# Patient Record
Sex: Female | Born: 1940 | Race: Black or African American | Hispanic: No | Marital: Single | Smoking: Never smoker
Health system: Southern US, Community
[De-identification: ages and names within clinical notes are randomized; demographics above are authoritative.]

## PROBLEM LIST (undated history)

## (undated) DIAGNOSIS — K449 Diaphragmatic hernia without obstruction or gangrene: Secondary | ICD-10-CM

## (undated) DIAGNOSIS — K219 Gastro-esophageal reflux disease without esophagitis: Secondary | ICD-10-CM

## (undated) DIAGNOSIS — R29898 Other symptoms and signs involving the musculoskeletal system: Secondary | ICD-10-CM

## (undated) DIAGNOSIS — I10 Essential (primary) hypertension: Secondary | ICD-10-CM

## (undated) DIAGNOSIS — C8330 Diffuse large B-cell lymphoma, unspecified site: Secondary | ICD-10-CM

## (undated) HISTORY — DX: Diffuse large B-cell lymphoma, unspecified site: C83.30

## (undated) HISTORY — PX: ABDOMINAL HYSTERECTOMY: SHX81

## (undated) HISTORY — DX: Other symptoms and signs involving the musculoskeletal system: R29.898

## (undated) HISTORY — PX: CYST EXCISION: SHX5701

## (undated) HISTORY — PX: CATARACT EXTRACTION: SUR2

---

## 2000-06-10 ENCOUNTER — Encounter (HOSPITAL_BASED_OUTPATIENT_CLINIC_OR_DEPARTMENT_OTHER): Payer: Self-pay | Admitting: General Surgery

## 2000-06-14 ENCOUNTER — Ambulatory Visit (HOSPITAL_COMMUNITY): Admission: RE | Admit: 2000-06-14 | Discharge: 2000-06-14 | Payer: Self-pay | Admitting: General Surgery

## 2000-06-14 ENCOUNTER — Encounter (INDEPENDENT_AMBULATORY_CARE_PROVIDER_SITE_OTHER): Payer: Self-pay | Admitting: Specialist

## 2001-12-11 ENCOUNTER — Encounter: Payer: Self-pay | Admitting: Family Medicine

## 2001-12-11 ENCOUNTER — Encounter: Admission: RE | Admit: 2001-12-11 | Discharge: 2001-12-11 | Payer: Self-pay | Admitting: Family Medicine

## 2002-10-02 ENCOUNTER — Encounter: Payer: Self-pay | Admitting: Family Medicine

## 2002-10-02 ENCOUNTER — Encounter: Admission: RE | Admit: 2002-10-02 | Discharge: 2002-10-02 | Payer: Self-pay | Admitting: Family Medicine

## 2003-04-18 ENCOUNTER — Ambulatory Visit (HOSPITAL_COMMUNITY): Admission: RE | Admit: 2003-04-18 | Discharge: 2003-04-19 | Payer: Self-pay | Admitting: Ophthalmology

## 2008-08-19 ENCOUNTER — Encounter: Admission: RE | Admit: 2008-08-19 | Discharge: 2008-08-19 | Payer: Self-pay | Admitting: Family Medicine

## 2010-05-29 NOTE — H&P (Signed)
NAME:  Ashley West, Ashley West                         ACCOUNT NO.:  [id number]   MEDICAL RECORD NO.:  [id number]                   PATIENT TYPE:  OIB   LOCATION:  5729                                 FACILITY:  [facility]   PHYSICIAN:  Guadelupe Sabin, M.D.             DATE OF BIRTH:  1940/12/02   DATE OF ADMISSION:  04/18/2003  DATE OF DISCHARGE:  04/19/2003                                HISTORY & PHYSICAL   This was an urgent outpatient admission of this 70 year old black female  admitted for retinal detachment surgery, left eye.   HISTORY OF PRESENT ILLNESS:  This patient had uneventful previous cataract  implant surgery performed over 10 years ago by Dr. Caleen Jobs in both  eyes.  The patient had no intraocular lens implant placed at that time.  The  patient did well with aphakic glasses.  The cataract surgery actually was  1972.  Recently over the week prior to admission, the patient noted the loss  of vision in the left eye, and a superior visual field defect.  The patient  was seen by Dr. Loraine Leriche T. Nile Riggs and Dr. Earlie Server and found to have a  retinal detachment of her aphakic left eye.  The patient was referred to my  office where examination confirmed this diagnosis.  Arrangements were made  for her outpatient admission.  The patient was given oral discussion and  printed information concerning the procedure and its possible complications.  She signed an informed consent.   PAST MEDICAL HISTORY:  The patient is under the care of Dr. Elsworth Soho  with:  1. Controlled hypertension.  2. Hiatal hernia.  3. Sinus trouble.  4. Chronic arthritis.  5. Allergies.   CURRENT MEDICATIONS:  1. Toprol.  2. Multivitamins.  3. Vitamin E and B complex.  4. She takes no aspirin regularly.   REVIEW OF SYSTEMS:  No cardiorespiratory complaints.   PHYSICAL EXAMINATION:  VITAL SIGNS:  As recorded on admission.  Blood  pressure 150/82.  Temperature 96.7.  Heart rate 71.  Respirations  18.  GENERAL APPEARANCE:  The patient is a pleasant, well-nourished, well-  developed, 70 year old black female in acute ocular distress.  HEENT:  Eyes:  Visual acuity recorded at 20/30, right eye.  Less than  20/400, left eye.  Slit lamp exam: The eyes are white and clear with a clear  cornea, deep and clear anterior chamber.  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  The left  eye reveals a thickened hyaloid with attachment to the pupillary border.  Two peripheral iridectomies are present in each eye at the 10 o'clock and 2  o'clock positions.  Applanation tonometry 15 mm, right eye, 17, left eye.  Detailed fundus examination, right eye, the vitreous is clear.  The retina  is attached with no retinal tear or retinal detachment surgery seen.  The  pupils of both eyes dilate poorly.  There is slight vitreous asteroid  hyalitis of the left eye.  There is a temporal and inferior retinal  detachment.  Due to the small pupil, it is difficult to see any definite  retinal tears.  The macular area is detached.  CHEST:  Lungs clear to percussion/auscultation.  HEART:  Normal sinus rhythm.  No cardiomegaly.  No murmurs.  ABDOMEN:  Negative.  EXTREMITIES:  Negative.   ADMISSION DIAGNOSES:  1. Rhegmatogenous retinal detachment, left eye.  2. Aphakia, left eye.   SURGICAL PLAN:  Sclerae buckling with possible vitrectomy.   The patient has been given oral discussion and printed information  concerning the procedure and its possible complications.  She signed an  informed consent, and arrangements were made for her outpatient admission at  this time.                                               Guadelupe Sabin, M.D.    HNJ/MEDQ  D:  04/19/2003  T:  04/20/2003  Job:  [id number]   cc:   Loraine Leriche T. Nile Riggs, M.D.  Fax: [phone]   L. Lupe Carney, M.D.  [address] [place]  [place] [address]  Fax: [phone]

## 2010-05-29 NOTE — Op Note (Signed)
[facility] [address]  Patient:    Ashley West, Ashley West                      MRN: [id number] Proc. Date: 06/14/00 Adm. 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  PROCEDURE:  Excision of lipomata:  (1) right elbow; (2) left axilla.  SURGEON:  Mardene Celeste. Lurene Shadow, M.D.  ASSISTANT:  Nurse.  ANESTHESIA:  MAC, 1% Xylocaine with epinephrine.  NOTE:  The patient is a 70 year old woman with enlarging lipomas located at the lateral aspect of the right elbow and at the posterior axillary line in the left axilla.  She is brought to the operating room for excision of these masses.  DESCRIPTION OF PROCEDURE:  Following the induction of satisfactory sedation, the patient is positioned supinely and the right arm and left axilla are prepped and draped to be included in the sterile operative field.  I infiltrated the region of the right elbow with 1% Xylocaine with epinephrine and made a transverse incision across the elbow, deepening this through the skin and subcutaneous tissue, down to the capsule of the lipoma, and the lipoma was dissected free from the surrounding soft tissue and carried all the way down to the fascia, where it was dissected free, removed in its entirety, and forwarded for pathologic evaluation.  Sponge and instrument counts were verified and the subcutaneous tissues closed with interrupted 3-0 Vicryl sutures.  The skin was closed with a running 5-0 Monocryl and reinforced with Steri-Strips.  Attention then turned to the left axilla, where again the region was infiltrated with 1% Xylocaine with epinephrine.  I made a transverse incision over the mass in the axilla, deepened this through the subcutaneous tissue down to the capsule of the mass.  The mass was dissected  free on all sides and removed in its entirety and forwarded for pathologic evaluation.  Hemostasis was assured with electrocautery.  Sponge, instrument, and sharp counts verified and the wound closed in layers with 3-0 Vicryl sutures in the subcutaneous layers and 5-0 Monocryl in the skin.  Both wounds were reinforced with Steri-Strips and sterile dressings applied.  Anesthetic reversed, the patient removed from the operating room to the recovery room in stable condition, having tolerated the procedure well. DD:  06/14/00 TD:  06/14/00 Job: [id number]

## 2010-05-29 NOTE — Discharge Summary (Signed)
NAME:  Ashley West, Ashley West                         ACCOUNT NO.:  [id number]   MEDICAL RECORD NO.:  [id number]                   PATIENT TYPE:  OIB   LOCATION:  5729                                 FACILITY:  [facility]   PHYSICIAN:  Guadelupe Sabin, M.D.             DATE OF BIRTH:  09/14/1940   DATE OF ADMISSION:  04/18/2003  DATE OF DISCHARGE:  04/19/2003                                 DISCHARGE SUMMARY   This was a planned urgent outpatient admission of this 70 year old black  female admitted with a rhegmatogenous retinal detachment of her left eye.  (See detailed admission history and physical.)   HOSPITAL COURSE:  The patient was evaluated preoperatively and felt to be in  satisfactory condition for the proposed surgery.  She, therefore, was taken  into the operating room where a sclerae buckling procedure was performed on  the left eye using solid silicone implants, #277 and #240, with drainage of  subretinal fluid and paracentesis.  The patient was given Diamox 500 mg  intravenously to further lower the intraocular pressure toward the end of  the procedure.  The patient tolerated the procedure well and was taken to  the recovery room and subsequently to the 23-hour Observation Unit.  The  patient was seen on the evening of surgery and felt to be doing well with  stable vital signs.  Slit lamp examination revealed a clear cornea,  moderately deep anterior chamber with the two peripheral iridectomies which  had been previously noted.  Applanation tonometry was elevated at 29 mm.  The patient was placed on Diamox every 4-6 hours and Betoptic ophthalmic  solution every 12 hours.  The patient was again seen on the following  morning, 04/19/2003, at which time slit lamp examination revealed shallowing  of the anterior chamber, iris of bombe', and elevated pressure at 35 mm.  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  It was felt that the patient  needed YAG laser opening of the thickened hyaloid to allow better  circulation of the intraocular fluids and opening of the angle.  The patient  was discharged to return to the office following her discharge from the  hospital for YAG laser surgery.   DISCHARGE DIAGNOSES:  1. Rhegmatogenous retinal detachment, left eye.  2. Pseudophakia, both eyes.  3. Postoperative acute pupillary block glaucoma.                                                Guadelupe Sabin, M.D.    HNJ/MEDQ  D:  04/19/2003  T:  04/20/2003  Job:  [id number]

## 2010-05-29 NOTE — Op Note (Signed)
NAME:  Ashley, West                         ACCOUNT NO.:  [id number]   MEDICAL RECORD NO.:  [id number]                   PATIENT TYPE:  OIB   LOCATION:  5729                                 FACILITY:  [facility]   PHYSICIAN:  Guadelupe Sabin, M.D.             DATE OF BIRTH:  November 22, 1940   DATE OF PROCEDURE:  04/18/2003  DATE OF DISCHARGE:  04/19/2003                                 OPERATIVE REPORT   PREOPERATIVE DIAGNOSES:  1. Rhegmatogenous retinal detachment, left eye.  2. Aphakia, left eye.   POSTOPERATIVE DIAGNOSES:  1. Rhegmatogenous retinal detachment, left eye.  2. Aphakia, left eye.   NAME OF OPERATION:  Scleral buckling procedure, left eye, using solid  silicone implants, #277 and #240, with diathermy application, external  drainage of subretinal fluid, and paracentesis.   SURGEON:  Guadelupe Sabin, M.D.   ASSISTANT:  Nurse.   ANESTHESIA:  General.   OPTHALMOSCOPY:  As previous described.  Ophthalmoscopy was difficult in this  patient due to the thickened hyaloid to the iris border which prevented  pupillary dilation.   OPERATIVE PROCEDURE:  The patient was given Diamox 500 mg intravenously at  the beginning of the operative procedure.  A peritomy was performed, 360  degrees adjacent to the limbus.  The subconjunctival tissue was cleaned.  The rectus muscles were looped with 4-0 silk traction sutures.  Using  indirect ophthalmoscopy and scleral depression, the retinal detached area  was noted from the 1:30 o'clock to 6 o'clock position.  It was elected,  however, to performed Lamellar's scleral dissection from the 12 o'clock to  7:30 o'clock position. The bed measured 9 mm in width.  Light diathermy  applications were applied to the inner sclerae lamella.  A #277 solid  silicone implant was trimmed and placed in the dissected bed from the 12  o'clock to 7:30 o'clock position.  A #240 solid silicone encircling band was  placed about the globe, tied with a suture  at the 10 o'clock position and an  anchoring suture at the 10:30 position.  After repeat indirect  ophthalmoscopy, it was noted that there was relatively little subretinal  fluid.  It was elected, however, to drain fluid at the 4 o'clock position.  The  corroid was exposed through a small slit incision in the inner sclerae  lamella.  It was treated with light diathermy and then perforated with the  pin electrode.  There was a small amount of subretinal fluid drainage.  The  sutures #5, 4-0 green Mersilene sutures, were pulled up covering the sclerae  implant.  The tension of the encircling band was adjusted.  As the sclerae  flaps were pulled up, the interocular pressure elevated, and paracentesis  was performed on two occasions to lower the interocular pressure.  The optic  nerve was intermittently examined to assure perfusion of the retina and  retinal blood vessels.  There did not appear to  be any retinal artery or  vein closure.  After waiting a few minutes for the pressure to be  stabilized, it was elected to close.  The sclerae flaps were secured  permanently.  The fundus was reinspected revealing good retinal artery  perfusion and flattening of the retina with a good buckling indentation on  the temporal side.  Tenon's capsule was pulled forward in the four quadrants  and tied as a separate layer.  The conjunctivae was then pulled forward and  closed with a running 6-0 chromic catgut suture.  Neosporin ophthalmic  solution had been irrigated over the implant and lamellar scleral dissected  area.  Depo-Garamycin and Depo-dexamethasone were injected in the subTenon's  space inferiorly.  Maxitrol and atropine ointment were instilled in the  conjunctival cul-de-sac.  A light patch and protective shield were applied.   DURATION OF PROCEDURE:  1-1/2 to 2 hours.   The patient tolerated the procedure well in general, left the operating room  for the recovery room in good condition.                                                Guadelupe Sabin, M.D.    HNJ/MEDQ  D:  04/19/2003  T:  04/20/2003  Job:  [id number]

## 2011-03-18 ENCOUNTER — Other Ambulatory Visit: Payer: Self-pay | Admitting: Family Medicine

## 2011-03-18 DIAGNOSIS — M546 Pain in thoracic spine: Secondary | ICD-10-CM

## 2011-03-18 DIAGNOSIS — R1012 Left upper quadrant pain: Secondary | ICD-10-CM

## 2011-03-19 ENCOUNTER — Ambulatory Visit
Admission: RE | Admit: 2011-03-19 | Discharge: 2011-03-19 | Disposition: A | Discharge status: Home / Self Care | Payer: PRIVATE HEALTH INSURANCE | Source: Ambulatory Visit | Attending: Family Medicine | Admitting: Family Medicine

## 2011-03-19 DIAGNOSIS — M546 Pain in thoracic spine: Secondary | ICD-10-CM

## 2011-03-19 DIAGNOSIS — R1012 Left upper quadrant pain: Secondary | ICD-10-CM

## 2011-03-19 MED ORDER — IOHEXOL 300 MG/ML  SOLN: 75.0000 mL | Freq: Once | INTRAMUSCULAR | Status: DC | PRN

## 2011-03-19 MED ORDER — IOHEXOL 300 MG/ML  SOLN
100.0000 mL | Freq: Once | INTRAMUSCULAR | Status: AC | PRN
  Administered 2011-03-19: 100 mL via INTRAVENOUS

## 2012-04-19 ENCOUNTER — Inpatient Hospital Stay (HOSPITAL_COMMUNITY)
Admission: EM | Admit: 2012-04-19 | Discharge: 2012-04-23 | DRG: 378 | Disposition: A | Discharge status: Home / Self Care | Payer: PRIVATE HEALTH INSURANCE | Attending: Internal Medicine | Admitting: Internal Medicine

## 2012-04-19 ENCOUNTER — Encounter (HOSPITAL_COMMUNITY): Payer: Self-pay | Admitting: Family Medicine

## 2012-04-19 DIAGNOSIS — K222 Esophageal obstruction: Secondary | ICD-10-CM | POA: Diagnosis present

## 2012-04-19 DIAGNOSIS — F1010 Alcohol abuse, uncomplicated: Secondary | ICD-10-CM | POA: Diagnosis present

## 2012-04-19 DIAGNOSIS — K449 Diaphragmatic hernia without obstruction or gangrene: Secondary | ICD-10-CM | POA: Diagnosis present

## 2012-04-19 DIAGNOSIS — K922 Gastrointestinal hemorrhage, unspecified: Secondary | ICD-10-CM | POA: Diagnosis present

## 2012-04-19 DIAGNOSIS — Z7982 Long term (current) use of aspirin: Secondary | ICD-10-CM

## 2012-04-19 DIAGNOSIS — K5731 Diverticulosis of large intestine without perforation or abscess with bleeding: Principal | ICD-10-CM | POA: Diagnosis present

## 2012-04-19 DIAGNOSIS — D62 Acute posthemorrhagic anemia: Secondary | ICD-10-CM | POA: Diagnosis present

## 2012-04-19 DIAGNOSIS — D649 Anemia, unspecified: Secondary | ICD-10-CM

## 2012-04-19 DIAGNOSIS — I10 Essential (primary) hypertension: Secondary | ICD-10-CM | POA: Diagnosis present

## 2012-04-19 DIAGNOSIS — Z79899 Other long term (current) drug therapy: Secondary | ICD-10-CM

## 2012-04-19 DIAGNOSIS — K219 Gastro-esophageal reflux disease without esophagitis: Secondary | ICD-10-CM | POA: Diagnosis present

## 2012-04-19 HISTORY — DX: Essential (primary) hypertension: I10

## 2012-04-19 LAB — COMPREHENSIVE METABOLIC PANEL
Alkaline Phosphatase: 63 U/L (ref 39–117)
BUN: 14 mg/dL (ref 6–23)
Calcium: 10.3 mg/dL (ref 8.4–10.5)
Creatinine, Ser: 0.81 mg/dL (ref 0.50–1.10)
GFR calc Af Amer: 83 mL/min — ABNORMAL LOW (ref >90)
Glucose, Bld: 139 mg/dL — ABNORMAL HIGH (ref 70–99)
Total Protein: 6.4 g/dL (ref 6.0–8.3)

## 2012-04-19 LAB — CBC
HCT: 30.5 % — ABNORMAL LOW (ref 36.0–46.0)
Hemoglobin: 10.6 g/dL — ABNORMAL LOW (ref 12.0–15.0)
MCH: 29 pg (ref 26.0–34.0)
MCHC: 34.8 g/dL (ref 30.0–36.0)
MCV: 83.3 fL (ref 78.0–100.0)
RDW: 14.7 % (ref 11.5–15.5)

## 2012-04-19 LAB — OCCULT BLOOD, POC DEVICE: Fecal Occult Bld: POSITIVE — AB

## 2012-04-19 MED ORDER — SODIUM CHLORIDE 0.9 % IV SOLN
INTRAVENOUS | Status: DC
  Administered 2012-04-19: 20:00:00 via INTRAVENOUS
  Administered 2012-04-20: 1000 mL via INTRAVENOUS
  Administered 2012-04-21: 01:00:00 via INTRAVENOUS

## 2012-04-19 MED ORDER — ACETAMINOPHEN 325 MG PO TABS: 650.0000 mg | ORAL_TABLET | Freq: Four times a day (QID) | ORAL | Status: DC | PRN

## 2012-04-19 MED ORDER — SODIUM CHLORIDE 0.9 % IV SOLN
INTRAVENOUS | Status: AC
  Administered 2012-04-19: 17:00:00 via INTRAVENOUS

## 2012-04-19 MED ORDER — LORAZEPAM 1 MG PO TABS: 1.0000 mg | ORAL_TABLET | Freq: Four times a day (QID) | ORAL | Status: AC | PRN

## 2012-04-19 MED ORDER — LORAZEPAM 2 MG/ML IJ SOLN: 1.0000 mg | Freq: Four times a day (QID) | INTRAMUSCULAR | Status: AC | PRN

## 2012-04-19 MED ORDER — ACETAMINOPHEN 650 MG RE SUPP: 650.0000 mg | Freq: Four times a day (QID) | RECTAL | Status: DC | PRN

## 2012-04-19 MED ORDER — PANTOPRAZOLE SODIUM 40 MG IV SOLR
40.0000 mg | Freq: Two times a day (BID) | INTRAVENOUS | Status: DC
  Administered 2012-04-19: 40 mg via INTRAVENOUS
  Filled 2012-04-19 (×3): qty 40

## 2012-04-19 MED ORDER — HYDROMORPHONE HCL PF 1 MG/ML IJ SOLN: 1.0000 mg | INTRAMUSCULAR | Status: DC | PRN

## 2012-04-19 MED ORDER — THIAMINE HCL 100 MG/ML IJ SOLN
100.0000 mg | Freq: Every day | INTRAMUSCULAR | Status: DC
  Filled 2012-04-19 (×5): qty 1

## 2012-04-19 MED ORDER — ADULT MULTIVITAMIN W/MINERALS CH
1.0000 | ORAL_TABLET | Freq: Every day | ORAL | Status: DC
  Administered 2012-04-19 – 2012-04-23 (×4): 1 via ORAL
  Filled 2012-04-19 (×5): qty 1

## 2012-04-19 MED ORDER — ONDANSETRON HCL 4 MG PO TABS: 4.0000 mg | ORAL_TABLET | Freq: Four times a day (QID) | ORAL | Status: DC | PRN

## 2012-04-19 MED ORDER — ONDANSETRON HCL 4 MG/2ML IJ SOLN: 4.0000 mg | Freq: Four times a day (QID) | INTRAMUSCULAR | Status: DC | PRN

## 2012-04-19 MED ORDER — ALUM & MAG HYDROXIDE-SIMETH 200-200-20 MG/5ML PO SUSP: 30.0000 mL | Freq: Four times a day (QID) | ORAL | Status: DC | PRN

## 2012-04-19 MED ORDER — VITAMIN B-1 100 MG PO TABS
100.0000 mg | ORAL_TABLET | Freq: Every day | ORAL | Status: DC
  Administered 2012-04-19 – 2012-04-23 (×4): 100 mg via ORAL
  Filled 2012-04-19 (×5): qty 1

## 2012-04-19 MED ORDER — LABETALOL HCL 5 MG/ML IV SOLN
10.0000 mg | Freq: Four times a day (QID) | INTRAVENOUS | Status: DC | PRN
  Filled 2012-04-19: qty 4

## 2012-04-19 MED ORDER — FOLIC ACID 1 MG PO TABS
1.0000 mg | ORAL_TABLET | Freq: Every day | ORAL | Status: DC
  Administered 2012-04-19 – 2012-04-23 (×4): 1 mg via ORAL
  Filled 2012-04-19 (×5): qty 1

## 2012-04-19 MED ORDER — SODIUM CHLORIDE 0.9 % IJ SOLN
3.0000 mL | Freq: Two times a day (BID) | INTRAMUSCULAR | Status: DC
  Administered 2012-04-21 – 2012-04-23 (×3): 3 mL via INTRAVENOUS

## 2012-04-19 MED ORDER — ONDANSETRON HCL 4 MG/2ML IJ SOLN: 4.0000 mg | Freq: Three times a day (TID) | INTRAMUSCULAR | Status: DC | PRN

## 2012-04-19 NOTE — ED Notes (Signed)
IV team unable to gain access. MD paged.

## 2012-04-19 NOTE — ED Provider Notes (Signed)
  I performed a history and physical examination of Ashley West and discussed her management with [name], PA_C.  I agree with the history, physical, assessment, and plan of care, with the following exceptions: None  I was present for the following procedures: None Time Spent in Critical Care of the patient: None Time spent in discussions with the patient and family: [age] y.o female with multiple episodes of rectal bleeding today.  Seen at clinic and noted to have decreased vital signs and told to come to ed.  Rectal positive for gross blood.  Patient hemodynamically stable and will be admitted to medicine with [facility] to consult.  [name], MD 04/19/12 [phone]

## 2012-04-19 NOTE — ED Notes (Signed)
Pt c/o bleeding when she has BM, started around 5am today, reports dark thick red bld. Pt think she has had hemorrhoids in the past, but sts she went to PCP and said he didn't see any external hemorrhoids. Pt denies n/v/d/pain. Pt in nad, ambulated to room with no issues, skin warm and dry, resp e/u.

## 2012-04-19 NOTE — Consult Note (Signed)
Referring Provider: Dr. Rod Can Primary Care Physician:  Benita Stabile, MD Primary Gastroenterologist:  Dr. Evette Cristal  Reason for Consultation:  GI bleed  HPI: Ashley West is a 72 y.o. female admitted through the emergency room this afternoon because of 5 episodes of painless hematochezia today, without significant hemodynamic instability. The stools were apparently somewhat maroon in color, not melenic.  There is moderate posthemorrhagic anemia with an initial hemoglobin of 10.6.   Previous CT scanning a year ago showed extensive diverticulosis.   She has never had a colonoscopy. (She was seen in the past by Dr. Evette Cristal and a colonoscopy was arranged, but she canceled it.)  The patient is on a daily 81 mg aspirin but does not have any chronic dyspeptic symptomatology. She has never had an ulcer.   Past Medical History  Diagnosis Date  . Hypertension     History reviewed. No pertinent past surgical history.  Prior to Admission medications   Medication Sig Start Date End Date Taking? Authorizing Provider  aspirin 81 MG chewable tablet Chew 81 mg by mouth daily.   Yes Historical Provider, MD  b complex vitamins tablet Take 1 tablet by mouth daily.   Yes Historical Provider, MD  losartan-hydrochlorothiazide (HYZAAR) 50-12.5 MG per tablet Take 1 tablet by mouth daily.   Yes Historical Provider, MD  Multiple Vitamin (MULTIVITAMIN WITH MINERALS) TABS Take 1 tablet by mouth daily.   Yes Historical Provider, MD  potassium gluconate 595 MG TABS Take 1,190 mg by mouth. Takes 2 tablets @ 99mg  equivalent each.   Yes Historical Provider, MD  vitamin C (ASCORBIC ACID) 250 MG tablet Take 250 mg by mouth daily.   Yes Historical Provider, MD  vitamin E (VITAMIN E) 400 UNIT capsule Take 400 Units by mouth daily.   Yes Historical Provider, MD    Current Facility-Administered Medications  Medication Dose Route Frequency Provider Last Rate Last Dose  . 0.9 %  sodium chloride infusion   Intravenous  STAT Hannah Muthersbaugh, PA-C 20 mL/hr at 04/19/12 1716    . 0.9 %  sodium chloride infusion   Intravenous Continuous Ripudeep Jenna Luo, MD 75 mL/hr at 04/19/12 1937    . acetaminophen (TYLENOL) tablet 650 mg  650 mg Oral Q6H PRN Ripudeep Jenna Luo, MD       Or  . acetaminophen (TYLENOL) suppository 650 mg  650 mg Rectal Q6H PRN Ripudeep Jenna Luo, MD      . alum & mag hydroxide-simeth (MAALOX/MYLANTA) 200-200-20 MG/5ML suspension 30 mL  30 mL Oral Q6H PRN Ripudeep K Rai, MD      . folic acid (FOLVITE) tablet 1 mg  1 mg Oral Daily Ripudeep K Rai, MD      . HYDROmorphone (DILAUDID) injection 1 mg  1 mg Intravenous Q4H PRN Ripudeep K Rai, MD      . labetalol (NORMODYNE,TRANDATE) injection 10 mg  10 mg Intravenous Q6H PRN Ripudeep K Rai, MD      . LORazepam (ATIVAN) tablet 1 mg  1 mg Oral Q6H PRN Ripudeep Jenna Luo, MD       Or  . LORazepam (ATIVAN) injection 1 mg  1 mg Intravenous Q6H PRN Ripudeep K Rai, MD      . multivitamin with minerals tablet 1 tablet  1 tablet Oral Daily Ripudeep K Rai, MD      . ondansetron (ZOFRAN) tablet 4 mg  4 mg Oral Q6H PRN Ripudeep Jenna Luo, MD       Or  . ondansetron (  ZOFRAN) injection 4 mg  4 mg Intravenous Q6H PRN Ripudeep K Rai, MD      . pantoprazole (PROTONIX) injection 40 mg  40 mg Intravenous Q12H Ripudeep K Rai, MD      . sodium chloride 0.9 % injection 3 mL  3 mL Intravenous Q12H Ripudeep K Rai, MD      . thiamine (VITAMIN B-1) tablet 100 mg  100 mg Oral Daily Ripudeep K Rai, MD       Or  . thiamine (B-1) injection 100 mg  100 mg Intravenous Daily Ripudeep Jenna Luo, MD        Allergies as of 04/19/2012  . (No Known Allergies)    History reviewed. No pertinent family history.  History   Social History  . Marital Status: Single    Spouse Name: N/A    Number of Children: N/A  . Years of Education: N/A   Occupational History  . Not on file.   Social History Main Topics  . Smoking status: Never Smoker   . Smokeless tobacco: Not on file  . Alcohol Use: Yes      Comment: everyday  . Drug Use: Not on file  . Sexually Active: Not on file   Other Topics Concern  . Not on file   Social History Narrative  . No narrative on file    Review of Systems: See history of present illness. Negative for anorexia or weight loss, in fact, has gained weight over the past year. She runs chronically constipated, with infrequent bowel movements. She does resort to occasional laxatives.   Physical Exam: Vital signs in last 24 hours: Temp:  [98 F (36.7 C)-98.3 F (36.8 C)] 98.3 F (36.8 C) (04/09 1957) Pulse Rate:  [68-101] 74 (04/09 1957) Resp:  [14-23] 20 (04/09 1957) BP: (128-182)/(52-74) 150/57 mmHg (04/09 1957) SpO2:  [98 %-100 %] 100 % (04/09 1957) Weight:  [61.2 kg (134 lb 14.7 oz)] 61.2 kg (134 lb 14.7 oz) (04/09 1848)   General:   Alert,  Well-developed, well-nourished, pleasant and cooperative in NAD Head:  Normocephalic and atraumatic. Eyes:  Sclera clear, no icterus.   Conjunctiva pale. Mouth:   No ulcerations or lesions.  Oropharynx pink & moist. Neck:   No masses or thyromegaly. Lungs:  Clear throughout to auscultation.   No wheezes, crackles, or rhonchi. No evident respiratory distress. Heart:   Regular rate and rhythm; no murmurs, clicks, rubs,  or gallops. Abdomen:  Soft, nontender, nontympanitic, and nondistended. No masses, hepatosplenomegaly or ventral hernias noted. Normal bowel sounds, without bruits, guarding, or rebound.   Msk:   Symmetrical without gross deformities. Extremities:   Without clubbing, cyanosis, or edema. Neurologic:  Alert and coherent;  grossly normal neurologically. Skin:  Intact without significant lesions or rashes. Cervical Nodes:  No significant cervical adenopathy. Psych:   Alert and cooperative. Normal mood and affect.  Intake/Output from previous day:   Intake/Output this shift: Total I/O In: 47 [I.V.:47] Out: -   Lab Results:  Recent Labs  04/19/12 1345  WBC 7.2  HGB 10.6*  HCT 30.5*  PLT  283   BMET  Recent Labs  04/19/12 1345  NA 132*  K 5.0  CL 99  CO2 28  GLUCOSE 139*  BUN 14  CREATININE 0.81  CALCIUM 10.3   LFT  Recent Labs  04/19/12 1345  PROT 6.4  ALBUMIN 3.5  AST 18  ALT 14  ALKPHOS 63  BILITOT 0.4   PT/INR No results found for this  basename: LABPROT, INR,  in the last 72 hours Studies/Results: No results found.  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  Plan: 1. Supportive care  2. I would favor endoscopic evaluation to confirm the absence of an upper tract source of bleeding, since she is on aspirin. The nature, purpose, and risks of the procedure were reviewed with the patient and her husband. However, at the moment, the patient is not sure if she wants the examination, so we will keep her n.p.o. after midnight and review that decision in the morning. 3. Fairly lengthy discussion with patient and her husband regarding the prognosis, causation, and natural history of diverticular bleeding, which they understand is our working diagnosis but not confirmed. 4. At some point, the patient should have colonoscopy. If she stops bleeding in the next couple of days, it would be prudent to defer that to an outpatient status, although based on her past track record, it is unclear whether she would comply with followup. If the bleeding persists, in-house colonoscopy would be worth considering, looking for a correctable cause of bleeding such as colonic ulcerations from her aspirin therapy, or vascular ectasia.   LOS: 0 days   Kanchan Gal V  04/19/2012, 8:03 PM

## 2012-04-19 NOTE — ED Notes (Signed)
Per Dr. Isidoro Donning pt is okay with just 1 IV.

## 2012-04-19 NOTE — ED Notes (Signed)
Paged IV team 

## 2012-04-19 NOTE — Progress Notes (Signed)
Pt arrived to unit via stretcher, IV.  Report received from [place], [place].  Salomon Mast, RN

## 2012-04-19 NOTE — ED Provider Notes (Signed)
History     CSN: [id number]  Arrival date & time 04/19/12  1231   First MD Initiated Contact with Patient 04/19/12 [phone]      Chief Complaint  Patient presents with  . Rectal Bleeding    (Consider location/radiation/quality/duration/timing/severity/associated sxs/prior treatment) Patient is a 72 y.o. female presenting with hematochezia. The history is provided by the patient, medical records and the spouse.  Rectal Bleeding  The current episode started today. The onset was sudden. The problem occurs occasionally. The problem has been gradually worsening. The patient is experiencing no pain. The stool is described as bloody. Pertinent negatives include no anorexia, no fever, no abdominal pain, no diarrhea, no hemorrhoids, no nausea, no rectal pain, no vomiting, no hematuria, no vaginal bleeding, no chest pain, no headaches, no coughing and no rash. She has been behaving normally. She has been eating and drinking normally. Urine output has been normal. The last void occurred less than 6 hours ago. Her past medical history does not include Hirschsprung's disease, inflammatory bowel disease or recent abdominal injury. There were no sick contacts. Recently, medical care has been given by the PCP. Services received include one or more referrals.    Ashley West is a 72 y.o. female  with a hx of HTN, hiatal hernia presents to the Emergency Department complaining of gradual, persistent, progressively worsening rectal bleeding onset 5 am. Pt constipated since Monday.  Saw PCP this AM who made an appointment with GI, but she continued to have bleeding and presented to the ER.  Associated symptoms include weakness, lightheaded.  Pr denies pain with BM.  Pt has never has a colonoscopt.  Nothing makes it better and nothing makes it worse. Pt has had a total of 5 BMs, all bloody.  Initially with BRB, but now dark red with clots. Pt denies fever, chills, headache, neck pain, chest pain, abdominal pain, nausea,  vomiting, syncope, dysuria.     Past Medical History  Diagnosis Date  . Hypertension     History reviewed. No pertinent past surgical history.  History reviewed. No pertinent family history.  History  Substance Use Topics  . Smoking status: Never Smoker   . Smokeless tobacco: Not on file  . Alcohol Use: Yes     Comment: everyday    OB History   Grav Para Term Preterm Abortions TAB SAB Ect Mult Living                  Review of Systems  Constitutional: Negative for fever, diaphoresis, appetite change, fatigue and unexpected weight change.  HENT: Negative for mouth sores and neck stiffness.   Eyes: Negative for visual disturbance.  Respiratory: Negative for cough, chest tightness, shortness of breath and wheezing.   Cardiovascular: Negative for chest pain.  Gastrointestinal: Positive for blood in stool, hematochezia and anal bleeding. Negative for nausea, vomiting, abdominal pain, diarrhea, constipation, rectal pain, anorexia and hemorrhoids.  Endocrine: Negative for polydipsia, polyphagia and polyuria.  Genitourinary: Negative for dysuria, urgency, frequency, hematuria and vaginal bleeding.  Musculoskeletal: Negative for back pain.  Skin: Negative for rash.  Allergic/Immunologic: Negative for immunocompromised state.  Neurological: Negative for syncope, light-headedness and headaches.  Hematological: Does not bruise/bleed easily.  Psychiatric/Behavioral: Negative for sleep disturbance. The patient is not nervous/anxious.     Allergies  Review of patient's allergies indicates no known allergies.  Home Medications   Current Outpatient Rx  Name  Route  Sig  Dispense  Refill  . aspirin 81 MG chewable tablet  Oral   Chew 81 mg by mouth daily.         Marland Kitchen b complex vitamins tablet   Oral   Take 1 tablet by mouth daily.         Marland Kitchen losartan-hydrochlorothiazide (HYZAAR) 50-12.5 MG per tablet   Oral   Take 1 tablet by mouth daily.         . Multiple Vitamin  (MULTIVITAMIN WITH MINERALS) TABS   Oral   Take 1 tablet by mouth daily.         . potassium gluconate 595 MG TABS   Oral   Take 1,190 mg by mouth. Takes 2 tablets @ 99mg  equivalent each.         . vitamin C (ASCORBIC ACID) 250 MG tablet   Oral   Take 250 mg by mouth daily.         . vitamin E (VITAMIN E) 400 UNIT capsule   Oral   Take 400 Units by mouth daily.           BP 166/62  Pulse 101  Temp(Src) 98.3 F (36.8 C)  Resp 18  SpO2 98%  Physical Exam  Nursing note and vitals reviewed. Constitutional: She is oriented to person, place, and time. She appears well-developed and well-nourished. No distress.  HENT:  Head: Normocephalic and atraumatic.  Mouth/Throat: Oropharynx is clear and moist. No oropharyngeal exudate.  Eyes: Conjunctivae and EOM are normal. Pupils are equal, round, and reactive to light. No scleral icterus.  Neck: Normal range of motion. Neck supple.  Cardiovascular: Regular rhythm, S1 normal, S2 normal, normal heart sounds and intact distal pulses.  Tachycardia present.  Exam reveals no gallop and no friction rub.   No murmur heard. Pulses:      Radial pulses are 2+ on the right side, and 2+ on the left side.  Pulmonary/Chest: Effort normal and breath sounds normal. No respiratory distress. She has no wheezes. She has no rales. She exhibits no tenderness.  Abdominal: Soft. Bowel sounds are normal. She exhibits no distension and no mass. There is no tenderness. There is no rebound and no guarding.  Genitourinary: Rectal exam shows no external hemorrhoid, no internal hemorrhoid, no fissure, no mass, no tenderness and anal tone normal. Guaiac positive stool.  Musculoskeletal: Normal range of motion. She exhibits no edema and no tenderness.  Lymphadenopathy:    She has no cervical adenopathy.  Neurological: She is alert and oriented to person, place, and time. She exhibits normal muscle tone. Coordination normal.  Speech is clear and goal  oriented Moves extremities without ataxia  Skin: Skin is warm and dry. No rash noted. She is not diaphoretic. No erythema.  Psychiatric: She has a normal mood and affect.    ED Course  Procedures (including critical care time)  Labs Reviewed  CBC - Abnormal; Notable for the following:    RBC 3.66 (*)    Hemoglobin 10.6 (*)    HCT 30.5 (*)    All other components within normal limits  COMPREHENSIVE METABOLIC PANEL - Abnormal; Notable for the following:    Sodium 132 (*)    Glucose, Bld 139 (*)    GFR calc non Af Amer 72 (*)    GFR calc Af Amer 83 (*)    All other components within normal limits  OCCULT BLOOD, POC DEVICE - Abnormal; Notable for the following:    Fecal Occult Bld POSITIVE (*)    All other components within normal limits  TYPE AND  SCREEN  ABO/RH   No results found.   1. Lower GI bleed   2. Anemia       MDM  Ashley West presents with painless BRBPR.  Likely diverticulosis, but also considering diverticulitis, hemorrhoids, neoplasm, angiodysplasia of the colon and ischemic colitis.  Pt with mild anemia of 10.6, no leukocytosis.  Fecal occult grossly positive.  On Record review, CT abdomen on 03/19/2011 with Extensive colonic diverticulosis, without associated inflammatory changes.  VSS, tachycardic, pt alert oriented, NAD, nontoxic, nonseptic appearing.  Pt with persistent bleeding, anemia; will proceed with admission.  Dr. Margarita Grizzle was consulted, evaluated this patient with me and agrees with the plan.          Ashley Client Ausar Georgiou, PA-C 04/19/12 1721

## 2012-04-19 NOTE — H&P (Signed)
History and Physical       Hospital Admission Note Date: 04/19/2012  Patient name: Ashley West Medical record number: [id number] Date of birth: [birth year] Age: 72 y.o. Gender: female PCP: Benita Stabile, MD    Chief Complaint:  Rectal bleeding since morning  HPI: Patient is a 72 year old female with history of hypertension, hiatal hernia presented to ED for rectal bleeding that started this morning. History was obtained from the patient who stated that she woke up at 5 AM in the morning to use the bathroom and saw large amount of fresh blood in the toilet. She denied any abdominal pain, nausea, vomiting or any hematemesis. She denied any dizziness, lightheadedness or any syncopal episode. The patient subsequently had 2 more episodes of rectal bleeding. She went to see her PCP, Dr. Clovis Riley at 8:30 AM in the office. Dr. Clovis Riley arranged the GI appointment at 4 PM with Dr. Evette Cristal today but advised her to go to the ER if she has any more of the bleeding. Patient went home and had another episode of bleeding and subsequently presented to the ED and had another episode while waiting in ED. In total she had 5 episodes of rectal bleeding today. She described it as BRBPR in the morning however during the day became darker with clots. She takes baby aspirin every day however denies any NSAID use. In ED hemoglobin is 10.6 with hematocrit of 30.5, baseline Hb is 13.1 (Dr Marjorie Smolder looked up [place] records to find baseline Hb), currently stable but tachycardiac.     Review of Systems:  Constitutional: Denies fever, chills, diaphoresis, appetite change and fatigue.  HEENT: Denies photophobia, eye pain, redness, hearing loss, ear pain, congestion, sore throat, rhinorrhea, sneezing, mouth sores, trouble swallowing, neck pain, neck stiffness and tinnitus.   Respiratory: Denies SOB, DOE, cough, chest tightness,  and wheezing.   Cardiovascular: Denies chest  pain, palpitations and leg swelling.  Gastrointestinal:see HPI Genitourinary: Denies dysuria, urgency, frequency, hematuria, flank pain and difficulty urinating.  Musculoskeletal: Denies myalgias, back pain, joint swelling, arthralgias and gait problem.  Skin: Denies pallor, rash and wound.  Neurological: Denies dizziness, seizures, syncope, weakness, light-headedness, numbness and headaches.  Hematological: Denies adenopathy. Easy bruising, personal or family bleeding history  Psychiatric/Behavioral: Denies suicidal ideation, mood changes, confusion, nervousness, sleep disturbance and agitation  Past Medical History: Past Medical History  Diagnosis Date  . Hypertension    History reviewed. No pertinent past surgical history.  Medications: Prior to Admission medications   Medication Sig Start Date End Date Taking? Authorizing Provider  aspirin 81 MG chewable tablet Chew 81 mg by mouth daily.   Yes Historical Provider, MD  b complex vitamins tablet Take 1 tablet by mouth daily.   Yes Historical Provider, MD  losartan-hydrochlorothiazide (HYZAAR) 50-12.5 MG per tablet Take 1 tablet by mouth daily.   Yes Historical Provider, MD  Multiple Vitamin (MULTIVITAMIN WITH MINERALS) TABS Take 1 tablet by mouth daily.   Yes Historical Provider, MD  potassium gluconate 595 MG TABS Take 1,190 mg by mouth. Takes 2 tablets @ 99mg  equivalent each.   Yes Historical Provider, MD  vitamin C (ASCORBIC ACID) 250 MG tablet Take 250 mg by mouth daily.   Yes Historical Provider, MD  vitamin E (VITAMIN E) 400 UNIT capsule Take 400 Units by mouth daily.   Yes Historical Provider, MD    Allergies:  No Known Allergies  Social History:  reports that she has never smoked. She does not have any smokeless tobacco history on file.  She reports that  drinks alcohol. Her drug history is not on file. patient states that she drinks 3 beers every day.  Family History: History reviewed. No pertinent family  history.  Physical Exam: Blood pressure 166/62, pulse 101, temperature 98.3 F (36.8 C), resp. rate 18, SpO2 98.00%. General: Alert, awake, oriented x3, in no acute distress. HEENT: normocephalic, atraumatic, anicteric sclera, pink conjunctiva, pupils equal and reactive to light and accomodation, oropharynx clear Neck: supple, no masses or lymphadenopathy, no goiter, no bruits  Heart: Regular rate and rhythm, without murmurs, rubs or gallops. Lungs: Clear to auscultation bilaterally, no wheezing, rales or rhonchi. Abdomen: Soft, nontender, nondistended, positive bowel sounds, no masses. Extremities: No clubbing, cyanosis or edema with positive pedal pulses. Neuro: Grossly intact, no focal neurological deficits, strength 5/5 upper and lower extremities bilaterally Psych: alert and oriented x 3, normal mood and affect Skin: no rashes or lesions, warm and dry   LABS on Admission:  Basic Metabolic Panel:  Recent Labs Lab 04/19/12 1345  NA 132*  K 5.0  CL 99  CO2 28  GLUCOSE 139*  BUN 14  CREATININE 0.81  CALCIUM 10.3   Liver Function Tests:  Recent Labs Lab 04/19/12 1345  AST 18  ALT 14  ALKPHOS 63  BILITOT 0.4  PROT 6.4  ALBUMIN 3.5   No results found for this basename: LIPASE, AMYLASE,  in the last 168 hours No results found for this basename: AMMONIA,  in the last 168 hours CBC:  Recent Labs Lab 04/19/12 1345  WBC 7.2  HGB 10.6*  HCT 30.5*  MCV 83.3  PLT 283    Radiological Exams on Admission: No results found.  Assessment/Plan Principal Problem:   Anemia secondary to acute blood loss/GI bleed: Likely lower GI bleed due to diverticulosis however patient has a history of aspirin use. No fevers, abdominal pain, leukocytosis.  - Admit to step down due to active bleeding, H&H every 8 hours, type and screen, transfuse packed RBCs if Hb less than 8. - Patient had a CT abdomen done in 3/13 which had shown extensive colonic diverticulosis.  - Placed on N.p.o  status, IV fluids - GI consultation called and discussed with Dr. Marjorie Smolder, will evaluate patient in ED for further recommendations.  Active Problems:   HTN (hypertension) - Placed on labetalol PRN    alcohol abuse: Patient drinks 3 beers every night - Placed on CIWA scale and monitor for any acute withdrawals  DVT prophylaxis: SCDs  CODE STATUS: Full CODE STATUS  Further plan will depend as patient's clinical course evolves and further radiologic and laboratory data become available.   Time Spent on Admission: 1 hour  Kieryn Burtis M.D. [facility] 04/19/2012, 4:57 PM Pager: [phone]  If 7PM-7AM, please contact night-coverage [email or web address] Password [email or web address]

## 2012-04-19 NOTE — ED Notes (Signed)
Per pt rectal bleeding for a few days. sts bright red and dark. Significant amount. Denies abdominal pain, N,V. Denies blood thinners. sts weak

## 2012-04-19 NOTE — ED Notes (Signed)
Attempted to gain IV access unsuccessful, another RN will attempt.

## 2012-04-19 NOTE — ED Notes (Signed)
IV team at bedside 

## 2012-04-20 ENCOUNTER — Encounter (HOSPITAL_COMMUNITY)
Admission: EM | Disposition: A | Discharge status: Home / Self Care | Payer: Self-pay | Source: Home / Self Care | Attending: Internal Medicine

## 2012-04-20 ENCOUNTER — Encounter (HOSPITAL_COMMUNITY): Payer: Self-pay | Admitting: *Deleted

## 2012-04-20 DIAGNOSIS — I10 Essential (primary) hypertension: Secondary | ICD-10-CM

## 2012-04-20 DIAGNOSIS — K922 Gastrointestinal hemorrhage, unspecified: Secondary | ICD-10-CM

## 2012-04-20 DIAGNOSIS — D62 Acute posthemorrhagic anemia: Secondary | ICD-10-CM

## 2012-04-20 DIAGNOSIS — K5731 Diverticulosis of large intestine without perforation or abscess with bleeding: Principal | ICD-10-CM | POA: Diagnosis present

## 2012-04-20 HISTORY — PX: ESOPHAGOGASTRODUODENOSCOPY: SHX5428

## 2012-04-20 LAB — BASIC METABOLIC PANEL
BUN: 10 mg/dL (ref 6–23)
CO2: 26 mEq/L (ref 19–32)
Chloride: 104 mEq/L (ref 96–112)
Creatinine, Ser: 0.85 mg/dL (ref 0.50–1.10)
Glucose, Bld: 99 mg/dL (ref 70–99)

## 2012-04-20 LAB — CBC
Hemoglobin: 8.9 g/dL — ABNORMAL LOW (ref 12.0–15.0)
MCH: 29 pg (ref 26.0–34.0)
MCV: 85.7 fL (ref 78.0–100.0)
RBC: 3.07 MIL/uL — ABNORMAL LOW (ref 3.87–5.11)

## 2012-04-20 LAB — HEMOGLOBIN AND HEMATOCRIT, BLOOD: Hemoglobin: 8.3 g/dL — ABNORMAL LOW (ref 12.0–15.0)

## 2012-04-20 SURGERY — EGD (ESOPHAGOGASTRODUODENOSCOPY)
Anesthesia: Moderate Sedation

## 2012-04-20 MED ORDER — BUTAMBEN-TETRACAINE-BENZOCAINE 2-2-14 % EX AERO
INHALATION_SPRAY | CUTANEOUS | Status: DC | PRN
  Administered 2012-04-20: 1 via TOPICAL

## 2012-04-20 MED ORDER — MIDAZOLAM HCL 5 MG/ML IJ SOLN
INTRAMUSCULAR | Status: AC
  Filled 2012-04-20: qty 3

## 2012-04-20 MED ORDER — FENTANYL CITRATE 0.05 MG/ML IJ SOLN
INTRAMUSCULAR | Status: AC
Start: 2012-04-20 — End: 2012-04-20
  Filled 2012-04-20: qty 4

## 2012-04-20 MED ORDER — SODIUM CHLORIDE 0.9 % IV SOLN
INTRAVENOUS | Status: DC
  Administered 2012-04-20: 500 mL via INTRAVENOUS

## 2012-04-20 MED ORDER — MIDAZOLAM HCL 10 MG/2ML IJ SOLN
INTRAMUSCULAR | Status: DC | PRN
  Administered 2012-04-20 (×2): .5 mg via INTRAVENOUS
  Administered 2012-04-20: 1 mg via INTRAVENOUS

## 2012-04-20 NOTE — Op Note (Signed)
[facility] [address] [place], [address]   ENDOSCOPY PROCEDURE REPORT  PATIENT: Ashley West, Ashley West  MR#: [id number] BIRTHDATE: 07-26-1940 , 71  yrs. old GENDER: Female ENDOSCOPIST:Kelly Eisler, MD REFERRED BY:  Hospitalist (PCP = Dr. Lupe Carney) PROCEDURE DATE:  04/20/2012 PROCEDURE:      Upper endoscopy ASA CLASS: INDICATIONS:   hematochezia, felt most likely to be diverticular in origin, in a patient on daily low-dose aspirin MEDICATION:    Versed 2 mg IV (intentionally light sedation) TOPICAL ANESTHETIC:    Cetacaine spray  DESCRIPTION OF PROCEDURE:   the patient was brought from her hospital room to the [facility] cone endoscopy unit and, after time now, was sedated lightly with Versed 2 mg IV. The reason for light sedation was that the patient indicates that many years ago when she had an endoscopy by another physician, she "slept all day" and they had trouble waking her up. 2 facilitate patient comfort, we used the Pentax pediatric upper endoscope, which was passed under direct vision and entered the esophagus without significant difficulty. The vocal cords were not well seen.  The esophagus was normal except for a widely patent Schatzki's ring at the squamocolumnar junction, below which was a 2 cm hiatal hernia.  The stomach contained a small clear residual, and was free of any abnormalities such as ulcers, erosions, polyps, masses, or gastritis. A retroflexed view of the cardia was normal.  The pylorus, duodenal bulb, and second duodenum looked normal.  The scope was then removed from the patient. No biopsies were obtained. She tolerated the procedure quite well.     COMPLICATIONS: None  ENDOSCOPIC IMPRESSION:   1. No blood in the stomach or prospective bleeding site identified to account for patient's recent hematochezia, thereby supporting the clinical impression that this is a diverticular bleed  2. Small hiatal hernia  with Schatzki's ring  RECOMMENDATIONS:  1. treat as for diverticular hemorrhage, with periodic lab monitoring and supportive care.  2.would leave off aspirin for approximately 2 weeks  3. Since the patient has indicated that she simply does not want to have colonoscopic evaluation, I feel it is okay to provide her a regular diet at this time.    _______________________________ Rosalie DoctorBernette Redbird, MD 04/20/2012 11:18 AM    PATIENT NAME:  Ashley West, Ashley West MR#: [id number]

## 2012-04-20 NOTE — Progress Notes (Signed)
The patient's endoscopy went smoothly. We intentionally used very light sedation because of a prior history, many years ago, of prolonged sedation after an endoscopic procedure.  Today's exam was negative except for a minimal Schatzki's ring. Specifically, no prospective bleeding site was seen, thereby supporting our clinical impression that this is a diverticular bleed. The patient does admit to occasional dysphagia symptoms compatible with her Schatzki's ring, and I advised her and her husband that if these ever becomes sufficiently bothersome, she did have endoscopic dilatation.  I will start the patient on a solid diet, since she is declining Colonoscopic evaluation, and I will also stop her Protonix since there was no evidence of aspirin gastropathy.  Florencia Reasons, M.D. [phone]

## 2012-04-20 NOTE — Progress Notes (Signed)
No further bleeding.  Moderate drop in hgb overnight c/w equilibration.  Pt agreeable to egd this a.m., but still declines having a colonoscopy--she understands the risk of not doing (her brother in law has colon cancer) but she "just doesn't want it."  (Note:  She always refuses flu shots, also.)  Florencia Reasons, M.D. [phone]

## 2012-04-20 NOTE — Interval H&P Note (Signed)
History and Physical Interval Note:  04/20/2012 10:59 AM  Ashley West  has presented today for surgery, with the diagnosis of hematochezia  The various methods of treatment have been discussed with the patient and family. After consideration of risks, benefits and other options for treatment, the patient has consented to  Procedure(s): ESOPHAGOGASTRODUODENOSCOPY (EGD) (N/A) as a surgical intervention .  The patient's history has been reviewed, patient examined, no change in status, stable for surgery.  I have reviewed the patient's chart and labs.  Questions were answered to the patient's satisfaction.     Florencia Reasons

## 2012-04-20 NOTE — Progress Notes (Signed)
Brief Nutrition Note  Malnutrition Screening Tool result is inaccurate.  Pt not assessed by RD at this time.  Please consult if nutrition needs are identified.  Ibraheem Voris, MS RD LDN Clinical Inpatient Dietitian Pager: [phone] Weekend/After hours pager: [phone]  

## 2012-04-20 NOTE — Progress Notes (Signed)
[facility] Progress Note [place] TEAM 1 - Stepdown/ICU TEAM   KAVINA CANTAVE ZOX:[id number] DOB: 1940/04/29 DOA: 04/19/2012 PCP: Benita Stabile, MD  Brief narrative: 72 year old female patient with known diverticulosis on prior CT. She awakened on the morning of admission and upon going to the bathroom stall large amount of fresh red blood in the toilet. No nausea vomiting or abdominal pain reported. No dizziness chest pain shortness of breath et Karie Soda. She had 2 more episodes of rectal bleeding. She followed up with her primary care physician that morning. He arranged for outpatient gastroenterology evaluation on the same date at 4 PM. Before leaving the GI office she was instructed to report to the emergency department if she had additional bleeding. Unfortunately bleeding recurred and she presented to the emergency department. Bleeding at home was initially bright red in nature but had subsequently tapered off to dark with clots. Her hemoglobin was stable in the emergency department 10.6 from a baseline of 13.1 noting his baseline is able report from Dr. Donavan Burnet office. She was also tachycardic  Assessment/Plan: Active Problems:   GI bleed due Diverticulosis of colon with hemorrhage -GI following -EGD negative and pt declines colonoscopy -no bleeding since yesterday with only passage of melena and clots earlier this am -GI stopping Protonix since no evidence of ASA gastropathy -Advance diet to regular per GI    Acute blood loss anemia -baseline Hgb~13; 10 at presentation and today down to 9 -transfuse only if </= 7 -cycle CBC q 12hrs    HTN (hypertension) -BP soft so hold home meds -hold ASA for 2 weeks    Alcohol abuse -reports 3 beers daily so provide CIWA   DVT prophylaxis: SCDs Code Status: Full Family Communication: Patient Disposition Plan: Transfer to floor Isolation: None  Consultants: Gastroenterology  Procedures: EGD (4/10) ENDOSCOPIC  IMPRESSION:  1. No blood in the stomach or prospective bleeding site identified to account for patient's recent hematochezia, thereby supporting the clinical impression that this is a diverticular bleed  2. Small hiatal hernia with Schatzki's ring  RECOMMENDATIONS:  1. treat as for diverticular hemorrhage, with periodic lab  monitoring and supportive care.  2.would leave off aspirin for approximately 2 weeks  3. Since the patient has indicated that she simply does not want to have colonoscopic evaluation, I feel it is okay to provide her a regular diet at this time.  Antibiotics: None  HPI/Subjective: Patient denies abdominal pain, chest pain, shortness of breath or dizziness. Frustrated over monitoring devices and is eager to discharge home.   Objective: Blood pressure 122/62, pulse 65, temperature 97.7 F (36.5 C), temperature source Oral, resp. rate 21, height 5\' 1"  (1.549 m), weight 61.7 kg (136 lb 0.4 oz), SpO2 100.00%.  Intake/Output Summary (Last 24 hours) at 04/20/12 1319 Last data filed at 04/20/12 1300  Gross per 24 hour  Intake   1672 ml  Output      0 ml  Net   1672 ml     Exam: General: No acute respiratory distress Lungs: Clear to auscultation bilaterally without wheezes or crackles, RA Cardiovascular: Regular rate and rhythm without murmur gallop or rub normal S1 and S2, no peripheral edema or JVD Abdomen: Nontender, nondistended, soft, bowel sounds positive, no rebound, no ascites, no appreciable mass Musculoskeletal: No significant cyanosis, clubbing of bilateral lower extremities Neurological: Alert and oriented x 3, moves all extremities x 4 without focal neurological deficits, CN 2-13 intact  Data Reviewed: Basic Metabolic Panel:  Recent Labs Lab 04/19/12  1345 04/20/12 0316  NA 132* 134*  K 5.0 4.1  CL 99 104  CO2 28 26  GLUCOSE 139* 99  BUN 14 10  CREATININE 0.81 0.85  CALCIUM 10.3 9.3   Liver Function Tests:  Recent Labs Lab 04/19/12 1345   AST 18  ALT 14  ALKPHOS 63  BILITOT 0.4  PROT 6.4  ALBUMIN 3.5   No results found for this basename: LIPASE, AMYLASE,  in the last 168 hours No results found for this basename: AMMONIA,  in the last 168 hours CBC:  Recent Labs Lab 04/19/12 1345 04/19/12 1947 04/20/12 0316 04/20/12 1202  WBC 7.2  --   --   --   HGB 10.6* 9.6* 8.3* 9.0*  HCT 30.5* 27.7* 24.0* 27.6*  MCV 83.3  --   --   --   PLT 283  --   --   --    Cardiac Enzymes: No results found for this basename: CKTOTAL, CKMB, CKMBINDEX, TROPONINI,  in the last 168 hours BNP (last 3 results) No results found for this basename: PROBNP,  in the last 8760 hours CBG: No results found for this basename: GLUCAP,  in the last 168 hours  Recent Results (from the past 240 hour(s))  MRSA PCR SCREENING     Status: None   Collection Time    04/19/12  7:34 PM      Result Value Range Status   MRSA by PCR NEGATIVE  NEGATIVE Final   Comment:            The GeneXpert MRSA Assay (FDA     approved for NASAL specimens     only), is one component of a     comprehensive MRSA colonization     surveillance program. It is not     intended to diagnose MRSA     infection nor to guide or     monitor treatment for     MRSA infections.     Studies:  Recent x-ray studies have been reviewed in detail by the Attending Physician  Scheduled Meds:  Reviewed in detail by the Attending Physician   Junious Silk, ANP [facility] Office  [phone] Pager [phone]  On-Call/Text Page:      [email or web address]      password [email or web address]  If 7PM-7AM, please contact night-coverage [email or web address] Password [email or web address] 04/20/2012, 1:19 PM   LOS: 1 day   I have examined the patient, reviewed the chart and modified the above note which I agree with.   Damaya Channing,MD [phone] 04/20/2012, 4:25 PM

## 2012-04-21 DIAGNOSIS — F1010 Alcohol abuse, uncomplicated: Secondary | ICD-10-CM

## 2012-04-21 DIAGNOSIS — D649 Anemia, unspecified: Secondary | ICD-10-CM

## 2012-04-21 LAB — CBC
HCT: 22.3 % — ABNORMAL LOW (ref 36.0–46.0)
Hemoglobin: 7.6 g/dL — ABNORMAL LOW (ref 12.0–15.0)
MCHC: 34.1 g/dL (ref 30.0–36.0)
MCV: 85.8 fL (ref 78.0–100.0)
RDW: 15.3 % (ref 11.5–15.5)
WBC: 5.1 10*3/uL (ref 4.0–10.5)

## 2012-04-21 MED ORDER — LABETALOL HCL 5 MG/ML IV SOLN: 10.0000 mg | Freq: Four times a day (QID) | INTRAVENOUS | Status: DC | PRN

## 2012-04-21 MED ORDER — FUROSEMIDE 10 MG/ML IJ SOLN
40.0000 mg | Freq: Once | INTRAMUSCULAR | Status: AC
  Administered 2012-04-21: 40 mg via INTRAVENOUS
  Filled 2012-04-21: qty 4

## 2012-04-21 MED ORDER — FERROUS FUMARATE 325 (106 FE) MG PO TABS
1.0000 | ORAL_TABLET | Freq: Two times a day (BID) | ORAL | Status: DC
  Administered 2012-04-21 – 2012-04-23 (×5): 106 mg via ORAL
  Filled 2012-04-21 (×8): qty 1

## 2012-04-21 MED ORDER — PANTOPRAZOLE SODIUM 40 MG PO TBEC
40.0000 mg | DELAYED_RELEASE_TABLET | Freq: Every day | ORAL | Status: DC
  Administered 2012-04-21 – 2012-04-22 (×2): 40 mg via ORAL
  Filled 2012-04-21 (×2): qty 1

## 2012-04-21 NOTE — Progress Notes (Signed)
During blood administration patient began complaining of pain at her IV site. Blood stopped and IV team paged that patient needed new IV at 16:45.

## 2012-04-21 NOTE — Progress Notes (Signed)
Patient reported one time of small amount of blood in stool overnight, but states, "It is much better than when I first got here."  Nursing will continue to monitor.  Dr. Gwenlyn Perking aware, will see patient today.

## 2012-04-21 NOTE — Progress Notes (Addendum)
[facility] Progress Note    Ashley West ZOX:[id number] DOB: 12-Jun-1940 DOA: 04/19/2012 PCP: Benita Stabile, MD  Assessment/Plan:  GI bleed due Diverticulosis of colon with hemorrhage -GI following -EGD negative and pt declines colonoscopy -one more episode of bleeding overnight; Hgb down to 7.6 -per GI rec's no ASA for 2 weeks and no need for IV protonix. -continue current diet; patient reports no abdominal pain, nausea, vomiting and is at this point refusing colonoscopy.    Acute blood loss anemia -baseline Hgb~13; 10 at presentation and today down to 7.6 -still ongoing bleeding -will start ferrous sulfate -transfuse 1 unit and follow Hgb trend    HTN (hypertension) -BP soft and in settings of acute bleeding; will continue holding.    Alcohol abuse -reports 3 beers daily so will continue CIWA -no withdrawal -continue thiamine and folic acid   DVT prophylaxis: SCDs Code Status: Full Family Communication: no family at bedside Disposition Plan: home when medically stable  Consultants: Gastroenterology  Procedures: EGD (4/10) ENDOSCOPIC IMPRESSION:  1. No blood in the stomach or prospective bleeding site identified to account for patient's recent hematochezia, thereby supporting the clinical impression that this is a diverticular bleed  2. Small hiatal hernia with Schatzki's ring  RECOMMENDATIONS:  1. treat as for diverticular hemorrhage, with periodic lab  monitoring and supportive care.  2.would leave off aspirin for approximately 2 weeks  3. Since the patient has indicated that she simply does not want to have colonoscopic evaluation, I feel it is okay to provide her a regular diet at this time.  Antibiotics: None  HPI/Subjective: Patient denies abdominal pain, chest pain, shortness of breath or dizziness. Overnight has another episode of blood in stool; Hgb down to 7.6  Objective: Blood pressure 130/58, pulse 84, temperature 98.9 F (37.2 C),  temperature source Oral, resp. rate 16, height 5\' 1"  (1.549 m), weight 61.7 kg (136 lb 0.4 oz), SpO2 100.00%.  Intake/Output Summary (Last 24 hours) at 04/21/12 1225 Last data filed at 04/21/12 0801  Gross per 24 hour  Intake   1425 ml  Output      0 ml  Net   1425 ml     Exam: General: No acute respiratory distress Lungs: Clear to auscultation bilaterally without wheezes or crackles, good O2 sat on RA Cardiovascular: Regular rate and rhythm without murmur gallop or rub, normal S1 and S2, no peripheral edema or JVD. Abdomen: Nontender, nondistended, soft, bowel sounds positive, no rebound, no ascites, no appreciable mass Musculoskeletal: No significant cyanosis, clubbing of bilateral lower extremities; no edema Neurological: Alert and oriented x 3, moves all extremities x 4 without focal neurological deficits, CN 2-13 intact  Data Reviewed: Basic Metabolic Panel:  Recent Labs Lab 04/19/12 1345 04/20/12 0316  NA 132* 134*  K 5.0 4.1  CL 99 104  CO2 28 26  GLUCOSE 139* 99  BUN 14 10  CREATININE 0.81 0.85  CALCIUM 10.3 9.3   Liver Function Tests:  Recent Labs Lab 04/19/12 1345  AST 18  ALT 14  ALKPHOS 63  BILITOT 0.4  PROT 6.4  ALBUMIN 3.5   CBC:  Recent Labs Lab 04/19/12 1345 04/19/12 1947 04/20/12 0316 04/20/12 1202 04/20/12 1722 04/21/12 0700  WBC 7.2  --   --   --  6.4 5.1  HGB 10.6* 9.6* 8.3* 9.0* 8.9* 7.6*  HCT 30.5* 27.7* 24.0* 27.6* 26.3* 22.3*  MCV 83.3  --   --   --  85.7 85.8  PLT 283  --   --   --  278 254    Recent Results (from the past 240 hour(s))  MRSA PCR SCREENING     Status: None   Collection Time    04/19/12  7:34 PM      Result Value Range Status   MRSA by PCR NEGATIVE  NEGATIVE Final   Comment:            The GeneXpert MRSA Assay (FDA     approved for NASAL specimens     only), is one component of a     comprehensive MRSA colonization     surveillance program. It is not     intended to diagnose MRSA     infection nor to  guide or     monitor treatment for     MRSA infections.     Mya Suell [phone]  LOS: 2 days   04/21/2012, 12:25 PM

## 2012-04-21 NOTE — Progress Notes (Signed)
1 episode of bleeding last night, none since (as of when I saw the patient around noon today). Moderate drop in hemoglobin to 7.6.  Endoscopic findings reviewed with patient.  I think this is very compatible with a diverticular bleed, and would manage as such, with supportive care including transfusion.  Since the patient is refusing colonoscopy, there probably will not be of further role for [place] at this time, so I will plan to sign off. However, if you feel that further input from [place] in managing the patient's care would be helpful, don't hesitate to call us back.  Florencia Reasons, M.D. [phone]

## 2012-04-22 LAB — CBC
HCT: 28.7 % — ABNORMAL LOW (ref 36.0–46.0)
MCH: 28.4 pg (ref 26.0–34.0)
MCV: 84.9 fL (ref 78.0–100.0)
Platelets: 297 10*3/uL (ref 150–400)
RBC: 3.38 MIL/uL — ABNORMAL LOW (ref 3.87–5.11)

## 2012-04-22 LAB — TYPE AND SCREEN
ABO/RH(D): A POS
Antibody Screen: NEGATIVE
Unit division: 0

## 2012-04-22 LAB — BASIC METABOLIC PANEL
BUN: 17 mg/dL (ref 6–23)
CO2: 28 mEq/L (ref 19–32)
Calcium: 10.5 mg/dL (ref 8.4–10.5)
Creatinine, Ser: 1.02 mg/dL (ref 0.50–1.10)

## 2012-04-22 MED ORDER — POLYETHYLENE GLYCOL 3350 17 G PO PACK
17.0000 g | PACK | Freq: Every day | ORAL | Status: DC
  Administered 2012-04-22 – 2012-04-23 (×2): 17 g via ORAL
  Filled 2012-04-22 (×2): qty 1

## 2012-04-22 NOTE — Progress Notes (Signed)
[facility] Progress Note    Ashley West LKG:[id number] DOB: April 23, 1940 DOA: 04/19/2012 PCP: Benita Stabile, MD  Assessment/Plan:  GI bleed due Diverticulosis of colon with hemorrhage -GI has sign off for now. -EGD negative and pt declined colonoscopy (ok to have test done if bleeding continues) -no more episodes of bleeding since 04/19/12 -After 1 unit of blood given; Hgb 9.6 -per GI rec's no ASA for 2 weeks and no need for IV protonix. -continue current diet; patient reports no abdominal pain, nausea, vomiting or abdominal pain.    Acute blood loss anemia -baseline Hgb~13; 10 at presentation and down to 7.6 with last episode of bleeding on 04/19/12 -After 1 unit of PRBC given on 04/20/12, Hgb 9.6 -will continue ferrous sulfate    HTN (hypertension) -BP soft and in settings of acute bleeding; will continue holding antihypertensive agents.    Alcohol abuse -reports 3 beers daily, so will continue CIWA -no withdrawal sx's appreciated -continue thiamine and folic acid  DVT prophylaxis: SCDs Code Status: Full Family Communication: no family at bedside Disposition Plan: home when medically stable  Consultants: Gastroenterology  Procedures: EGD (4/10) ENDOSCOPIC IMPRESSION:  1. No blood in the stomach or prospective bleeding site identified to account for patient's recent hematochezia, thereby supporting the clinical impression that this is a diverticular bleed  2. Small hiatal hernia with Schatzki's ring  RECOMMENDATIONS:  1. treat as for diverticular hemorrhage, with periodic lab  monitoring and supportive care.  2.would leave off aspirin for approximately 2 weeks  3. Since the patient has indicated that she simply does not want to have colonoscopic evaluation, I feel it is okay to provide her a regular diet at this time.  Antibiotics: None  HPI/Subjective: Patient denies abdominal pain, chest pain, shortness of breath or dizziness. Overnight has another  episode of blood in stool; Hgb down to 7.6  Objective: Blood pressure 127/58, pulse 72, temperature 98.9 F (37.2 C), temperature source Oral, resp. rate 20, height 5\' 1"  (1.549 m), weight 61.7 kg (136 lb 0.4 oz), SpO2 100.00%.  Intake/Output Summary (Last 24 hours) at 04/22/12 1400 Last data filed at 04/21/12 1907  Gross per 24 hour  Intake    350 ml  Output      0 ml  Net    350 ml     Exam: General: No acute respiratory distress Lungs: Clear to auscultation bilaterally without wheezes or crackles, good O2 sat on RA Cardiovascular: Regular rate and rhythm without murmur gallop or rub, normal S1 and S2, no peripheral edema or JVD. Abdomen: Nontender, nondistended, soft, bowel sounds positive, no rebound, no ascites, no appreciable mass Musculoskeletal: No significant cyanosis, clubbing of bilateral lower extremities; no edema Neurological: Alert and oriented x 3, moves all extremities x 4 without focal neurological deficits, CN 2-13 intact  Data Reviewed: Basic Metabolic Panel:  Recent Labs Lab 04/19/12 1345 04/20/12 0316 04/22/12 0720  NA 132* 134* 137  K 5.0 4.1 3.8  CL 99 104 102  CO2 28 26 28   GLUCOSE 139* 99 92  BUN 14 10 17   CREATININE 0.81 0.85 1.02  CALCIUM 10.3 9.3 10.5   Liver Function Tests:  Recent Labs Lab 04/19/12 1345  AST 18  ALT 14  ALKPHOS 63  BILITOT 0.4  PROT 6.4  ALBUMIN 3.5   CBC:  Recent Labs Lab 04/19/12 1345  04/20/12 0316 04/20/12 1202 04/20/12 1722 04/21/12 0700 04/22/12 0720  WBC 7.2  --   --   --  6.4 5.1  7.0  HGB 10.6*  < > 8.3* 9.0* 8.9* 7.6* 9.6*  HCT 30.5*  < > 24.0* 27.6* 26.3* 22.3* 28.7*  MCV 83.3  --   --   --  85.7 85.8 84.9  PLT 283  --   --   --  278 254 297  < > = values in this interval not displayed.  Recent Results (from the past 240 hour(s))  MRSA PCR SCREENING     Status: None   Collection Time    04/19/12  7:34 PM      Result Value Range Status   MRSA by PCR NEGATIVE  NEGATIVE Final   Comment:             The GeneXpert MRSA Assay (FDA     approved for NASAL specimens     only), is one component of a     comprehensive MRSA colonization     surveillance program. It is not     intended to diagnose MRSA     infection nor to guide or     monitor treatment for     MRSA infections.     Jordi Kamm [phone]  LOS: 3 days   04/22/2012, 2:00 PM

## 2012-04-23 DIAGNOSIS — K5731 Diverticulosis of large intestine without perforation or abscess with bleeding: Principal | ICD-10-CM

## 2012-04-23 LAB — CBC
HCT: 26.8 % — ABNORMAL LOW (ref 36.0–46.0)
MCH: 28.5 pg (ref 26.0–34.0)
MCHC: 33.6 g/dL (ref 30.0–36.0)
MCV: 84.8 fL (ref 78.0–100.0)
Platelets: 295 10*3/uL (ref 150–400)
RDW: 15.2 % (ref 11.5–15.5)
WBC: 5.8 10*3/uL (ref 4.0–10.5)

## 2012-04-23 MED ORDER — ASPIRIN 81 MG PO CHEW: 81.0000 mg | CHEWABLE_TABLET | Freq: Every day | ORAL | Status: DC

## 2012-04-23 MED ORDER — FERROUS FUMARATE 325 (106 FE) MG PO TABS: 1.0000 | ORAL_TABLET | Freq: Two times a day (BID) | ORAL | Status: DC

## 2012-04-23 MED ORDER — POLYETHYLENE GLYCOL 3350 17 G PO PACK: 17.0000 g | PACK | Freq: Two times a day (BID) | ORAL | Status: DC

## 2012-04-23 NOTE — Discharge Summary (Signed)
Physician Discharge Summary  Ashley West NFA:[id number] DOB: [birth year] DOA: 04/19/2012  PCP: Benita Stabile, MD  Admit date: 04/19/2012 Discharge date: 04/23/2012  Time spent: > 30 minutes  Recommendations for Outpatient Follow-up:  1. CBC to follow Hgb 2. Reassess BP and adjust medications as needed 3. Might require outpatient colonoscopy  Discharge Diagnoses:  Active Problems:   GI bleed   HTN (hypertension)   Acute blood loss anemia   Alcohol abuse   Diverticulosis of colon with hemorrhage   Discharge Condition: stable and improved. No further episodes of bloody stools. Patient discharged home with instructions to follow with PCP in 1 week.  Diet recommendation: heart healthy with increased fiber intake and good hydration.  Filed Weights   04/19/12 1848 04/19/12 2344  Weight: 61.2 kg (134 lb 14.7 oz) 61.7 kg (136 lb 0.4 oz)    History of present illness:  72 year old female with history of hypertension, hiatal hernia presented to ED for rectal bleeding that started this morning. History was obtained from the patient who stated that she woke up at 5 AM in the morning to use the bathroom and saw large amount of fresh blood in the toilet. She denied any abdominal pain, nausea, vomiting or any hematemesis. She denied any dizziness, lightheadedness or any syncopal episode. The patient subsequently had 2 more episodes of rectal bleeding. She went to see her PCP, Dr. Clovis Riley at 8:30 AM in the office. Dr. Clovis Riley arranged the GI appointment at 4 PM with Dr. Evette Cristal today but advised her to go to the ER if she has any more of the bleeding. Patient went home and had another episode of bleeding and subsequently presented to the ED and had another episode while waiting in ED. In total she had 5 episodes of rectal bleeding today. She described it as BRBPR in the morning however during the day became darker with clots. She takes baby aspirin every day however denies any NSAID  use.   Hospital Course:  GI bleed due Diverticulosis of colon with hemorrhage  -GI has recommended 2 weeks off ASA; outpatient follow up -also increase fiber, good hydration and stool softener. -EGD negative and pt declined colonoscopy (ok to have test done if bleeding continues)  -no more episodes of bleeding since 04/19/12  -After 1 unit of blood given; Hgb up to 9.6 and at discharge 9.0   Acute blood loss anemia  -baseline Hgb~13; 10 at presentation and down to 7.6 with last episode of bleeding on 04/19/12  -After 1 unit of PRBC given on 04/20/12  -patient kept on observation, diet advance and no further bloody BM's appreciated. -At discharge Hgb 9.0 -started on ferrous sulfate   HTN (hypertension)  -BP raising at discharge and no further episodes of bleeding. -antihypertensive agents resumed and patient advise to follow low sodium diet.  Hx of Alcohol abuse  -reports 3 beers or so daily -maintained under CIWA protocol while in the hospital; no withdrawal sx's appreciated  -continue thiamine and folic acid with multivitamin  GERD -continue PPI  *Rest of medical problems remains stable and the plan is to continue current medication regimen and to follow with PCP at discharge.  Procedures: EGD (4/10)  ENDOSCOPIC IMPRESSION:  1. No blood in the stomach or prospective bleeding site identified to account for patient's recent hematochezia, thereby supporting the clinical impression that this is a diverticular bleed  2. Small hiatal hernia with Schatzki's ring   Consultations:  [facility]  Discharge Exam: Filed Vitals:  04/22/12 0634 04/22/12 1432 04/22/12 2134 04/23/12 0613  BP: 127/58 154/62 155/70 130/52  Pulse: 72 95 79 73  Temp: 98.9 F (37.2 C) 98 F (36.7 C) 98.6 F (37 C) 98.5 F (36.9 C)  TempSrc:  Oral Oral Oral  Resp: 20 20 18 18   Height:      Weight:      SpO2: 100% 100% 100% 100%   General: No acute respiratory distress  Lungs: Clear to auscultation  bilaterally without wheezes or crackles, good O2 sat on RA  Cardiovascular: Regular rate and rhythm without murmur gallop or rub, normal S1 and S2, no peripheral edema or JVD.  Abdomen: Nontender, nondistended, soft, bowel sounds positive, no rebound, no ascites, no appreciable mass  Musculoskeletal: No significant cyanosis, clubbing of bilateral lower extremities; no edema  Neurological: Alert and oriented x 3, moves all extremities x 4 without focal neurological deficits, CN 2-13 intact   Discharge Instructions  Discharge Orders   Future Orders Complete By Expires     Diet - low sodium heart healthy  As directed     Discharge instructions  As directed     Comments:      INCREASED FIBER IN YOUR DIET KEEP YOURSELF WELL HYDRATED TAKE MEDICATIONS AS PRESCRIBED FOLLOW WITH PCP IN 1 WEEK        Medication List    TAKE these medications       aspirin 81 MG chewable tablet  Chew 1 tablet (81 mg total) by mouth daily. STOP ASPIRIN FOR 2 WEEKS     b complex vitamins tablet  Take 1 tablet by mouth daily.     ferrous fumarate 325 (106 FE) MG Tabs  Commonly known as:  HEMOCYTE - 106 mg FE  Take 1 tablet (106 mg of iron total) by mouth 2 (two) times daily.     losartan-hydrochlorothiazide 50-12.5 MG per tablet  Commonly known as:  HYZAAR  Take 1 tablet by mouth daily.     multivitamin with minerals Tabs  Take 1 tablet by mouth daily.     polyethylene glycol packet  Commonly known as:  MIRALAX / GLYCOLAX  Take 17 g by mouth 2 (two) times daily.     potassium gluconate 595 MG Tabs  Take 1,190 mg by mouth. Takes 2 tablets @ 99mg  equivalent each.     vitamin C 250 MG tablet  Commonly known as:  ASCORBIC ACID  Take 250 mg by mouth daily.     vitamin E 400 UNIT capsule  Generic drug:  vitamin E  Take 400 Units by mouth daily.           Follow-up Information   Follow up with Benita Stabile, MD. Schedule an appointment as soon as possible for a visit in 1 week.    Contact information:   [facility] [address] [place] [address] [phone]       The results of significant diagnostics from this hospitalization (including imaging, microbiology, ancillary and laboratory) are listed below for reference.    Significant Diagnostic Studies: No results found.  Microbiology: Recent Results (from the past 240 hour(s))  MRSA PCR SCREENING     Status: None   Collection Time    04/19/12  7:34 PM      Result Value Range Status   MRSA by PCR NEGATIVE  NEGATIVE Final   Comment:            The GeneXpert MRSA Assay (FDA  approved for NASAL specimens     only), is one component of a     comprehensive MRSA colonization     surveillance program. It is not     intended to diagnose MRSA     infection nor to guide or     monitor treatment for     MRSA infections.     Labs: Basic Metabolic Panel:  Recent Labs Lab 04/19/12 1345 04/20/12 0316 04/22/12 0720  NA 132* 134* 137  K 5.0 4.1 3.8  CL 99 104 102  CO2 28 26 28   GLUCOSE 139* 99 92  BUN 14 10 17   CREATININE 0.81 0.85 1.02  CALCIUM 10.3 9.3 10.5   Liver Function Tests:  Recent Labs Lab 04/19/12 1345  AST 18  ALT 14  ALKPHOS 63  BILITOT 0.4  PROT 6.4  ALBUMIN 3.5   CBC:  Recent Labs Lab 04/19/12 1345  04/20/12 1202 04/20/12 1722 04/21/12 0700 04/22/12 0720 04/23/12 0543  WBC 7.2  --   --  6.4 5.1 7.0 5.8  HGB 10.6*  < > 9.0* 8.9* 7.6* 9.6* 9.0*  HCT 30.5*  < > 27.6* 26.3* 22.3* 28.7* 26.8*  MCV 83.3  --   --  85.7 85.8 84.9 84.8  PLT 283  --   --  278 254 297 295  < > = values in this interval not displayed.   Signed:  Shanece Cochrane  [facility] 04/23/2012, 11:37 AM

## 2012-04-24 ENCOUNTER — Encounter (HOSPITAL_COMMUNITY): Payer: Self-pay | Admitting: Gastroenterology

## 2013-02-08 ENCOUNTER — Ambulatory Visit
Admission: RE | Admit: 2013-02-08 | Discharge: 2013-02-08 | Disposition: A | Discharge status: Home / Self Care | Payer: Medicare HMO | Source: Ambulatory Visit | Attending: Family Medicine | Admitting: Family Medicine

## 2013-02-08 ENCOUNTER — Other Ambulatory Visit: Payer: Self-pay | Admitting: Family Medicine

## 2013-02-08 DIAGNOSIS — M546 Pain in thoracic spine: Secondary | ICD-10-CM

## 2013-02-22 ENCOUNTER — Encounter (HOSPITAL_COMMUNITY): Payer: Medicare HMO | Admitting: Anesthesiology

## 2013-02-22 ENCOUNTER — Emergency Department (HOSPITAL_COMMUNITY): Payer: Medicare HMO

## 2013-02-22 ENCOUNTER — Encounter (HOSPITAL_COMMUNITY): Payer: Self-pay | Admitting: Emergency Medicine

## 2013-02-22 ENCOUNTER — Emergency Department (HOSPITAL_COMMUNITY): Payer: Medicare HMO | Admitting: Anesthesiology

## 2013-02-22 ENCOUNTER — Encounter (HOSPITAL_COMMUNITY)
Admission: EM | Disposition: A | Discharge status: Skilled Nursing Facility | Payer: Self-pay | Source: Home / Self Care | Attending: Neurosurgery

## 2013-02-22 ENCOUNTER — Inpatient Hospital Stay (HOSPITAL_COMMUNITY)
Admission: EM | Admit: 2013-02-22 | Discharge: 2013-03-02 | DRG: 821 | Disposition: A | Discharge status: Skilled Nursing Facility | Payer: Medicare HMO | Attending: Neurosurgery | Admitting: Neurosurgery

## 2013-02-22 ENCOUNTER — Inpatient Hospital Stay (HOSPITAL_COMMUNITY): Payer: Medicare HMO

## 2013-02-22 DIAGNOSIS — C7949 Secondary malignant neoplasm of other parts of nervous system: Secondary | ICD-10-CM

## 2013-02-22 DIAGNOSIS — C8589 Other specified types of non-Hodgkin lymphoma, extranodal and solid organ sites: Principal | ICD-10-CM | POA: Diagnosis present

## 2013-02-22 DIAGNOSIS — D649 Anemia, unspecified: Secondary | ICD-10-CM

## 2013-02-22 DIAGNOSIS — K5900 Constipation, unspecified: Secondary | ICD-10-CM | POA: Diagnosis not present

## 2013-02-22 DIAGNOSIS — Z23 Encounter for immunization: Secondary | ICD-10-CM

## 2013-02-22 DIAGNOSIS — C7931 Secondary malignant neoplasm of brain: Secondary | ICD-10-CM

## 2013-02-22 DIAGNOSIS — R32 Unspecified urinary incontinence: Secondary | ICD-10-CM | POA: Diagnosis present

## 2013-02-22 DIAGNOSIS — K5731 Diverticulosis of large intestine without perforation or abscess with bleeding: Secondary | ICD-10-CM

## 2013-02-22 DIAGNOSIS — G992 Myelopathy in diseases classified elsewhere: Secondary | ICD-10-CM | POA: Diagnosis present

## 2013-02-22 DIAGNOSIS — J9600 Acute respiratory failure, unspecified whether with hypoxia or hypercapnia: Secondary | ICD-10-CM

## 2013-02-22 DIAGNOSIS — Z79899 Other long term (current) drug therapy: Secondary | ICD-10-CM

## 2013-02-22 DIAGNOSIS — F1010 Alcohol abuse, uncomplicated: Secondary | ICD-10-CM

## 2013-02-22 DIAGNOSIS — C799 Secondary malignant neoplasm of unspecified site: Secondary | ICD-10-CM

## 2013-02-22 DIAGNOSIS — G8220 Paraplegia, unspecified: Secondary | ICD-10-CM

## 2013-02-22 DIAGNOSIS — Z7982 Long term (current) use of aspirin: Secondary | ICD-10-CM

## 2013-02-22 DIAGNOSIS — K219 Gastro-esophageal reflux disease without esophagitis: Secondary | ICD-10-CM | POA: Diagnosis not present

## 2013-02-22 DIAGNOSIS — C801 Malignant (primary) neoplasm, unspecified: Secondary | ICD-10-CM

## 2013-02-22 DIAGNOSIS — I10 Essential (primary) hypertension: Secondary | ICD-10-CM

## 2013-02-22 DIAGNOSIS — IMO0002 Reserved for concepts with insufficient information to code with codable children: Secondary | ICD-10-CM

## 2013-02-22 HISTORY — PX: LAMINECTOMY: SHX219

## 2013-02-22 LAB — POCT I-STAT 7, (LYTES, BLD GAS, ICA,H+H)
Bicarbonate: 24.5 mEq/L — ABNORMAL HIGH (ref 20.0–24.0)
CALCIUM ION: 1.4 mmol/L — AB (ref 1.13–1.30)
HCT: 30 % — ABNORMAL LOW (ref 36.0–46.0)
Hemoglobin: 10.2 g/dL — ABNORMAL LOW (ref 12.0–15.0)
O2 SAT: 100 %
PCO2 ART: 35.4 mmHg (ref 35.0–45.0)
Patient temperature: 34.7
Potassium: 3.5 mEq/L — ABNORMAL LOW (ref 3.7–5.3)
Sodium: 133 mEq/L — ABNORMAL LOW (ref 137–147)
TCO2: 26 mmol/L (ref 0–100)
pH, Arterial: 7.438 (ref 7.350–7.450)
pO2, Arterial: 277 mmHg — ABNORMAL HIGH (ref 80.0–100.0)

## 2013-02-22 LAB — BASIC METABOLIC PANEL
BUN: 8 mg/dL (ref 6–23)
CALCIUM: 11.6 mg/dL — AB (ref 8.4–10.5)
CO2: 27 mEq/L (ref 19–32)
Chloride: 92 mEq/L — ABNORMAL LOW (ref 96–112)
Creatinine, Ser: 0.71 mg/dL (ref 0.50–1.10)
GFR, EST NON AFRICAN AMERICAN: 85 mL/min — AB (ref >90)
Glucose, Bld: 116 mg/dL — ABNORMAL HIGH (ref 70–99)
POTASSIUM: 4.3 meq/L (ref 3.7–5.3)
SODIUM: 133 meq/L — AB (ref 137–147)

## 2013-02-22 LAB — CBC WITH DIFFERENTIAL/PLATELET
BASOS PCT: 0 % (ref 0–1)
Basophils Absolute: 0 10*3/uL (ref 0.0–0.1)
EOS ABS: 0 10*3/uL (ref 0.0–0.7)
Eosinophils Relative: 0 % (ref 0–5)
HCT: 36.1 % (ref 36.0–46.0)
Hemoglobin: 12.2 g/dL (ref 12.0–15.0)
Lymphocytes Relative: 9 % — ABNORMAL LOW (ref 12–46)
Lymphs Abs: 0.7 10*3/uL (ref 0.7–4.0)
MCH: 27.6 pg (ref 26.0–34.0)
MCHC: 33.8 g/dL (ref 30.0–36.0)
MCV: 81.7 fL (ref 78.0–100.0)
Monocytes Absolute: 0.4 10*3/uL (ref 0.1–1.0)
Monocytes Relative: 6 % (ref 3–12)
NEUTROS PCT: 84 % — AB (ref 43–77)
Neutro Abs: 5.8 10*3/uL (ref 1.7–7.7)
PLATELETS: 397 10*3/uL (ref 150–400)
RBC: 4.42 MIL/uL (ref 3.87–5.11)
RDW: 14.5 % (ref 11.5–15.5)
WBC: 6.9 10*3/uL (ref 4.0–10.5)

## 2013-02-22 LAB — PROTIME-INR
INR: 1 (ref 0.00–1.49)
INR: 1.02 (ref 0.00–1.49)
Prothrombin Time: 13 seconds (ref 11.6–15.2)
Prothrombin Time: 13.2 seconds (ref 11.6–15.2)

## 2013-02-22 LAB — APTT: aPTT: 36 seconds (ref 24–37)

## 2013-02-22 LAB — PREPARE RBC (CROSSMATCH)

## 2013-02-22 SURGERY — THORACIC LAMINECTOMY FOR TUMOR
Anesthesia: General | Site: Back

## 2013-02-22 MED ORDER — VITAMIN C 500 MG PO TABS: 500.0000 mg | ORAL_TABLET | Freq: Every day | ORAL | Status: DC

## 2013-02-22 MED ORDER — FENTANYL CITRATE 0.05 MG/ML IJ SOLN
INTRAMUSCULAR | Status: AC
  Filled 2013-02-22: qty 5

## 2013-02-22 MED ORDER — PROPOFOL 10 MG/ML IV BOLUS
INTRAVENOUS | Status: DC | PRN
  Administered 2013-02-22: 100 mg via INTRAVENOUS

## 2013-02-22 MED ORDER — PROPOFOL 10 MG/ML IV EMUL
INTRAVENOUS | Status: AC
  Filled 2013-02-22: qty 100

## 2013-02-22 MED ORDER — DEXMEDETOMIDINE HCL IN NACL 200 MCG/50ML IV SOLN
INTRAVENOUS | Status: DC | PRN
  Administered 2013-02-22: .6 ug/kg/h via INTRAVENOUS

## 2013-02-22 MED ORDER — THROMBIN 20000 UNITS EX SOLR
CUTANEOUS | Status: DC | PRN
  Administered 2013-02-22: 20:00:00 via TOPICAL

## 2013-02-22 MED ORDER — KCL IN DEXTROSE-NACL 20-5-0.45 MEQ/L-%-% IV SOLN
INTRAVENOUS | Status: DC
  Administered 2013-02-22 – 2013-02-23 (×3): via INTRAVENOUS
  Filled 2013-02-22 (×4): qty 1000

## 2013-02-22 MED ORDER — INFLUENZA VAC SPLIT QUAD 0.5 ML IM SUSP
0.5000 mL | INTRAMUSCULAR | Status: AC
  Administered 2013-02-23: 0.5 mL via INTRAMUSCULAR
  Filled 2013-02-22: qty 0.5

## 2013-02-22 MED ORDER — GADOBENATE DIMEGLUMINE 529 MG/ML IV SOLN
10.0000 mL | Freq: Once | INTRAVENOUS | Status: AC
  Administered 2013-02-22: 10 mL via INTRAVENOUS

## 2013-02-22 MED ORDER — MENTHOL 3 MG MT LOZG
1.0000 | LOZENGE | OROMUCOSAL | Status: DC | PRN
Start: 2013-02-22 — End: 2013-02-22

## 2013-02-22 MED ORDER — LIDOCAINE-EPINEPHRINE 1 %-1:100000 IJ SOLN
INTRAMUSCULAR | Status: DC | PRN
  Administered 2013-02-22: 10 mL

## 2013-02-22 MED ORDER — SODIUM CHLORIDE 0.9 % IV SOLN: 250.0000 mL | INTRAVENOUS | Status: DC

## 2013-02-22 MED ORDER — ALBUMIN HUMAN 5 % IV SOLN
INTRAVENOUS | Status: DC | PRN
  Administered 2013-02-22: 20:00:00 via INTRAVENOUS

## 2013-02-22 MED ORDER — LOSARTAN POTASSIUM 50 MG PO TABS: 50.0000 mg | ORAL_TABLET | Freq: Every day | ORAL | Status: DC

## 2013-02-22 MED ORDER — FAMOTIDINE 20 MG PO TABS
20.0000 mg | ORAL_TABLET | Freq: Two times a day (BID) | ORAL | Status: DC
  Filled 2013-02-22: qty 1

## 2013-02-22 MED ORDER — DOCUSATE SODIUM 100 MG PO CAPS
100.0000 mg | ORAL_CAPSULE | Freq: Two times a day (BID) | ORAL | Status: DC
  Filled 2013-02-22: qty 1

## 2013-02-22 MED ORDER — CEFAZOLIN SODIUM 1-5 GM-% IV SOLN
1.0000 g | Freq: Three times a day (TID) | INTRAVENOUS | Status: AC
  Administered 2013-02-23 (×2): 1 g via INTRAVENOUS
  Filled 2013-02-22 (×2): qty 50

## 2013-02-22 MED ORDER — LIDOCAINE HCL (CARDIAC) 20 MG/ML IV SOLN
INTRAVENOUS | Status: DC | PRN
  Administered 2013-02-22: 100 mg via INTRAVENOUS

## 2013-02-22 MED ORDER — PANTOPRAZOLE SODIUM 40 MG IV SOLR
40.0000 mg | Freq: Every day | INTRAVENOUS | Status: DC
  Administered 2013-02-22 – 2013-02-23 (×2): 40 mg via INTRAVENOUS
  Filled 2013-02-22 (×3): qty 40

## 2013-02-22 MED ORDER — SODIUM CHLORIDE 0.9 % IJ SOLN
3.0000 mL | Freq: Two times a day (BID) | INTRAMUSCULAR | Status: DC
  Administered 2013-02-22 – 2013-03-02 (×14): 3 mL via INTRAVENOUS

## 2013-02-22 MED ORDER — LACTATED RINGERS IV SOLN
INTRAVENOUS | Status: DC | PRN
  Administered 2013-02-22: 19:00:00 via INTRAVENOUS

## 2013-02-22 MED ORDER — BIOTENE DRY MOUTH MT LIQD
1.0000 "application " | Freq: Four times a day (QID) | OROMUCOSAL | Status: DC
  Administered 2013-02-23 – 2013-02-24 (×6): 15 mL via OROMUCOSAL

## 2013-02-22 MED ORDER — ONDANSETRON HCL 4 MG/2ML IJ SOLN
INTRAMUSCULAR | Status: DC | PRN
  Administered 2013-02-22: 4 mg via INTRAVENOUS

## 2013-02-22 MED ORDER — MORPHINE SULFATE 2 MG/ML IJ SOLN: 1.0000 mg | INTRAMUSCULAR | Status: DC | PRN

## 2013-02-22 MED ORDER — ACETAMINOPHEN 325 MG PO TABS: 650.0000 mg | ORAL_TABLET | ORAL | Status: DC | PRN

## 2013-02-22 MED ORDER — PHENYLEPHRINE HCL 10 MG/ML IJ SOLN
10.0000 mg | INTRAVENOUS | Status: DC | PRN
  Administered 2013-02-22: 50 ug/min via INTRAVENOUS

## 2013-02-22 MED ORDER — ALUM & MAG HYDROXIDE-SIMETH 200-200-20 MG/5ML PO SUSP: 30.0000 mL | Freq: Four times a day (QID) | ORAL | Status: DC | PRN

## 2013-02-22 MED ORDER — DEXAMETHASONE SODIUM PHOSPHATE 4 MG/ML IJ SOLN: 4.0000 mg | Freq: Four times a day (QID) | INTRAMUSCULAR | Status: DC

## 2013-02-22 MED ORDER — BUPIVACAINE HCL (PF) 0.5 % IJ SOLN
INTRAMUSCULAR | Status: DC | PRN
  Administered 2013-02-22: 10 mL

## 2013-02-22 MED ORDER — HYDROCODONE-ACETAMINOPHEN 5-325 MG PO TABS: 1.0000 | ORAL_TABLET | ORAL | Status: DC | PRN

## 2013-02-22 MED ORDER — DIAZEPAM 5 MG PO TABS: 5.0000 mg | ORAL_TABLET | Freq: Four times a day (QID) | ORAL | Status: DC | PRN

## 2013-02-22 MED ORDER — VITAMIN E 180 MG (400 UNIT) PO CAPS: 400.0000 [IU] | ORAL_CAPSULE | Freq: Every day | ORAL | Status: DC

## 2013-02-22 MED ORDER — HYDROCHLOROTHIAZIDE 12.5 MG PO CAPS: 12.5000 mg | ORAL_CAPSULE | Freq: Every day | ORAL | Status: DC

## 2013-02-22 MED ORDER — VITAMIN C 500 MG PO CHEW: 500.0000 mg | CHEWABLE_TABLET | Freq: Every day | ORAL | Status: DC

## 2013-02-22 MED ORDER — POLYETHYLENE GLYCOL 3350 17 G PO PACK
17.0000 g | PACK | Freq: Every day | ORAL | Status: DC
  Administered 2013-02-24 – 2013-03-02 (×7): 17 g via ORAL
  Filled 2013-02-22 (×8): qty 1

## 2013-02-22 MED ORDER — DEXAMETHASONE 4 MG PO TABS: 4.0000 mg | ORAL_TABLET | Freq: Four times a day (QID) | ORAL | Status: DC

## 2013-02-22 MED ORDER — PROPOFOL 10 MG/ML IV BOLUS
INTRAVENOUS | Status: AC
  Filled 2013-02-22: qty 20

## 2013-02-22 MED ORDER — POTASSIUM CHLORIDE CRYS ER 10 MEQ PO TBCR: 10.0000 meq | EXTENDED_RELEASE_TABLET | ORAL | Status: DC

## 2013-02-22 MED ORDER — B COMPLEX-C PO TABS: 1.0000 | ORAL_TABLET | Freq: Every day | ORAL | Status: DC

## 2013-02-22 MED ORDER — OXYCODONE-ACETAMINOPHEN 5-325 MG PO TABS
1.0000 | ORAL_TABLET | ORAL | Status: DC | PRN
  Administered 2013-02-23 – 2013-03-02 (×15): 1 via ORAL
  Filled 2013-02-22 (×6): qty 1
  Filled 2013-02-22: qty 2
  Filled 2013-02-22 (×8): qty 1

## 2013-02-22 MED ORDER — PROPOFOL 10 MG/ML IV EMUL
5.0000 ug/kg/min | INTRAVENOUS | Status: DC
  Administered 2013-02-22: 10 ug/kg/min via INTRAVENOUS
  Administered 2013-02-23: 30 ug/kg/min via INTRAVENOUS
  Filled 2013-02-22: qty 100

## 2013-02-22 MED ORDER — 0.9 % SODIUM CHLORIDE (POUR BTL) OPTIME
TOPICAL | Status: DC | PRN
  Administered 2013-02-22: 1000 mL

## 2013-02-22 MED ORDER — POTASSIUM GLUCONATE 595 (99 K) MG PO TABS: 1190.0000 mg | ORAL_TABLET | Freq: Every day | ORAL | Status: DC

## 2013-02-22 MED ORDER — FENTANYL CITRATE 0.05 MG/ML IJ SOLN
INTRAMUSCULAR | Status: DC | PRN
  Administered 2013-02-22: 175 ug via INTRAVENOUS
  Administered 2013-02-22: 100 ug via INTRAVENOUS
  Administered 2013-02-22: 50 ug via INTRAVENOUS
  Administered 2013-02-22: 100 ug via INTRAVENOUS
  Administered 2013-02-22: 25 ug via INTRAVENOUS
  Administered 2013-02-22: 50 ug via INTRAVENOUS

## 2013-02-22 MED ORDER — POLYETHYLENE GLYCOL 3350 17 G PO PACK
17.0000 g | PACK | Freq: Every day | ORAL | Status: DC | PRN
Start: 2013-02-22 — End: 2013-03-02

## 2013-02-22 MED ORDER — LIDOCAINE HCL (CARDIAC) 20 MG/ML IV SOLN
INTRAVENOUS | Status: AC
  Filled 2013-02-22: qty 5

## 2013-02-22 MED ORDER — LOSARTAN POTASSIUM-HCTZ 50-12.5 MG PO TABS: 1.0000 | ORAL_TABLET | Freq: Every day | ORAL | Status: DC

## 2013-02-22 MED ORDER — CHLORHEXIDINE GLUCONATE 0.12 % MT SOLN
15.0000 mL | Freq: Two times a day (BID) | OROMUCOSAL | Status: DC
  Administered 2013-02-22 – 2013-02-23 (×2): 15 mL via OROMUCOSAL
  Filled 2013-02-22 (×2): qty 15

## 2013-02-22 MED ORDER — PNEUMOCOCCAL VAC POLYVALENT 25 MCG/0.5ML IJ INJ
0.5000 mL | INJECTION | INTRAMUSCULAR | Status: AC
  Administered 2013-02-23: 0.5 mL via INTRAMUSCULAR
  Filled 2013-02-22: qty 0.5

## 2013-02-22 MED ORDER — B COMPLEX PO TABS: 1.0000 | ORAL_TABLET | Freq: Every day | ORAL | Status: DC

## 2013-02-22 MED ORDER — PANTOPRAZOLE SODIUM 40 MG PO TBEC: 40.0000 mg | DELAYED_RELEASE_TABLET | Freq: Every day | ORAL | Status: DC

## 2013-02-22 MED ORDER — ACETAMINOPHEN 650 MG RE SUPP: 650.0000 mg | RECTAL | Status: DC | PRN

## 2013-02-22 MED ORDER — ONDANSETRON HCL 4 MG/2ML IJ SOLN: 4.0000 mg | Freq: Once | INTRAMUSCULAR | Status: DC | PRN

## 2013-02-22 MED ORDER — DEXAMETHASONE SODIUM PHOSPHATE 10 MG/ML IJ SOLN
20.0000 mg | Freq: Once | INTRAMUSCULAR | Status: AC
  Administered 2013-02-22: 20 mg via INTRAVENOUS
  Filled 2013-02-22: qty 2

## 2013-02-22 MED ORDER — FLEET ENEMA 7-19 GM/118ML RE ENEM: 1.0000 | ENEMA | Freq: Once | RECTAL | Status: AC | PRN

## 2013-02-22 MED ORDER — SODIUM CHLORIDE 0.9 % IJ SOLN: 3.0000 mL | INTRAMUSCULAR | Status: DC | PRN

## 2013-02-22 MED ORDER — ROCURONIUM BROMIDE 100 MG/10ML IV SOLN
INTRAVENOUS | Status: DC | PRN
  Administered 2013-02-22: 10 mg via INTRAVENOUS
  Administered 2013-02-22: 50 mg via INTRAVENOUS

## 2013-02-22 MED ORDER — ONDANSETRON HCL 4 MG/2ML IJ SOLN: 4.0000 mg | INTRAMUSCULAR | Status: DC | PRN

## 2013-02-22 MED ORDER — ADULT MULTIVITAMIN W/MINERALS CH: 1.0000 | ORAL_TABLET | Freq: Every day | ORAL | Status: DC

## 2013-02-22 MED ORDER — PHENOL 1.4 % MT LIQD
1.0000 | OROMUCOSAL | Status: DC | PRN
Start: 2013-02-22 — End: 2013-02-22

## 2013-02-22 MED ORDER — LORAZEPAM 1 MG PO TABS
1.0000 mg | ORAL_TABLET | Freq: Once | ORAL | Status: AC
  Administered 2013-02-22: 1 mg via ORAL
  Filled 2013-02-22: qty 1

## 2013-02-22 MED ORDER — LABETALOL HCL 5 MG/ML IV SOLN
INTRAVENOUS | Status: DC | PRN
  Administered 2013-02-22: 5 mg via INTRAVENOUS
  Administered 2013-02-22: 2.5 mg via INTRAVENOUS
  Administered 2013-02-22 (×2): 5 mg via INTRAVENOUS
  Administered 2013-02-22: 2.5 mg via INTRAVENOUS

## 2013-02-22 MED ORDER — SENNA 8.6 MG PO TABS
1.0000 | ORAL_TABLET | Freq: Two times a day (BID) | ORAL | Status: DC
  Administered 2013-02-23 – 2013-03-02 (×14): 8.6 mg via ORAL
  Filled 2013-02-22 (×17): qty 1

## 2013-02-22 MED ORDER — NICARDIPINE HCL IN NACL 20-0.86 MG/200ML-% IV SOLN
3.0000 mg/h | INTRAVENOUS | Status: DC
  Administered 2013-02-22: 3 mg/h via INTRAVENOUS
  Filled 2013-02-22: qty 200

## 2013-02-22 MED ORDER — BISACODYL 10 MG RE SUPP
10.0000 mg | Freq: Every day | RECTAL | Status: DC | PRN
  Administered 2013-02-26: 10 mg via RECTAL
  Filled 2013-02-22: qty 1

## 2013-02-22 MED ORDER — HYDROMORPHONE HCL PF 1 MG/ML IJ SOLN
0.2500 mg | INTRAMUSCULAR | Status: DC | PRN
  Administered 2013-02-22: 0.5 mg via INTRAVENOUS
  Filled 2013-02-22: qty 1

## 2013-02-22 MED ORDER — PANTOPRAZOLE SODIUM 40 MG IV SOLR: 40.0000 mg | Freq: Every day | INTRAVENOUS | Status: DC

## 2013-02-22 MED ORDER — THROMBIN 5000 UNITS EX SOLR
OROMUCOSAL | Status: DC | PRN
  Administered 2013-02-22: 21:00:00 via TOPICAL

## 2013-02-22 SURGICAL SUPPLY — 81 items
ADH SKN CLS APL DERMABOND .7 (GAUZE/BANDAGES/DRESSINGS) ×2
APL SKNCLS STERI-STRIP NONHPOA (GAUZE/BANDAGES/DRESSINGS)
BAG DECANTER FOR FLEXI CONT (MISCELLANEOUS) ×3 IMPLANT
BENZOIN TINCTURE PRP APPL 2/3 (GAUZE/BANDAGES/DRESSINGS) IMPLANT
BLADE SURG 11 STRL SS (BLADE) ×1 IMPLANT
BLADE SURG ROTATE 9660 (MISCELLANEOUS) IMPLANT
BLADE ULTRA TIP 2M (BLADE) IMPLANT
BUR MATCHSTICK NEURO 3.0 LAGG (BURR) ×2 IMPLANT
CANISTER SUCT 3000ML (MISCELLANEOUS) ×3 IMPLANT
CLOSURE WOUND 1/2 X4 (GAUZE/BANDAGES/DRESSINGS)
CONT SPEC 4OZ CLIKSEAL STRL BL (MISCELLANEOUS) ×5 IMPLANT
DERMABOND ADVANCED (GAUZE/BANDAGES/DRESSINGS) ×4
DERMABOND ADVANCED .7 DNX12 (GAUZE/BANDAGES/DRESSINGS) IMPLANT
DRAPE LAPAROTOMY 100X72 PEDS (DRAPES) ×1 IMPLANT
DRAPE LAPAROTOMY 100X72X124 (DRAPES) ×2 IMPLANT
DRAPE MICROSCOPE LEICA (MISCELLANEOUS) ×1 IMPLANT
DRAPE ORTHO SPLIT 77X108 STRL (DRAPES)
DRAPE POUCH INSTRU U-SHP 10X18 (DRAPES) ×3 IMPLANT
DRAPE SURG ORHT 6 SPLT 77X108 (DRAPES) IMPLANT
DRESSING TELFA 8X3 (GAUZE/BANDAGES/DRESSINGS) ×1 IMPLANT
DRSG OPSITE POSTOP 4X8 (GAUZE/BANDAGES/DRESSINGS) ×2 IMPLANT
DURAPREP 26ML APPLICATOR (WOUND CARE) ×2 IMPLANT
DURAPREP 6ML APPLICATOR 50/CS (WOUND CARE) ×1 IMPLANT
ELECT REM PT RETURN 9FT ADLT (ELECTROSURGICAL) ×3
ELECTRODE REM PT RTRN 9FT ADLT (ELECTROSURGICAL) ×1 IMPLANT
EVACUATOR 1/8 PVC DRAIN (DRAIN) ×2 IMPLANT
GAUZE SPONGE 4X4 16PLY XRAY LF (GAUZE/BANDAGES/DRESSINGS) IMPLANT
GLOVE BIO SURGEON STRL SZ 6.5 (GLOVE) ×3 IMPLANT
GLOVE BIO SURGEON STRL SZ8 (GLOVE) ×3 IMPLANT
GLOVE BIO SURGEONS STRL SZ 6.5 (GLOVE) ×3
GLOVE BIOGEL PI IND STRL 6.5 (GLOVE) IMPLANT
GLOVE BIOGEL PI IND STRL 8 (GLOVE) ×1 IMPLANT
GLOVE BIOGEL PI IND STRL 8.5 (GLOVE) ×1 IMPLANT
GLOVE BIOGEL PI INDICATOR 6.5 (GLOVE) ×4
GLOVE BIOGEL PI INDICATOR 8 (GLOVE)
GLOVE BIOGEL PI INDICATOR 8.5 (GLOVE) ×2
GLOVE ECLIPSE 7.5 STRL STRAW (GLOVE) ×1 IMPLANT
GLOVE EXAM NITRILE LRG STRL (GLOVE) IMPLANT
GLOVE EXAM NITRILE MD LF STRL (GLOVE) ×4 IMPLANT
GLOVE EXAM NITRILE XL STR (GLOVE) IMPLANT
GLOVE EXAM NITRILE XS STR PU (GLOVE) IMPLANT
GOWN BRE IMP SLV AUR LG STRL (GOWN DISPOSABLE) IMPLANT
GOWN BRE IMP SLV AUR XL STRL (GOWN DISPOSABLE) IMPLANT
GOWN STRL REIN 2XL LVL4 (GOWN DISPOSABLE) IMPLANT
GOWN STRL REUS W/ TWL LRG LVL3 (GOWN DISPOSABLE) IMPLANT
GOWN STRL REUS W/TWL LRG LVL3 (GOWN DISPOSABLE) ×9
HEMOSTAT SURGICEL 2X14 (HEMOSTASIS) IMPLANT
KIT BASIN OR (CUSTOM PROCEDURE TRAY) ×3 IMPLANT
KIT ROOM TURNOVER OR (KITS) ×3 IMPLANT
MARKER SKIN DUAL TIP RULER LAB (MISCELLANEOUS) ×1 IMPLANT
NDL HYPO 25X1 1.5 SAFETY (NEEDLE) ×1 IMPLANT
NDL SPNL 18GX3.5 QUINCKE PK (NEEDLE) IMPLANT
NDL SPNL 22GX3.5 QUINCKE BK (NEEDLE) ×1 IMPLANT
NEEDLE HYPO 25X1 1.5 SAFETY (NEEDLE) ×3 IMPLANT
NEEDLE SPNL 18GX3.5 QUINCKE PK (NEEDLE) ×9 IMPLANT
NEEDLE SPNL 22GX3.5 QUINCKE BK (NEEDLE) ×3 IMPLANT
NS IRRIG 1000ML POUR BTL (IV SOLUTION) ×3 IMPLANT
PACK LAMINECTOMY NEURO (CUSTOM PROCEDURE TRAY) ×3 IMPLANT
PATTIES SURGICAL .25X.25 (GAUZE/BANDAGES/DRESSINGS) IMPLANT
PATTIES SURGICAL .5 X.5 (GAUZE/BANDAGES/DRESSINGS) IMPLANT
PATTIES SURGICAL .5 X3 (DISPOSABLE) ×2 IMPLANT
RUBBERBAND STERILE (MISCELLANEOUS) ×2 IMPLANT
SPONGE GAUZE 4X4 12PLY (GAUZE/BANDAGES/DRESSINGS) ×1 IMPLANT
SPONGE LAP 4X18 X RAY DECT (DISPOSABLE) IMPLANT
SPONGE NEURO XRAY DETECT 1X3 (DISPOSABLE) ×2 IMPLANT
SPONGE SURGIFOAM ABS GEL 100 (HEMOSTASIS) IMPLANT
STAPLER SKIN PROX WIDE 3.9 (STAPLE) ×1 IMPLANT
STRIP CLOSURE SKIN 1/2X4 (GAUZE/BANDAGES/DRESSINGS) IMPLANT
SUT NURALON 4 0 TR CR/8 (SUTURE) IMPLANT
SUT SILK 6 0 BV 1XDISCX (SUTURE) IMPLANT
SUT VIC AB 0 CT1 18XCR BRD8 (SUTURE) ×1 IMPLANT
SUT VIC AB 0 CT1 8-18 (SUTURE) ×3
SUT VIC AB 2-0 CT1 18 (SUTURE) ×3 IMPLANT
SUT VIC AB 3-0 SH 8-18 (SUTURE) ×3 IMPLANT
SYR 20ML ECCENTRIC (SYRINGE) ×3 IMPLANT
TIP SONASTAR STD MISONIX 1.9 (TRAY / TRAY PROCEDURE) IMPLANT
TOWEL OR 17X24 6PK STRL BLUE (TOWEL DISPOSABLE) ×3 IMPLANT
TOWEL OR 17X26 10 PK STRL BLUE (TOWEL DISPOSABLE) ×3 IMPLANT
TRAY FOLEY CATH 14FRSI W/METER (CATHETERS) IMPLANT
UNDERPAD 30X30 INCONTINENT (UNDERPADS AND DIAPERS) ×1 IMPLANT
WATER STERILE IRR 1000ML POUR (IV SOLUTION) ×3 IMPLANT

## 2013-02-22 NOTE — ED Notes (Signed)
PIV placement attempted x 2, unable to place catheter; blood drawn, labelled and sent to lab.  Pt tolerated well.

## 2013-02-22 NOTE — ED Notes (Signed)
Op Permit signed by pt after procedure explained to her by Dr. Vertell Limber.

## 2013-02-22 NOTE — ED Notes (Signed)
Neurosurgeon in talking with pt and family at this time.

## 2013-02-22 NOTE — ED Notes (Signed)
Transporter here to get pt for MRI. Will give her Ativan prior to transport.

## 2013-02-22 NOTE — ED Notes (Signed)
Pt presents with worsening low back pain that began to upper back x 2-3 months but is now to lower back.  Pt reports onset of bilateral lower leg numbness that began on Monday.  Pt reports numbness resolved on it's own but began again on Tuesday, with resolution.  Pt reports today, she became incontinent of urine, has not had bowel movement x 5 days.

## 2013-02-22 NOTE — ED Notes (Signed)
Pt and husband updated on plan of care.  Neurosurgeon coming to talk to pt.  Pt reminded that she could not have anything to eat or drink until orders are received from Neuro. Pt st's she last ate at 6:00am today.

## 2013-02-22 NOTE — Preoperative (Signed)
Beta Blockers   Reason not to administer Beta Blockers:Not Applicable 

## 2013-02-22 NOTE — Progress Notes (Signed)
eLink Physician-Brief Progress Note Patient Name: Ashley West DOB: 05/06/1940 MRN: [id number]  Date of Service  02/22/2013   HPI/Events of Note   Transferred to ICU after Neurosurgical procedure  eICU Interventions   Vent orders Sedation / pain control ABG / PCXR VTE / GI / VAP Px Bedside MD to see    Intervention Category Major Interventions: Other:  Doree Fudge 02/22/2013, 10:29 PM

## 2013-02-22 NOTE — Transfer of Care (Signed)
Immediate Anesthesia Transfer of Care Note  Patient: Ashley West  Procedure(s) Performed: Procedure(s): THORACIC LAMINECTOMY FOR TUMOR (N/A)  Patient Location: NICU  Anesthesia Type:General  Level of Consciousness: Patient remains intubated per anesthesia plan  Airway & Oxygen Therapy: Patient remains intubated per anesthesia plan and Patient placed on Ventilator (see vital sign flow sheet for setting)  Post-op Assessment: Report given to PACU RN and Post -op Vital signs reviewed and stable  Post vital signs: Reviewed and stable  Complications: No apparent anesthesia complications

## 2013-02-22 NOTE — Consult Note (Signed)
Name: Ashley West MRN: [id number] DOB: 1940/09/22    ADMISSION DATE:  02/22/2013 CONSULTATION DATE:  02/22/2013  REFERRING MD :  Neurosurgery Vertell Limber PRIMARY SERVICE: Neurosurgery  CHIEF COMPLAINT:   Post op vent s/p thoracic spine decompression  BRIEF PATIENT DESCRIPTION:  73 year-old female status post thoracic spine compression for tumor. Patient left on mechanical ventilator overnight and critical care was consult and  SIGNIFICANT EVENTS / STUDIES:    LINES / TUBES: PIV Endotracheal tube 2/12  CULTURES: None  ANTIBIOTICS: Postop Ancef  HISTORY OF PRESENT ILLNESS:   73 year old female who noted the onset of upper back pain for one month. The patient then had progressive low back pain and worsening lower extremity numbness and weakness. The patient progressed to the point where she became incontinent of urine and became obstipated. She came to the primary care office and they evaluated this patient and sent the patient over for further evaluation the emergency room. MRI showed significant thoracic spine involvement of a tumor. The patient was emergently taken to surgery for decompression on 02/22/2013. The patient was left on mechanical ventilation as an expected outcome. The patient also is on antihypertensive drip at this time.  Past history is only positive for hypertension   PAST MEDICAL HISTORY :  Past Medical History  Diagnosis Date  . Hypertension    Past Surgical History  Procedure Laterality Date  . Abdominal hysterectomy    . Cataract extraction      Bilateral  . Cyst excision      Left Elbow, Arm and back  . Esophagogastroduodenoscopy N/A 04/20/2012    Procedure: ESOPHAGOGASTRODUODENOSCOPY (EGD);  Surgeon: Cleotis Nipper, MD;  Location: [facility] ENDOSCOPY;  Service: Endoscopy;  Laterality: N/A;   Prior to Admission medications   Medication Sig Start Date End Date Taking? Authorizing Provider  Ascorbic Acid (VITAMIN C) 500 MG CHEW Chew 500 mg by mouth  daily.   Yes Historical Provider, MD  b complex vitamins tablet Take 1 tablet by mouth daily.   Yes Historical Provider, MD  diclofenac (VOLTAREN) 75 MG EC tablet Take 75 mg by mouth 2 (two) times daily as needed (pain).   Yes Historical Provider, MD  losartan-hydrochlorothiazide (HYZAAR) 50-12.5 MG per tablet Take 1 tablet by mouth daily.   Yes Historical Provider, MD  Multiple Vitamin (MULTIVITAMIN WITH MINERALS) TABS Take 1 tablet by mouth daily.   Yes Historical Provider, MD  omeprazole (PRILOSEC) 20 MG capsule Take 20 mg by mouth daily.   Yes Historical Provider, MD  polyethylene glycol (MIRALAX / GLYCOLAX) packet Take 17 g by mouth daily.   Yes Historical Provider, MD  potassium gluconate 595 MG TABS Take 1,190 mg by mouth. Takes 2 tablets @ 99mg  equivalent each.   Yes Historical Provider, MD  ranitidine (ZANTAC) 150 MG tablet Take 150 mg by mouth 2 (two) times daily.   Yes Historical Provider, MD  vitamin E (VITAMIN E) 400 UNIT capsule Take 400 Units by mouth daily.   Yes Historical Provider, MD   No Known Allergies  FAMILY HISTORY:  No family history on file. SOCIAL HISTORY:  reports that she has never smoked. She does not have any smokeless tobacco history on file. She reports that she drinks about 12.6 ounces of alcohol per week. She reports that she does not use illicit drugs.  REVIEW OF SYSTEMS:   Not obtainable  SUBJECTIVE:   VITAL SIGNS: Temp:  [94.9 F (34.9 C)-98.4 F (36.9 C)] 94.9 F (34.9 C) (02/12 2145)  Pulse Rate:  [59-71] 71 (02/12 2130) Resp:  [16-22] 16 (02/12 2130) BP: (137-175)/(63-89) 163/74 mmHg (02/12 2130) SpO2:  [98 %-100 %] 100 % (02/12 2130) FiO2 (%):  [30 %-50 %] 30 % (02/12 2212) Weight:  [55.339 kg (122 lb)] 55.339 kg (122 lb) (02/12 1015) HEMODYNAMICS:   VENTILATOR SETTINGS: Vent Mode:  [-] PRVC FiO2 (%):  [30 %-50 %] 30 % Set Rate:  [12 bmp-16 bmp] 12 bmp Vt Set:  [400 mL] 400 mL PEEP:  [5 cmH20] 5 cmH20 Plateau Pressure:  [14 cmH20] 14  cmH20 INTAKE / OUTPUT: Intake/Output     02/12 0701 - 02/13 0700   I.V. (mL/kg) 1400 (25.3)   IV Piggyback 250   Total Intake(mL/kg) 1650 (29.8)   Urine (mL/kg/hr) 750   Blood 400   Total Output 1150   Net +500         PHYSICAL EXAMINATION: General:  Endotracheal tube in place in no distress Neuro:  Sedated HEENT:  Dry mucous membranes Cardiovascular:  Regular rate and rhythm normal S1-S2 no S3 or S4 no murmur rub heave or gallop Lungs:  Clear without wheeze rale or rhonchi Abdomen:  Soft nontender bowel sounds hypoactive Musculoskeletal:  Not examined Skin:  Clear  LABS:  CBC  Recent Labs Lab 02/22/13 1126 02/22/13 2037  WBC 6.9  --   HGB 12.2 10.2*  HCT 36.1 30.0*  PLT 397  --    Coag's  Recent Labs Lab 02/22/13 1126 02/22/13 1331  APTT  --  36  INR 1.00 1.02   BMET  Recent Labs Lab 02/22/13 1126 02/22/13 2037  NA 133* 133*  K 4.3 3.5*  CL 92*  --   CO2 27  --   BUN 8  --   CREATININE 0.71  --   GLUCOSE 116*  --    Electrolytes  Recent Labs Lab 02/22/13 1126  CALCIUM 11.6*   Sepsis Markers No results found for this basename: LATICACIDVEN, PROCALCITON, O2SATVEN,  in the last 168 hours ABG  Recent Labs Lab 02/22/13 2037  PHART 7.438  PCO2ART 35.4  PO2ART 277.0*   Liver Enzymes No results found for this basename: AST, ALT, ALKPHOS, BILITOT, ALBUMIN,  in the last 168 hours Cardiac Enzymes No results found for this basename: TROPONINI, PROBNP,  in the last 168 hours Glucose No results found for this basename: GLUCAP,  in the last 168 hours  Imaging Mr Thoracic Spine W Wo Contrast  02/22/2013   CLINICAL DATA:  Progressive body numbness from the nipples down and an increasingly unsteady gait.  EXAM: MRI THORACIC SPINE WITHOUT AND WITH CONTRAST  TECHNIQUE: Multiplanar and multiecho pulse sequences of the thoracic spine were obtained without and with intravenous contrast.  CONTRAST:  52mL MULTIHANCE GADOBENATE DIMEGLUMINE 529 MG/ML IV  SOLN  COMPARISON:  Chest x-ray dated 02/08/2013  FINDINGS: The patient has diffuse osseous metastases throughout the visualized portion of the spine. Tumor invades the spinal canal at T5, T6, and T7 and circumferentially compresses the thecal sac and spinal cord. There is no cortical destruction of the posterior margins of the vertebra at that level. Tumor does extend into the right paraspinal soft tissues at T6.  There are no discrete lesions in the lungs. There is no visible hilar or mediastinal adenopathy and there is no visible tumor in the liver or spleen.  There is no pathologic fracture in the thoracic spine at this time. 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  After contrast administration there is patchy enhancement  of the extensive tumor. There is enhancement of the extradural tumor compressing the spinal cord at the T5-6-7 level. The scout image demonstrates that there is tumor throughout the cervical spine as well.  IMPRESSION: 1. 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 Extradural tumor extends into the spinal canal at T5-6-7 and circumferentially compresses the spinal cord. The appearance is not specific but metastatic breast cancer should be strongly considered. 2. Critical Value/emergent results were called by telephone at the time of interpretation on 02/22/2013 at 4:25 PM to Dr. Calvert Cantor , who verbally acknowledged these results.   Electronically Signed   By: Rozetta Nunnery M.D.   On: 02/22/2013 16:33   Mr Lumbar Spine W Wo Contrast  02/22/2013   CLINICAL DATA:  Increasing numbness from the nipples down over the last few weeks. Increasingly unsteady gait.  EXAM: MRI LUMBAR SPINE WITHOUT AND WITH CONTRAST  TECHNIQUE: Multiplanar and multiecho pulse sequences of the lumbar spine were obtained without and with intravenous contrast.  CONTRAST:  79mL MULTIHANCE GADOBENATE DIMEGLUMINE 529 MG/ML IV SOLN  COMPARISON:  None.  FINDINGS: Tip of the conus is at L2. The  distal spinal cord appears normal. The paraspinal soft tissues are normal. There is no adenopathy or visible abdominal mass lesion. The patient does have extensive diverticulosis of the distal colon.  The patient has patchy tumor throughout the visualized portion of the spine including the sacrum. There is no pathologic fracture. The most extensive tumors and T12 and S1. The discs throughout the lumbar spine are essentially normal. There is moderate right facet arthritis at L3-4 and L4-5 and L5-S1.  There is no visible tumor extension into the spinal canal.  After contrast administration there is enhancement around the arthritic facet joints in the lower lumbar spine. The tumor does not enhance on these images.  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 This is of unknown origin. Given the lack of adenopathy and with no visible lesions in the liver or kidneys and no ascites, I suspect this may represent breast cancer although I have seen this appearance with undifferentiated adenocarcinoma of unknown origin.   Electronically Signed   By: Rozetta Nunnery M.D.   On: 02/22/2013 16:42   Dg Thoracic Spine 1 View  02/22/2013   CLINICAL DATA:  73 year old female with spinal metastatic disease with cord compression in the thoracic spine. Initial encounter.  EXAM: OPERATIVE THORACIC SPINE 1 VIEW(S)  COMPARISON:  Thoracic MRI 02/22/2013.  FINDINGS: Portable intraoperative prone frontal view of the thoracic spine at 1950 hrs.  Some thoracic spine anatomy obscured by overlying retractor hardware. Judging from sagittal views on the comparison there are full size ribs at T12, and no ribs at L1. Based on the lowest visible ribs on this intraoperative image, the cephalad most surgical needle/probe is at the T7-T8 level, the middle localizer is at T10-T11, an the most caudal localizer is at T12-L1. This seems to agree with numbering from the thoracic inlet where the first ribs are visible.  Endotracheal tube tip above the  carina. Enteric tube coursing in the midline to the T11 level, tip likely still within the distal thoracic esophagus.  IMPRESSION: 1. Intraoperative localization as above; most cephalad horizontal surgical marker at T7-T8. 2. Enteric tube tip likely in the distal thoracic esophagus, could be advanced for more optimal placement.   Electronically Signed   By: Lars Pinks M.D.   On: 02/22/2013 20:10     CXR: Chest x-ray is reviewed and  shows no acute process an endotracheal tube in adequate position  ASSESSMENT / PLAN:  PULMONARY A: Expected need for mechanical ventilation after prolonged thoracic spine surgery and anesthesia P:   Will wean off mechanical ventilation in the morning as anesthesia is weaned off No other active pulmonary issues identified  CARDIOVASCULAR A: Hypertension poorly controlled P:  Nicardipine drip Goal systolic pressure less than 160 and will follow noninvasive  RENAL A:  No active process P:   Monitor  GASTROINTESTINAL A:  No active process P:   Monitor  HEMATOLOGIC A:  No acute process P:  Monitor  INFECTIOUS A:  No active process P:   Monitor  ENDOCRINE A:   No active process   P:   Mod  NEUROLOGIC A:  Status post thoracic spine decompression with thoracic spinal tumor P:   Per neurosurgery  TODAY'S SUMMARY:  73 year old African American female who has undergone thoracic spine decompression now on mechanical ventilation as expected postop plan is for full ventilatory support overnight on expected basis because of prolonged anesthesia. No active pulmonary process identified. The plan will be to extubate within the next 7 hours  I have personally obtained a history, examined the patient, evaluated laboratory and imaging results, formulated the assessment and plan and placed orders. CRITICAL CARE: The patient is critically ill with multiple organ systems failure and requires high complexity decision making for assessment and support, frequent  evaluation and titration of therapies, application of advanced monitoring technologies and extensive interpretation of multiple databases. Critical Care Time devoted to patient care services described in this note is 40 minutes.   Mariel Sleet Beeper  [phone]  Cell  [phone]  If no response or cell goes to voicemail, call beeper [phone]  Pulmonary and [place] Pager: [phone]  02/22/2013, 11:08 PM

## 2013-02-22 NOTE — Anesthesia Preprocedure Evaluation (Signed)
Anesthesia Evaluation  Patient identified by MRN, date of birth, ID band Patient awake    Reviewed: Allergy & Precautions, H&P , NPO status , Patient's Chart, lab work & pertinent test results  Airway       Dental   Pulmonary          Cardiovascular hypertension,     Neuro/Psych    GI/Hepatic (+)     substance abuse  alcohol use,   Endo/Other    Renal/GU      Musculoskeletal   Abdominal   Peds  Hematology  (+) anemia ,   Anesthesia Other Findings   Reproductive/Obstetrics                           Anesthesia Physical Anesthesia Plan  ASA: III and emergent  Anesthesia Plan: General   Post-op Pain Management:    Induction: Intravenous  Airway Management Planned: Oral ETT  Additional Equipment: Arterial line  Intra-op Plan:   Post-operative Plan: Possible Post-op intubation/ventilation  Informed Consent: I have reviewed the patients History and Physical, chart, labs and discussed the procedure including the risks, benefits and alternatives for the proposed anesthesia with the patient or authorized representative who has indicated his/her understanding and acceptance.     Plan Discussed with:   Anesthesia Plan Comments:         Anesthesia Quick Evaluation

## 2013-02-22 NOTE — ED Notes (Signed)
Pt assisted, with aid of spouse, to standing position for 5 minutes to relieve pain.

## 2013-02-22 NOTE — ED Provider Notes (Signed)
CSN: [id number]     Arrival date & time 02/22/13  1005 History   First MD Initiated Contact with Patient 02/22/13 1109     Chief Complaint  Patient presents with  . Numbness     (Consider location/radiation/quality/duration/timing/severity/associated sxs/prior Treatment) HPI Pt reports about a month ago she had upper back pain, which has since resolved. Over the last few days she has had low back pain, progressively worsening, associated with bilateral leg numbness and weakness, initially waxing and waning but now more severe and constant over the last 24hrs. She has also been having incontinence of urine and constipation. This morning noticed numbness to buttocks and abdominal wall to about the level of umbilicus. Sent from PCP office for further eval. Denies any falls or injuries. She has not had any fever. No reported IVDA.   Past Medical History  Diagnosis Date  . Hypertension    Past Surgical History  Procedure Laterality Date  . Abdominal hysterectomy    . Cataract extraction      Bilateral  . Cyst excision      Left Elbow, Arm and back  . Esophagogastroduodenoscopy N/A 04/20/2012    Procedure: ESOPHAGOGASTRODUODENOSCOPY (EGD);  Surgeon: Cleotis Nipper, MD;  Location: [facility] ENDOSCOPY;  Service: Endoscopy;  Laterality: N/A;   No family history on file. History  Substance Use Topics  . Smoking status: Never Smoker   . Smokeless tobacco: Not on file  . Alcohol Use: 12.6 oz/week    21 Cans of beer per week     Comment: everyday   OB History   Grav Para Term Preterm Abortions TAB SAB Ect Mult Living                 Review of Systems  All other systems reviewed and are negative except as noted in HPI.    Allergies  Review of patient's allergies indicates no known allergies.  Home Medications   Current Outpatient Rx  Name  Route  Sig  Dispense  Refill  . aspirin 81 MG chewable tablet   Oral   Chew 1 tablet (81 mg total) by mouth daily. STOP ASPIRIN FOR 2 WEEKS          . b complex vitamins tablet   Oral   Take 1 tablet by mouth daily.         . ferrous fumarate (HEMOCYTE - 106 MG FE) 325 (106 FE) MG TABS   Oral   Take 1 tablet (106 mg of iron total) by mouth 2 (two) times daily.   60 each   2   . losartan-hydrochlorothiazide (HYZAAR) 50-12.5 MG per tablet   Oral   Take 1 tablet by mouth daily.         . Multiple Vitamin (MULTIVITAMIN WITH MINERALS) TABS   Oral   Take 1 tablet by mouth daily.         . polyethylene glycol (MIRALAX / GLYCOLAX) packet   Oral   Take 17 g by mouth 2 (two) times daily.   14 each   2   . potassium gluconate 595 MG TABS   Oral   Take 1,190 mg by mouth. Takes 2 tablets @ 99mg  equivalent each.         . vitamin C (ASCORBIC ACID) 250 MG tablet   Oral   Take 250 mg by mouth daily.         . vitamin E (VITAMIN E) 400 UNIT capsule   Oral   Take 400  Units by mouth daily.          BP 151/74  Pulse 65  Temp(Src) 98.4 F (36.9 C) (Oral)  Resp 22  Ht 5.75" (0.146 m)  Wt 122 lb (55.339 kg)  BMI 2596.12 kg/m2  SpO2 98% Physical Exam  Nursing note and vitals reviewed. Constitutional: She is oriented to person, place, and time. She appears well-developed and well-nourished.  HENT:  Head: Normocephalic and atraumatic.  Eyes: EOM are normal. Pupils are equal, round, and reactive to light.  Neck: Normal range of motion. Neck supple.  Cardiovascular: Normal rate, normal heart sounds and intact distal pulses.   Pulmonary/Chest: Effort normal and breath sounds normal.  Abdominal: Bowel sounds are normal. She exhibits no distension. There is no tenderness.  Musculoskeletal: Normal range of motion. She exhibits no edema and no tenderness.  Neurological: She is alert and oriented to person, place, and time. A sensory deficit is present. No cranial nerve deficit.  Decreased sensation to BLE to the buttocks, perineum and up to level of umbilicus, markedly decreased strength in BLE, LE DTR diminished but  equal, no clonus of feet  Skin: Skin is warm and dry. No rash noted.  Psychiatric: She has a normal mood and affect.    ED Course  Procedures (including critical care time) Labs Review Labs Reviewed  CBC WITH DIFFERENTIAL - Abnormal; Notable for the following:    Neutrophils Relative % 84 (*)    Lymphocytes Relative 9 (*)    All other components within normal limits  BASIC METABOLIC PANEL - Abnormal; Notable for the following:    Sodium 133 (*)    Chloride 92 (*)    Glucose, Bld 116 (*)    Calcium 11.6 (*)    GFR calc non Af Amer 85 (*)    All other components within normal limits  PROTIME-INR  PROTIME-INR  APTT  URINALYSIS, ROUTINE W REFLEX MICROSCOPIC   Imaging Review Mr Thoracic Spine W Wo Contrast  02/22/2013   CLINICAL DATA:  Progressive body numbness from the nipples down and an increasingly unsteady gait.  EXAM: MRI THORACIC SPINE WITHOUT AND WITH CONTRAST  TECHNIQUE: Multiplanar and multiecho pulse sequences of the thoracic spine were obtained without and with intravenous contrast.  CONTRAST:  31mL MULTIHANCE GADOBENATE DIMEGLUMINE 529 MG/ML IV SOLN  COMPARISON:  Chest x-ray dated 02/08/2013  FINDINGS: The patient has diffuse osseous metastases throughout the visualized portion of the spine. Tumor invades the spinal canal at T5, T6, and T7 and circumferentially compresses the thecal sac and spinal cord. There is no cortical destruction of the posterior margins of the vertebra at that level. Tumor does extend into the right paraspinal soft tissues at T6.  There are no discrete lesions in the lungs. There is no visible hilar or mediastinal adenopathy and there is no visible tumor in the liver or spleen.  There is no pathologic fracture in the thoracic spine at this time. 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  After contrast administration there is patchy enhancement of the extensive tumor. There is enhancement of the extradural tumor compressing the spinal cord at the T5-6-7 level. The  scout image demonstrates that there is tumor throughout the cervical spine as well.  IMPRESSION: 1. 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 Extradural tumor extends into the spinal canal at T5-6-7 and circumferentially compresses the spinal cord. The appearance is not specific but metastatic breast cancer should be strongly considered. 2. Critical Value/emergent results were called by telephone  at the time of interpretation on 02/22/2013 at 4:25 PM to Dr. Calvert Cantor , who verbally acknowledged these results.   Electronically Signed   By: Rozetta Nunnery M.D.   On: 02/22/2013 16:33   Mr Lumbar Spine W Wo Contrast  02/22/2013   CLINICAL DATA:  Increasing numbness from the nipples down over the last few weeks. Increasingly unsteady gait.  EXAM: MRI LUMBAR SPINE WITHOUT AND WITH CONTRAST  TECHNIQUE: Multiplanar and multiecho pulse sequences of the lumbar spine were obtained without and with intravenous contrast.  CONTRAST:  62mL MULTIHANCE GADOBENATE DIMEGLUMINE 529 MG/ML IV SOLN  COMPARISON:  None.  FINDINGS: Tip of the conus is at L2. The distal spinal cord appears normal. The paraspinal soft tissues are normal. There is no adenopathy or visible abdominal mass lesion. The patient does have extensive diverticulosis of the distal colon.  The patient has patchy tumor throughout the visualized portion of the spine including the sacrum. There is no pathologic fracture. The most extensive tumors and T12 and S1. The discs throughout the lumbar spine are essentially normal. There is moderate right facet arthritis at L3-4 and L4-5 and L5-S1.  There is no visible tumor extension into the spinal canal.  After contrast administration there is enhancement around the arthritic facet joints in the lower lumbar spine. The tumor does not enhance on these images.  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 This is of unknown origin. Given the lack of adenopathy and with no visible  lesions in the liver or kidneys and no ascites, I suspect this may represent breast cancer although I have seen this appearance with undifferentiated adenocarcinoma of unknown origin.   Electronically Signed   By: Rozetta Nunnery M.D.   On: 02/22/2013 16:42    EKG Interpretation   None       MDM   Final diagnoses:  Metastatic cancer  Paraplegia     Discussed MRI results with Radiologist and with Dr. Vertell Limber on call for Neurosurgery who will come evaluate the patient. Pt and husband were informed of MRI findings and concern for metastatic cancer although no primary is known.     Gladyes Kudo B. Karle Starch, MD 02/22/13 1714

## 2013-02-22 NOTE — ED Notes (Signed)
Pt requesting to use bathroom. Offered bed pain but refused. Adamantly refused bed pan. Reports pt family will help her to the bathroom. Bed pushed to bathroom, non-slip socks applied.

## 2013-02-22 NOTE — ED Notes (Signed)
Pt st's she has been moving lower legs some since returning from MRI.  Pt denies any pain or discomfort at this time.  Pt resting.

## 2013-02-22 NOTE — Anesthesia Postprocedure Evaluation (Signed)
  Anesthesia Post-op Note  Patient: Ashley West  Procedure(s) Performed: Procedure(s): THORACIC LAMINECTOMY FOR TUMOR (N/A)  Patient Location: NICU  Anesthesia Type:General  Level of Consciousness: sedated and Patient remains intubated per anesthesia plan  Airway and Oxygen Therapy: Patient remains intubated per anesthesia plan and Patient placed on Ventilator (see vital sign flow sheet for setting)  Post-op Pain: none  Post-op Assessment: Post-op Vital signs reviewed, Patient's Cardiovascular Status Stable, RESPIRATORY FUNCTION UNSTABLE, Patent Airway, No signs of Nausea or vomiting and Pain level controlled  Post-op Vital Signs: stable  Complications: No apparent anesthesia complications

## 2013-02-22 NOTE — Op Note (Signed)
02/22/2013  9:36 PM  PATIENT:  Ashley West  72 y.o. 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  PROCEDURE:  Procedure(s): THORACIC LAMINECTOMY FOR TUMOR (N/A) T 5 - T 8  SURGEON:  Surgeon(s) and Role:    * Flor Houdeshell, MD - Primary  PHYSICIAN ASSISTANT:   ASSISTANTS: none   ANESTHESIA:   general  EBL:  Total I/O In: 1650 [I.V.:1400; IV Piggyback:250] Out: 1150 [Urine:750; Blood:400]  BLOOD ADMINISTERED:none  DRAINS: (Medium) Hemovact drain(s) in the epidural space with  Suction Open   LOCAL MEDICATIONS USED:  LIDOCAINE   SPECIMEN:  Excision  DISPOSITION OF SPECIMEN:  PATHOLOGY  COUNTS:  YES  TOURNIQUET:  * No tourniquets in log *  DICTATION: Patient has spinal cord compression at T5-T7 due to metastatic cancer of unknown primary with bilateral leg weakness and numbness and pain. It was elected to take her to surgery for T5-T8 laminectomy and resection of epidural metastasis.  Procedure: Patient was brought to the operating room and following the smooth and uncomplicated induction of general endotracheal anesthesia she was placed in a prone position on the Wilson frame. 3, 18-gauge spinal needles were taped to her back and an AP radiograph was obtained to localize the T7 level. Her back was prepped and draped in the usual sterile fashion with DuraPrep. Area of planned incision was infiltrated with local lidocaine. Incision was made in the midline and carried to the lumbodorsal fascia which was incised bilaterally. A total laminectomy of T 5 through T 8  was performed with leksell, and the spinal cord dura was defined. The tumor was then peeled away from the spinal cord dura which was widely decompressed. A large mass lateral to the cord on the right was identified and then  removed on both sides.  Thoracic nerve roots appeared infiltrated with tumor but were preserved.  At this point it was felt that all neural elements were well decompressed. The wound was then irrigated with saline. Hemostasis was assured with bipolar electrocautery and gelfoam, followed by Surgifoam. A medium Hemovac drain was placed in the epidural space and anchored with a vicryl stitch.The thoracodorsal fascia was closed with 0 Vicryl sutures the subcutaneous tissues reapproximated 2-0 Vicryl inverted sutures and the skin edges were reapproximated with 3-0 Vicryl subcuticular stitch. The wound is dressed with Dermabond and an occlusive dressing. Patient was taken from the operating room to the ICU intubated in stable and satisfactory condition having tolerated her operation well counts were correct at the end of the case.   PLAN OF CARE: Admit to inpatient   PATIENT DISPOSITION:  PACU - hemodynamically stable.   Delay start of Pharmacological VTE agent (>24hrs) due to surgical blood loss or risk of bleeding: yes  

## 2013-02-22 NOTE — ED Notes (Signed)
PT sent here by pcp for r/o spinal chord compression.  Pt with increasing lower extremity numbness that, today, has become inability to move lower extremities.

## 2013-02-22 NOTE — H&P (Signed)
History   First MD Initiated Contact with Patient 02/22/13 1109  Chief Complaint   Patient presents with   .  Numbness    HPI  Pt reports about a month ago she had upper back pain, which has since resolved. Over the last few days she has had low back pain, progressively worsening, associated with bilateral leg numbness and weakness, initially waxing and waning but now more severe and constant over the last 24hrs. She has also been having incontinence of urine and constipation. This morning noticed numbness to buttocks and abdominal wall to about the level of umbilicus. Sent from PCP office for further eval. Denies any falls or injuries. She has not had any fever. No reported IVDA.  Past Medical History   Diagnosis  Date   .  Hypertension     Past Surgical History   Procedure  Laterality  Date   .  Abdominal hysterectomy     .  Cataract extraction       Bilateral   .  Cyst excision       Left Elbow, Arm and back   .  Esophagogastroduodenoscopy  N/A  04/20/2012     Procedure: ESOPHAGOGASTRODUODENOSCOPY (EGD); Surgeon: Cleotis Nipper, MD; Location: [facility] ENDOSCOPY; Service: Endoscopy; Laterality: N/A;    No family history on file.  History   Substance Use Topics   .  Smoking status:  Never Smoker   .  Smokeless tobacco:  Not on file   .  Alcohol Use:  12.6 oz/week     21 Cans of beer per week      Comment: everyday    OB History    Grav  Para  Term  Preterm  Abortions  TAB  SAB  Ect  Mult  Living                  Review of Systems  All other systems reviewed and are negative except as noted in HPI.  Allergies   Review of patient's allergies indicates no known allergies.  Home Medications    Current Outpatient Rx   Name   Route   Sig   Dispense   Refill   .  aspirin 81 MG chewable tablet   Oral   Chew 1 tablet (81 mg total) by mouth daily. STOP ASPIRIN FOR 2 WEEKS       .  b complex vitamins tablet   Oral   Take 1 tablet by mouth daily.       .  ferrous fumarate (HEMOCYTE -  106 MG FE) 325 (106 FE) MG TABS   Oral   Take 1 tablet (106 mg of iron total) by mouth 2 (two) times daily.   60 each   2   .  losartan-hydrochlorothiazide (HYZAAR) 50-12.5 MG per tablet   Oral   Take 1 tablet by mouth daily.       .  Multiple Vitamin (MULTIVITAMIN WITH MINERALS) TABS   Oral   Take 1 tablet by mouth daily.       .  polyethylene glycol (MIRALAX / GLYCOLAX) packet   Oral   Take 17 g by mouth 2 (two) times daily.   14 each   2   .  potassium gluconate 595 MG TABS   Oral   Take 1,190 mg by mouth. Takes 2 tablets @ $RemoveB'99mg'tEHSyVpd$  equivalent each.       .  vitamin C (ASCORBIC ACID) 250 MG tablet   Oral   Take  250 mg by mouth daily.       .  vitamin E (VITAMIN E) 400 UNIT capsule   Oral   Take 400 Units by mouth daily.        BP 151/74  Pulse 65  Temp(Src) 98.4 F (36.9 C) (Oral)  Resp 22  Ht 5.75" (0.146 m)  Wt 122 lb (55.339 kg)  BMI 2596.12 kg/m2  SpO2 98%  Physical Exam  Nursing note and vitals reviewed.  Constitutional: She is oriented to person, place, and time. She appears well-developed and well-nourished.  HENT:  Head: Normocephalic and atraumatic.  Eyes: EOM are normal. Pupils are equal, round, and reactive to light.  Neck: Normal range of motion. Neck supple.  Cardiovascular: Normal rate, normal heart sounds and intact distal pulses.  Pulmonary/Chest: Effort normal and breath sounds normal.  Abdominal: Bowel sounds are normal. She exhibits no distension. There is no tenderness.  Musculoskeletal: Normal range of motion. She exhibits no edema and no tenderness.  Neurological: She is alert and oriented to person, place, and time. A sensory deficit is present. No cranial nerve deficit. Patient has full bilateral upper extremity strength and  Is very weak in both legs.  She has antigravity strength in the  Hip flexors and minimal movement in quadriceps and feet in both DF and PF.  Decreased sensation to BLE to the buttocks, perineum and up to level of umbilicus, markedly decreased  strength in BLE, LE DTR diminished but equal, no clonus of feet  Skin: Skin is warm and dry. No rash noted.  Psychiatric: She has a normal mood and affect.   ED Course   Procedures (including critical care time)  Labs Review  Labs Reviewed   CBC WITH DIFFERENTIAL - Abnormal; Notable for the following:    Neutrophils Relative %  84 (*)     Lymphocytes Relative  9 (*)     All other components within normal limits   BASIC METABOLIC PANEL - Abnormal; Notable for the following:    Sodium  133 (*)     Chloride  92 (*)     Glucose, Bld  116 (*)     Calcium  11.6 (*)     GFR calc non Af Amer  85 (*)     All other components within normal limits   PROTIME-INR   PROTIME-INR   APTT   URINALYSIS, ROUTINE W REFLEX MICROSCOPIC    Imaging Review  Mr Thoracic Spine W Wo Contrast  02/22/2013 CLINICAL DATA: Progressive body numbness from the nipples down and an increasingly unsteady gait. EXAM: MRI THORACIC SPINE WITHOUT AND WITH CONTRAST TECHNIQUE: Multiplanar and multiecho pulse sequences of the thoracic spine were obtained without and with intravenous contrast. CONTRAST: 34mL MULTIHANCE GADOBENATE DIMEGLUMINE 529 MG/ML IV SOLN COMPARISON: Chest x-ray dated 02/08/2013 FINDINGS: The patient has diffuse osseous metastases throughout the visualized portion of the spine. Tumor invades the spinal canal at T5, T6, and T7 and circumferentially compresses the thecal sac and spinal cord. There is no cortical destruction of the posterior margins of the vertebra at that level. Tumor does extend into the right paraspinal soft tissues at T6. There are no discrete lesions in the lungs. There is no visible hilar or mediastinal adenopathy and there is no visible tumor in the liver or spleen. There is no pathologic fracture in the thoracic spine at this time. 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 After contrast administration there is patchy enhancement of the extensive tumor. There is enhancement of the  extradural tumor  compressing the spinal cord at the T5-6-7 level. The scout image demonstrates that there is tumor throughout the cervical spine as well. IMPRESSION: 1. 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 Extradural tumor extends into the spinal canal at T5-6-7 and circumferentially compresses the spinal cord. The appearance is not specific but metastatic breast cancer should be strongly considered. 2. Critical Value/emergent results were called by telephone at the time of interpretation on 02/22/2013 at 4:25 PM to Dr. Calvert Cantor , who verbally acknowledged these results. Electronically Signed By: Rozetta Nunnery M.D. On: 02/22/2013 16:33  Mr Lumbar Spine W Wo Contrast  02/22/2013 CLINICAL DATA: Increasing numbness from the nipples down over the last few weeks. Increasingly unsteady gait. EXAM: MRI LUMBAR SPINE WITHOUT AND WITH CONTRAST TECHNIQUE: Multiplanar and multiecho pulse sequences of the lumbar spine were obtained without and with intravenous contrast. CONTRAST: 41mL MULTIHANCE GADOBENATE DIMEGLUMINE 529 MG/ML IV SOLN COMPARISON: None. FINDINGS: Tip of the conus is at L2. The distal spinal cord appears normal. The paraspinal soft tissues are normal. There is no adenopathy or visible abdominal mass lesion. The patient does have extensive diverticulosis of the distal colon. The patient has patchy tumor throughout the visualized portion of the spine including the sacrum. There is no pathologic fracture. The most extensive tumors and T12 and S1. The discs throughout the lumbar spine are essentially normal. There is moderate right facet arthritis at L3-4 and L4-5 and L5-S1. There is no visible tumor extension into the spinal canal. After contrast administration there is enhancement around the arthritic facet joints in the lower lumbar spine. The tumor does not enhance on these images. 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 This is of unknown origin. Given the lack of  adenopathy and with no visible lesions in the liver or kidneys and no ascites, I suspect this may represent breast cancer although I have seen this appearance with undifferentiated adenocarcinoma of unknown origin. Electronically Signed By: Rozetta Nunnery M.D. On: 02/22/2013 16:42   EKG Interpretation    None      MDM    Final diagnoses:   Metastatic cancer of unknown primary  Paraparesis    Pt and husband were informed of MRI findings and concern for metastatic cancer although no primary is known. I met with patient and her family and discussed imaging and exam findings.  I have also reviewed imaging with Dr. Lisbeth Renshaw from Radiation Oncology.  I have recommended proceeding with emergent decompression of thoracic spinal cord to hopefully retain motor function.  We will obtain tissue specimen for Pathology.  Risks of surgery and potential for no improvement or worsening were discussed with patient.  She wishes to proceed.

## 2013-02-22 NOTE — Brief Op Note (Signed)
02/22/2013  9:36 PM  PATIENT:  Ashley West  73 y.o. 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  PROCEDURE:  Procedure(s): THORACIC LAMINECTOMY FOR TUMOR (N/A) T 5 - T 8  SURGEON:  Surgeon(s) and Role:    * Erline Levine, MD - Primary  PHYSICIAN ASSISTANT:   ASSISTANTS: none   ANESTHESIA:   general  EBL:  Total I/O In: 6962 [I.V.:1400; IV Piggyback:250] Out: 1150 [Urine:750; Blood:400]  BLOOD ADMINISTERED:none  DRAINS: (Medium) Hemovact drain(s) in the epidural space with  Suction Open   LOCAL MEDICATIONS USED:  LIDOCAINE   SPECIMEN:  Excision  DISPOSITION OF SPECIMEN:  PATHOLOGY  COUNTS:  YES  TOURNIQUET:  * No tourniquets in log *  DICTATION: Patient has spinal cord compression at T5-T7 due to metastatic cancer of unknown primary with bilateral leg weakness and numbness and pain. It was elected to take her to surgery for T5-T8 laminectomy and resection of epidural metastasis.  Procedure: Patient was brought to the operating room and following the smooth and uncomplicated induction of general endotracheal anesthesia she was placed in a prone position on the Wilson frame. 3, 18-gauge spinal needles were taped to her back and an AP radiograph was obtained to localize the T7 level. Her back was prepped and draped in the usual sterile fashion with DuraPrep. Area of planned incision was infiltrated with local lidocaine. Incision was made in the midline and carried to the lumbodorsal fascia which was incised bilaterally. A total laminectomy of T 5 through T 8  was performed with leksell, and the spinal cord dura was defined. The tumor was then peeled away from the spinal cord dura which was widely decompressed. A large mass lateral to the cord on the right was identified and then  removed on both sides.  Thoracic nerve roots appeared infiltrated with tumor but were preserved.  At this point it was felt that all neural elements were well decompressed. The wound was then irrigated with saline. Hemostasis was assured with bipolar electrocautery and gelfoam, followed by Surgifoam. A medium Hemovac drain was placed in the epidural space and anchored with a vicryl stitch.The thoracodorsal fascia was closed with 0 Vicryl sutures the subcutaneous tissues reapproximated 2-0 Vicryl inverted sutures and the skin edges were reapproximated with 3-0 Vicryl subcuticular stitch. The wound is dressed with Dermabond and an occlusive dressing. Patient was taken from the operating room to the ICU intubated in stable and satisfactory condition having tolerated her operation well counts were correct at the end of the case.   PLAN OF CARE: Admit to inpatient   PATIENT DISPOSITION:  PACU - hemodynamically stable.   Delay start of Pharmacological VTE agent (>24hrs) due to surgical blood loss or risk of bleeding: yes

## 2013-02-23 ENCOUNTER — Encounter (HOSPITAL_COMMUNITY): Payer: Self-pay | Admitting: Neurosurgery

## 2013-02-23 DIAGNOSIS — K5731 Diverticulosis of large intestine without perforation or abscess with bleeding: Secondary | ICD-10-CM

## 2013-02-23 DIAGNOSIS — F1010 Alcohol abuse, uncomplicated: Secondary | ICD-10-CM

## 2013-02-23 LAB — URINE MICROSCOPIC-ADD ON

## 2013-02-23 LAB — MRSA PCR SCREENING: MRSA BY PCR: NEGATIVE

## 2013-02-23 LAB — URINALYSIS, ROUTINE W REFLEX MICROSCOPIC
BILIRUBIN URINE: NEGATIVE
GLUCOSE, UA: NEGATIVE mg/dL
Hgb urine dipstick: NEGATIVE
KETONES UR: 15 mg/dL — AB
NITRITE: NEGATIVE
PROTEIN: NEGATIVE mg/dL
Specific Gravity, Urine: 1.027 (ref 1.005–1.030)
Urobilinogen, UA: 0.2 mg/dL (ref 0.0–1.0)
pH: 6 (ref 5.0–8.0)

## 2013-02-23 LAB — BLOOD PRODUCT ORDER (VERBAL) VERIFICATION

## 2013-02-23 MED ORDER — HYDRALAZINE HCL 20 MG/ML IJ SOLN
10.0000 mg | INTRAMUSCULAR | Status: DC | PRN
Start: 2013-02-23 — End: 2013-03-02
  Filled 2013-02-23: qty 1

## 2013-02-23 NOTE — Progress Notes (Signed)
UR completed.  Benino Korinek, RN BSN M[facility] CCM Trauma/Neuro ICU Case Manager [phone]  

## 2013-02-23 NOTE — Progress Notes (Signed)
Subjective: Patient reports intubated, sedated  Objective: Vital signs in last 24 hours: Temp:  [94.9 F (34.9 C)-98.5 F (36.9 C)] 98.5 F (36.9 C) (02/13 0327) Pulse Rate:  [59-96] 72 (02/13 0700) Resp:  [11-22] 18 (02/13 0700) BP: (114-175)/(52-89) 138/64 mmHg (02/13 0700) SpO2:  [98 %-100 %] 100 % (02/13 0700) Arterial Line BP: (148-238)/(51-87) 168/64 mmHg (02/13 0700) FiO2 (%):  [30 %-50 %] 30 % (02/13 0400) Weight:  [55.339 kg (122 lb)-60 kg (132 lb 4.4 oz)] 60 kg (132 lb 4.4 oz) (02/12 2300)  Intake/Output from previous day: 02/12 0701 - 02/13 0700 In: 2443.2 [I.V.:2143.2; IV Piggyback:300] Out: 5009 [Urine:1360; Drains:25; Blood:400] Intake/Output this shift:    Physical Exam: Moves all extremities to command.  Weakly moving legs.  Exam stable to improved from preop.  Dressing CDI  Lab Results:  Recent Labs  02/22/13 1126 02/22/13 2037  WBC 6.9  --   HGB 12.2 10.2*  HCT 36.1 30.0*  PLT 397  --    BMET  Recent Labs  02/22/13 1126 02/22/13 2037  NA 133* 133*  K 4.3 3.5*  CL 92*  --   CO2 27  --   GLUCOSE 116*  --   BUN 8  --   CREATININE 0.71  --   CALCIUM 11.6*  --     Studies/Results: Mr Thoracic Spine W Wo Contrast  02/22/2013   CLINICAL DATA:  Progressive body numbness from the nipples down and an increasingly unsteady gait.  EXAM: MRI THORACIC SPINE WITHOUT AND WITH CONTRAST  TECHNIQUE: Multiplanar and multiecho pulse sequences of the thoracic spine were obtained without and with intravenous contrast.  CONTRAST:  24mL MULTIHANCE GADOBENATE DIMEGLUMINE 529 MG/ML IV SOLN  COMPARISON:  Chest x-ray dated 02/08/2013  FINDINGS: The patient has diffuse osseous metastases throughout the visualized portion of the spine. Tumor invades the spinal canal at T5, T6, and T7 and circumferentially compresses the thecal sac and spinal cord. There is no cortical destruction of the posterior margins of the vertebra at that level. Tumor does extend into the right  paraspinal soft tissues at T6.  There are no discrete lesions in the lungs. There is no visible hilar or mediastinal adenopathy and there is no visible tumor in the liver or spleen.  There is no pathologic fracture in the thoracic spine at this time. 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  After contrast administration there is patchy enhancement of the extensive tumor. There is enhancement of the extradural tumor compressing the spinal cord at the T5-6-7 level. The scout image demonstrates that there is tumor throughout the cervical spine as well.  IMPRESSION: 1. 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 Extradural tumor extends into the spinal canal at T5-6-7 and circumferentially compresses the spinal cord. The appearance is not specific but metastatic breast cancer should be strongly considered. 2. Critical Value/emergent results were called by telephone at the time of interpretation on 02/22/2013 at 4:25 PM to Dr. Calvert Cantor , who verbally acknowledged these results.   Electronically Signed   By: Rozetta Nunnery M.D.   On: 02/22/2013 16:33   Mr Lumbar Spine W Wo Contrast  02/22/2013   CLINICAL DATA:  Increasing numbness from the nipples down over the last few weeks. Increasingly unsteady gait.  EXAM: MRI LUMBAR SPINE WITHOUT AND WITH CONTRAST  TECHNIQUE: Multiplanar and multiecho pulse sequences of the lumbar spine were obtained without and with intravenous contrast.  CONTRAST:  23mL MULTIHANCE GADOBENATE DIMEGLUMINE 529 MG/ML IV  SOLN  COMPARISON:  None.  FINDINGS: Tip of the conus is at L2. The distal spinal cord appears normal. The paraspinal soft tissues are normal. There is no adenopathy or visible abdominal mass lesion. The patient does have extensive diverticulosis of the distal colon.  The patient has patchy tumor throughout the visualized portion of the spine including the sacrum. There is no pathologic fracture. The most extensive tumors and T12 and S1. The discs  throughout the lumbar spine are essentially normal. There is moderate right facet arthritis at L3-4 and L4-5 and L5-S1.  There is no visible tumor extension into the spinal canal.  After contrast administration there is enhancement around the arthritic facet joints in the lower lumbar spine. The tumor does not enhance on these images.  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 This is of unknown origin. Given the lack of adenopathy and with no visible lesions in the liver or kidneys and no ascites, I suspect this may represent breast cancer although I have seen this appearance with undifferentiated adenocarcinoma of unknown origin.   Electronically Signed   By: Rozetta Nunnery M.D.   On: 02/22/2013 16:42   Dg Thoracic Spine 1 View  02/22/2013   CLINICAL DATA:  73 year old female with spinal metastatic disease with cord compression in the thoracic spine. Initial encounter.  EXAM: OPERATIVE THORACIC SPINE 1 VIEW(S)  COMPARISON:  Thoracic MRI 02/22/2013.  FINDINGS: Portable intraoperative prone frontal view of the thoracic spine at 1950 hrs.  Some thoracic spine anatomy obscured by overlying retractor hardware. Judging from sagittal views on the comparison there are full size ribs at T12, and no ribs at L1. Based on the lowest visible ribs on this intraoperative image, the cephalad most surgical needle/probe is at the T7-T8 level, the middle localizer is at T10-T11, an the most caudal localizer is at T12-L1. This seems to agree with numbering from the thoracic inlet where the first ribs are visible.  Endotracheal tube tip above the carina. Enteric tube coursing in the midline to the T11 level, tip likely still within the distal thoracic esophagus.  IMPRESSION: 1. Intraoperative localization as above; most cephalad horizontal surgical marker at T7-T8. 2. Enteric tube tip likely in the distal thoracic esophagus, could be advanced for more optimal placement.   Electronically Signed   By: Lars Pinks M.D.   On:  02/22/2013 20:10   Dg Chest Port 1 View  02/23/2013   CLINICAL DATA:  Intubated  EXAM: PORTABLE CHEST - 1 VIEW  COMPARISON:  February 08, 2013  FINDINGS: The heart size and mediastinal contours are within normal limits. There is no focal infiltrate, pulmonary edema, or pleural effusion. Endotracheal tube is identified distal tip 3.4 cm from carina. There is no pneumothorax. The previously noted calcified granuloma is not as well appreciated on this exam. The visualized skeletal structures are stable.  IMPRESSION: Endotracheal tube distal tip 3.4 cm from carina. There is no pneumothorax. No acute cardiopulmonary disease is identified.   Electronically Signed   By: Abelardo Diesel M.D.   On: 02/23/2013 02:28    Assessment/Plan: Extubate per CCM.  Rad Onc consult initiated with Dr. Lisbeth Renshaw.  Metastatic workup per Medicine Service.  Will need PT/Rehab.    LOS: 1 day    Peggyann Shoals, MD 02/23/2013, 8:10 AM

## 2013-02-23 NOTE — Progress Notes (Signed)
INITIAL NUTRITION ASSESSMENT  DOCUMENTATION CODES Per approved criteria  -Not Applicable   INTERVENTION: Encouraged intake at meals once diet advanced.   NUTRITION DIAGNOSIS: Unintentional weight loss related to hiatal hernia as evidenced by per pt report.   Goal: Pt to meet >/= 90% of their estimated nutrition needs   Monitor:  Diet advancement, PO intake, weight trend, labs   Reason for Assessment: Pt identified as at nutrition risk on the Malnutrition Screen Tool  73 y.o. 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 Pt is s/p thoracic laminectomy for tumor, pathology pending. Radiation oncology consult pending.   Per pt she was 132 lb 6 months ago at her regular Dr visit. Pt states that yesterday at her doctor's office she was 122 lb. Per pt for the last 3 months she has not been eating as much due to not being able to get food down. She states that there are many foods that she avoids due to this (milk, acidic foods) but she has no trouble chewing or swallowing and has a good appetite.  Pt was not willing to discuss supplements and stated that she would not drink them if I sent them. Difficult to determine exactly what and how much she was eating PTA.   Nutrition-focused physical exam WNL.   Height: Ht Readings from Last 1 Encounters:  02/22/13 5\' 2"  (1.575 m)    Weight: Wt Readings from Last 1 Encounters:  02/22/13 132 lb 4.4 oz (60 kg)    Ideal Body Weight: 50 kg  % Ideal Body Weight: 120%  Wt Readings from Last 10 Encounters:  02/22/13 132 lb 4.4 oz (60 kg)  02/22/13 132 lb 4.4 oz (60 kg)  04/19/12 136 lb 0.4 oz (61.7 kg)  04/19/12 136 lb 0.4 oz (61.7 kg)    Usual Body Weight: 132 lb   % Usual Body Weight: 100%  BMI:  Body mass index is 24.19 kg/(m^2).  Estimated Nutritional Needs: Kcal:  1500-1700 Protein: 65-75 grams Fluid: >1.5 L/day  Skin: back incision  Diet Order: NPO  EDUCATION NEEDS: -No education needs identified at this time   Intake/Output Summary (Last 24 hours) at 02/23/13 1146 Last data filed at 02/23/13 1000  Gross per 24 hour  Intake 2679.16 ml  Output   1895 ml  Net 784.16 ml    Last BM: PTA   Labs:   Recent Labs Lab 02/22/13 1126 02/22/13 2037  NA 133* 133*  K 4.3 3.5*  CL 92*  --   CO2 27  --   BUN 8  --   CREATININE 0.71  --   CALCIUM 11.6*  --   GLUCOSE 116*  --     CBG (last 3)  No results found for this basename: GLUCAP,  in the last 72 hours  Scheduled Meds: . antiseptic oral rinse  1 application Mouth Rinse QID  .  ceFAZolin (ANCEF) IV  1 g Intravenous Q8H  . chlorhexidine  15 mL Mouth/Throat BID  . pantoprazole (PROTONIX) IV  40 mg Intravenous QHS  . polyethylene glycol  17 g Oral Daily  . senna  1 tablet Oral BID  . sodium chloride  3 mL Intravenous Q12H    Continuous Infusions: . sodium chloride    . dextrose 5 % and 0.45 % NaCl with KCl 20 mEq/L 75 mL/hr at 02/23/13  7824  . niCARDipine Stopped (02/22/13 2322)  . propofol Stopped (02/23/13 0944)    Past Medical History  Diagnosis Date  . Hypertension     Past Surgical History  Procedure Laterality Date  . Abdominal hysterectomy    . Cataract extraction      Bilateral  . Cyst excision      Left Elbow, Arm and back  . Esophagogastroduodenoscopy N/A 04/20/2012    Procedure: ESOPHAGOGASTRODUODENOSCOPY (EGD);  Surgeon: Cleotis Nipper, MD;  Location: [facility] ENDOSCOPY;  Service: Endoscopy;  Laterality: N/A;  . Laminectomy N/A 02/22/2013    Procedure: THORACIC LAMINECTOMY FOR TUMOR;  Surgeon: Erline Levine, MD;  Location: [place] NEURO ORS;  Service: Neurosurgery;  Laterality: N/A;    Maylon Peppers RD, [place], [place] Pager [phone] After Hours Pager

## 2013-02-23 NOTE — Progress Notes (Signed)
Name: Ashley West MRN: [id number] DOB: 02-02-1940    ADMISSION DATE:  02/22/2013 CONSULTATION DATE:  02/23/2013  REFERRING MD :  Neurosurgery Vertell Limber PRIMARY SERVICE: Neurosurgery  CHIEF COMPLAINT:   Post op vent s/p thoracic spine decompression  BRIEF PATIENT DESCRIPTION:  73 year-old female status post thoracic spine compression for tumor. Patient left on mechanical ventilator overnight and critical care was consulted  SIGNIFICANT EVENTS / STUDIES:  2/12- back surgery, remained vented  LINES / TUBES: PIV Endotracheal tube 2/12>>>2/13  CULTURES: None  ANTIBIOTICS: Postop Ancef  SUBJECTIVE: awake, follows commands, weaning well  VITAL SIGNS: Temp:  [94.9 F (34.9 C)-98.5 F (36.9 C)] 98 F (36.7 C) (02/13 0810) Pulse Rate:  [59-96] 69 (02/13 0900) Resp:  [11-22] 17 (02/13 0900) BP: (114-175)/(52-89) 165/68 mmHg (02/13 0900) SpO2:  [98 %-100 %] 100 % (02/13 0900) Arterial Line BP: (148-238)/(51-87) 201/70 mmHg (02/13 0900) FiO2 (%):  [30 %-50 %] 30 % (02/13 0813) Weight:  [55.339 kg (122 lb)-60 kg (132 lb 4.4 oz)] 60 kg (132 lb 4.4 oz) (02/12 2300) HEMODYNAMICS:   VENTILATOR SETTINGS: Vent Mode:  [-] PSV;CPAP FiO2 (%):  [30 %-50 %] 30 % Set Rate:  [12 bmp-16 bmp] 12 bmp Vt Set:  [400 mL] 400 mL PEEP:  [5 cmH20] 5 cmH20 Pressure Support:  [5 cmH20] 5 cmH20 Plateau Pressure:  [14 cmH20-15 cmH20] 14 cmH20 INTAKE / OUTPUT: Intake/Output     02/12 0701 - 02/13 0700 02/13 0701 - 02/14 0700   I.V. (mL/kg) 2143.2 (35.7) 161 (2.7)   IV Piggyback 300    Total Intake(mL/kg) 2443.2 (40.7) 161 (2.7)   Urine (mL/kg/hr) 1360 60 (0.3)   Drains 25    Blood 400    Total Output 1785 60   Net +658.2 +101          PHYSICAL EXAMINATION: General:  Endotracheal tube in place in no distress Neuro:  Awake, Alert, cooperative HEENT:  Dry mucous membranes Cardiovascular:  Regular rate and rhythm normal S1-S2 no S3 or S4 no murmur rub heave or gallop Lungs:  Clear without  wheeze rale or rhonchi Abdomen:  Soft nontender bowel sounds hypoactive Musculoskeletal:  No edema, movement increased lowers from baseline per husband Skin:  Clear  LABS:  CBC  Recent Labs Lab 02/22/13 1126 02/22/13 2037  WBC 6.9  --   HGB 12.2 10.2*  HCT 36.1 30.0*  PLT 397  --    Coag's  Recent Labs Lab 02/22/13 1126 02/22/13 1331  APTT  --  36  INR 1.00 1.02   BMET  Recent Labs Lab 02/22/13 1126 02/22/13 2037  NA 133* 133*  K 4.3 3.5*  CL 92*  --   CO2 27  --   BUN 8  --   CREATININE 0.71  --   GLUCOSE 116*  --    Electrolytes  Recent Labs Lab 02/22/13 1126  CALCIUM 11.6*   Sepsis Markers No results found for this basename: LATICACIDVEN, PROCALCITON, O2SATVEN,  in the last 168 hours ABG  Recent Labs Lab 02/22/13 2037  PHART 7.438  PCO2ART 35.4  PO2ART 277.0*   Liver Enzymes No results found for this basename: AST, ALT, ALKPHOS, BILITOT, ALBUMIN,  in the last 168 hours Cardiac Enzymes No results found for this basename: TROPONINI, PROBNP,  in the last 168 hours Glucose No results found for this basename: GLUCAP,  in the last 168 hours  Imaging Mr Thoracic Spine W Wo Contrast  02/22/2013   CLINICAL DATA:  Progressive body numbness from the nipples down and an increasingly unsteady gait.  EXAM: MRI THORACIC SPINE WITHOUT AND WITH CONTRAST  TECHNIQUE: Multiplanar and multiecho pulse sequences of the thoracic spine were obtained without and with intravenous contrast.  CONTRAST:  10mL MULTIHANCE GADOBENATE DIMEGLUMINE 529 MG/ML IV SOLN  COMPARISON:  Chest x-ray dated 02/08/2013  FINDINGS: The patient has diffuse osseous metastases throughout the visualized portion of the spine. Tumor invades the spinal canal at T5, T6, and T7 and circumferentially compresses the thecal sac and spinal cord. There is no cortical destruction of the posterior margins of the vertebra at that level. Tumor does extend into the right paraspinal soft tissues at T6.  There are  no discrete lesions in the lungs. There is no visible hilar or mediastinal adenopathy and there is no visible tumor in the liver or spleen.  There is no pathologic fracture in the thoracic spine at this time. 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  After contrast administration there is patchy enhancement of the extensive tumor. There is enhancement of the extradural tumor compressing the spinal cord at the T5-6-7 level. The scout image demonstrates that there is tumor throughout the cervical spine as well.  IMPRESSION: 1. 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 Extradural tumor extends into the spinal canal at T5-6-7 and circumferentially compresses the spinal cord. The appearance is not specific but metastatic breast cancer should be strongly considered. 2. Critical Value/emergent results were called by telephone at the time of interpretation on 02/22/2013 at 4:25 PM to Dr. Calvert Cantor , who verbally acknowledged these results.   Electronically Signed   By: Rozetta Nunnery M.D.   On: 02/22/2013 16:33   Mr Lumbar Spine W Wo Contrast  02/22/2013   CLINICAL DATA:  Increasing numbness from the nipples down over the last few weeks. Increasingly unsteady gait.  EXAM: MRI LUMBAR SPINE WITHOUT AND WITH CONTRAST  TECHNIQUE: Multiplanar and multiecho pulse sequences of the lumbar spine were obtained without and with intravenous contrast.  CONTRAST:  74mL MULTIHANCE GADOBENATE DIMEGLUMINE 529 MG/ML IV SOLN  COMPARISON:  None.  FINDINGS: Tip of the conus is at L2. The distal spinal cord appears normal. The paraspinal soft tissues are normal. There is no adenopathy or visible abdominal mass lesion. The patient does have extensive diverticulosis of the distal colon.  The patient has patchy tumor throughout the visualized portion of the spine including the sacrum. There is no pathologic fracture. The most extensive tumors and T12 and S1. The discs throughout the lumbar spine are essentially  normal. There is moderate right facet arthritis at L3-4 and L4-5 and L5-S1.  There is no visible tumor extension into the spinal canal.  After contrast administration there is enhancement around the arthritic facet joints in the lower lumbar spine. The tumor does not enhance on these images.  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 This is of unknown origin. Given the lack of adenopathy and with no visible lesions in the liver or kidneys and no ascites, I suspect this may represent breast cancer although I have seen this appearance with undifferentiated adenocarcinoma of unknown origin.   Electronically Signed   By: Rozetta Nunnery M.D.   On: 02/22/2013 16:42   Dg Thoracic Spine 1 View  02/22/2013   CLINICAL DATA:  73 year old female with spinal metastatic disease with cord compression in the thoracic spine. Initial encounter.  EXAM: OPERATIVE THORACIC SPINE 1 VIEW(S)  COMPARISON:  Thoracic MRI 02/22/2013.  FINDINGS: Portable  intraoperative prone frontal view of the thoracic spine at 1950 hrs.  Some thoracic spine anatomy obscured by overlying retractor hardware. Judging from sagittal views on the comparison there are full size ribs at T12, and no ribs at L1. Based on the lowest visible ribs on this intraoperative image, the cephalad most surgical needle/probe is at the T7-T8 level, the middle localizer is at T10-T11, an the most caudal localizer is at T12-L1. This seems to agree with numbering from the thoracic inlet where the first ribs are visible.  Endotracheal tube tip above the carina. Enteric tube coursing in the midline to the T11 level, tip likely still within the distal thoracic esophagus.  IMPRESSION: 1. Intraoperative localization as above; most cephalad horizontal surgical marker at T7-T8. 2. Enteric tube tip likely in the distal thoracic esophagus, could be advanced for more optimal placement.   Electronically Signed   By: Lars Pinks M.D.   On: 02/22/2013 20:10   Dg Chest Port 1  View  02/23/2013   CLINICAL DATA:  Intubated  EXAM: PORTABLE CHEST - 1 VIEW  COMPARISON:  February 08, 2013  FINDINGS: The heart size and mediastinal contours are within normal limits. There is no focal infiltrate, pulmonary edema, or pleural effusion. Endotracheal tube is identified distal tip 3.4 cm from carina. There is no pneumothorax. The previously noted calcified granuloma is not as well appreciated on this exam. The visualized skeletal structures are stable.  IMPRESSION: Endotracheal tube distal tip 3.4 cm from carina. There is no pneumothorax. No acute cardiopulmonary disease is identified.   Electronically Signed   By: Abelardo Diesel M.D.   On: 02/23/2013 02:28     CXR: Chest x-ray is reviewed and shows no acute process an endotracheal tube in adequate position  ASSESSMENT / PLAN:  PULMONARY A: Expected need for mechanical ventilation after prolonged thoracic spine surgery and anesthesia P:   Currently doing well on SBT and will plan to extubate this morning.  IS Upright We assessed her rsbi Dc sedation  CARDIOVASCULAR A: Hypertension poorly controlled, mild brady P:  Nicardipine drip not required, avoid if able PRN Hydralazine added Goal systolic pressure less than 150 and will follow noninvasive Dc a line asap  RENAL A:  No active process P:   Monitor chem in am   GASTROINTESTINAL A: gastric protection P:   Monitor ppi while on vent  HEMATOLOGIC A:  Anemia, dvt prevention P:  Trend CBC scd Follow cbc further with fluids and dilution risk Limit volume  INFECTIOUS A:  No active process P:   Monitor  ENDOCRINE A:   No active process   P:   Mod  NEUROLOGIC A:  Status post thoracic spine decompression with thoracic spinal tumor P:   Per neurosurgery  TODAY'S SUMMARY:  Wean to extubate this morning if able.  Will need continued BP management.  I have personally obtained a history, examined the patient, evaluated laboratory and imaging results,  formulated the assessment and plan and placed orders. CRITICAL CARE: The patient is critically ill with multiple organ systems failure and requires high complexity decision making for assessment and support, frequent evaluation and titration of therapies, application of advanced monitoring technologies and extensive interpretation of multiple databases. Critical Care Time devoted to patient care services described in this note is 30 minutes.  Husband updated Lavon Paganini. Titus Mould, MD, FACP Pgr: [facility] [place], ACNP [facility] Pulmonology/Critical Care Pager [phone] or [phone]  02/23/2013, 10:07 AM

## 2013-02-23 NOTE — Evaluation (Signed)
Physical Therapy Evaluation Patient Details Name: Ashley West MRN: [id number] DOB: [birth year] Today's Date: 02/23/2013 Time: 2585-2778 PT Time Calculation (min): 28 min  PT Assessment / Plan / Recommendation History of Present Illness  Pt is a 73 y.o. female brought in secondary to increased amount of falls and numbness around buttocks and umbilicus region. 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 Pt s/p THORACIC LAMINECTOMY FOR TUMOR (N/A) T 5 - T 8  Clinical Impression  Patient is s/p thoracic laminectomy for tumor T5-T8 surgery resulting in functional limitations due to the deficits listed below (see PT Problem List).  Patient will benefit from skilled PT to increase their independence and safety with mobility to allow discharge to the venue listed below. Pt demo decreased coordination and weakness in bil LEs with mobility. Pt very unsteady and required 2+ (A) for safe SPT today to prevent LEs buckling and facilitate pivotal steps. Pt is very motivated and prior to admission was Independent with all mobility and ADLs. Pt to be a great candidate for CIR.     PT Assessment  Patient needs continued PT services    Follow Up Recommendations  CIR    Does the patient have the potential to tolerate intense rehabilitation      Barriers to Discharge Decreased caregiver support      Equipment Recommendations  Other (comment) (TBD)    Recommendations for Other Services Rehab consult   Frequency Min 4X/week    Precautions / Restrictions Precautions Precautions: Fall;Back Precaution Comments: educated on back precautions throughout session Restrictions Weight Bearing Restrictions: No   Pertinent Vitals/Pain Stable t/o session.       Mobility  Bed Mobility Overal bed mobility: Needs Assistance Bed Mobility: Rolling;Sidelying to Sit Rolling: Min assist Sidelying to sit: Mod assist General bed mobility comments: cues for log rolling technique; (A) to bring LEs off  EOB and to elevate trunk Transfers Overall transfer level: Needs assistance Equipment used: 2 person hand held assist Transfers: Sit to/from [facility] Sit to Stand: +2 physical assistance;Mod assist Stand pivot transfers: +2 physical assistance;Mod assist General transfer comment: required bil LEs to be blocked to prevent buckling; pt demo decreased ability to control LE movement; requires 2 person (A) to facilitate pivotal steps to chair; pt fearful of falling; max directional cues throughout transfer          PT Diagnosis: Difficulty walking;Generalized weakness;Acute pain  PT Problem List: Decreased strength;Decreased activity tolerance;Decreased balance;Decreased mobility;Decreased safety awareness;Pain;Decreased knowledge of precautions;Decreased coordination PT Treatment Interventions: DME instruction;Gait training;Functional mobility training;Therapeutic activities;Balance training;Therapeutic exercise;Neuromuscular re-education;Patient/family education;Wheelchair mobility training     PT Goals(Current goals can be found in the care plan section) Acute Rehab PT Goals Patient Stated Goal: to get out of this bed and get stronger PT Goal Formulation: With patient Time For Goal Achievement: 03/09/13 Potential to Achieve Goals: Good  Visit Information  Last PT Received On: 02/23/13 Assistance Needed: +2 PT/OT/SLP Co-Evaluation/Treatment: Yes Reason for Co-Treatment: Complexity of the patient's impairments (multi-system involvement) PT goals addressed during session: Mobility/safety with mobility;Balance OT goals addressed during session: ADL's and self-care History of Present Illness: Pt is a 73 y.o. female brought in secondary to increased amount of falls and numbness around buttocks and umbilicus region. 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 Pt s/p THORACIC LAMINECTOMY FOR TUMOR (N/A) T 5 - T 8       Prior [place] expects to be discharged to:: Private residence Living Arrangements: Spouse/significant other Available Help  at Discharge: Family;Available 24 hours/day Type of Home: House Home Access: Stairs to enter [employer] of Steps: 1 Home Layout: One level Home Equipment: Cane - single point Prior Function Level of Independence: Independent Comments: PTA pt was ambulating without AD and was independent with ADLs Communication Communication: No difficulties Dominant Hand: Right    Cognition  Cognition Arousal/Alertness: Awake/alert Behavior During Therapy: WFL for tasks assessed/performed Overall Cognitive Status: Impaired/Different from baseline Area of Impairment: Safety/judgement;Memory;Problem solving Memory: Decreased short-term memory Safety/Judgement: Decreased awareness of deficits;Decreased awareness of safety Problem Solving: Difficulty sequencing;Requires verbal cues;Requires tactile cues General Comments: no family present     Extremity/Trunk Assessment Upper Extremity Assessment Upper Extremity Assessment: Defer to OT evaluation Lower Extremity Assessment Lower Extremity Assessment: LLE deficits/detail;RLE deficits/detail RLE Deficits / Details: grossly 3+/5 in hips; 4/5 in knee RLE Sensation: decreased light touch RLE Coordination: decreased gross motor LLE Deficits / Details: grossly 3+/5 in hips; 4-/5 in knee LLE Sensation: decreased light touch LLE Coordination: decreased gross motor Cervical / Trunk Assessment Cervical / Trunk Assessment: Normal   Balance Balance Overall balance assessment: Needs assistance;History of Falls Sitting-balance support: Feet supported;Single extremity supported Sitting balance-Leahy Scale: Fair Standing balance support: During functional activity;Bilateral upper extremity supported Standing balance-Leahy Scale: Zero Standing balance comment: requires 2+(A) to maintain balance   End of Session PT - End  of Session Equipment Utilized During Treatment: Gait belt Activity Tolerance: Patient tolerated treatment well Patient left: in chair;with call bell/phone within reach Nurse Communication: Mobility status;Precautions  GP     Gustavus Bryant , [place]  02/23/2013, 5:09 PM

## 2013-02-23 NOTE — Progress Notes (Signed)
Pt assisted back to bed after ~ 2 hours up in chair.  Easy 2 assist w/ gait belt; pt's leg strength & steadiness improved from earlier today.

## 2013-02-23 NOTE — Progress Notes (Signed)
Occupational Therapy Evaluation Patient Details Name: Ashley West MRN: [id number] DOB: 28-Apr-1940 Today's Date: 02/23/2013 Time: 5176-1607 OT Time Calculation (min): 33 min  OT Assessment / Plan / Recommendation History of present illness Pt is a 73 y.o. female brought in secondary to increased amount of falls and numbness around buttocks and umbilicus region. 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 Pt s/p THORACIC LAMINECTOMY FOR TUMOR (N/A) T 5 - T 8   Clinical Impression   PTA, pt lived with husband and was independent with ADL and mobility. Pt presents with significant functional decline and is an excellent CIR candidate. Pt has very supportive husband who can provide 24/7 S after D/C. Pt appears to not understand why she is having difficulty with her legs and states it is because she has been in bed. Pt will benefit from skilled OT services to facilitate D/C to next venue due to below deficits.    OT Assessment  Patient needs continued OT Services    Follow Up Recommendations  CIR;Supervision/Assistance - 24 hour    Barriers to Discharge      Equipment Recommendations  3 in 1 bedside comode;Tub/shower bench    Recommendations for Other Services Rehab consult  Frequency  Min 3X/week    Precautions / Restrictions Precautions Precautions: Fall;Back Precaution Comments: educated on back precautions throughout session Restrictions Weight Bearing Restrictions: No   Pertinent Vitals/Pain no apparent distress     ADL  Grooming: Supervision/safety;Set up Where Assessed - Grooming: Supported sitting Upper Body Bathing: Set up;Supervision/safety Where Assessed - Upper Body Bathing: Supported sitting Lower Body Bathing: Maximal assistance Where Assessed - Lower Body Bathing: Supported sit to stand Upper Body Dressing: Set up;Supervision/safety Where Assessed - Upper Body Dressing: Supported sitting Lower Body Dressing: Maximal assistance Where Assessed -  Lower Body Dressing: Supported sit to Lobbyist: +2 Total assistance;Maximal assistance Toilet Transfer Method: Stand pivot Toileting - Clothing Manipulation and Hygiene: Maximal assistance Equipment Used: Gait belt Transfers/Ambulation Related to ADLs: +2 total A. ataxic gait pattern    OT Diagnosis: Generalized weakness;Acute pain  OT Problem List: Decreased strength;Decreased range of motion;Decreased activity tolerance;Impaired balance (sitting and/or standing);Decreased coordination;Decreased safety awareness;Decreased knowledge of use of DME or AE;Decreased knowledge of precautions;Impaired sensation;Impaired tone;Pain;Increased edema OT Treatment Interventions: Self-care/ADL training;Therapeutic exercise;Energy conservation;DME and/or AE instruction;Therapeutic activities;Balance training;Patient/family education   OT Goals(Current goals can be found in the care plan section) Acute Rehab OT Goals Patient Stated Goal: to get out of this bed and get stronger OT Goal Formulation: With patient Time For Goal Achievement: 03/09/13 Potential to Achieve Goals: Good  Visit Information  Last OT Received On: 02/23/13 Assistance Needed: +2 PT/OT/SLP Co-Evaluation/Treatment: Yes Reason for Co-Treatment: Complexity of the patient's impairments (multi-system involvement) PT goals addressed during session: Mobility/safety with mobility;Balance OT goals addressed during session: ADL's and self-care History of Present Illness: Pt is a 73 y.o. female brought in secondary to increased amount of falls and numbness around buttocks and umbilicus region. 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 Pt s/p THORACIC LAMINECTOMY FOR TUMOR (N/A) T 5 - T 8       Prior [place] expects to be discharged to:: Private residence Living Arrangements: Spouse/significant other Available Help at Discharge: Family;Available 24 hours/day Type of Home:  House Home Access: Stairs to enter [employer] of Steps: 1 Home Layout: One level Home Equipment: Cane - single point Prior Function Level of Independence: Independent Comments: PTA pt was ambulating without AD and  was independent with ADLs Communication Communication: No difficulties Dominant Hand: Right         Vision/Perception Vision - History Baseline Vision: Wears glasses all the time   Cognition  Cognition Arousal/Alertness: Awake/alert Behavior During Therapy: WFL for tasks assessed/performed Overall Cognitive Status: Impaired/Different from baseline Area of Impairment: Safety/judgement;Memory;Problem solving Memory: Decreased short-term memory Safety/Judgement: Decreased awareness of deficits;Decreased awareness of safety Awareness: Emergent Problem Solving: Difficulty sequencing;Requires verbal cues;Requires tactile cues General Comments: no family present     Extremity/Trunk Assessment Upper Extremity Assessment Upper Extremity Assessment: Defer to OT evaluation Lower Extremity Assessment Lower Extremity Assessment: LLE deficits/detail;RLE deficits/detail RLE Deficits / Details: grossly 3+/5 in hips; 4/5 in knee RLE Sensation: decreased light touch RLE Coordination: decreased gross motor LLE Deficits / Details: grossly 3+/5 in hips; 4-/5 in knee LLE Sensation: decreased light touch LLE Coordination: decreased gross motor Cervical / Trunk Assessment Cervical / Trunk Assessment: Normal Cervical / Trunk Exceptions: post bias in sitting. weak core in standing     Mobility Bed Mobility Overal bed mobility: Needs Assistance Bed Mobility: Rolling;Sidelying to Sit Rolling: Min assist Sidelying to sit: Mod assist General bed mobility comments: cues for log rolling technique; (A) to bring LEs off EOB and to elevate trunk Transfers Overall transfer level: Needs assistance Equipment used: 2 person hand held assist Transfers: Sit to/from [employer] Sit to Stand: +2 physical assistance;Mod assist Stand pivot transfers: +2 physical assistance;Mod assist General transfer comment: required bil LEs to be blocked to prevent buckling; pt demo decreased ability to control LE movement; requires 2 person (A) to facilitate pivotal steps to chair; pt fearful of falling; max directional cues throughout transfer      Exercise     Balance Balance Overall balance assessment: Needs assistance;History of Falls Sitting-balance support: Feet supported;Single extremity supported Sitting balance-Leahy Scale: Fair Sitting balance - Comments: post bias Postural control: Posterior lean Standing balance support: During functional activity;Bilateral upper extremity supported Standing balance-Leahy Scale: Zero Standing balance comment: requires 2+(A) to maintain balance    End of Session OT - End of Session Equipment Utilized During Treatment: Gait belt Activity Tolerance: Patient tolerated treatment well Patient left: in chair;with call bell/phone within reach Nurse Communication: Mobility status  GO     Deaire Mcwhirter,[place] 02/23/2013, 6:03 PM [facility], OTR/L  [phone] 02/23/2013

## 2013-02-23 NOTE — Procedures (Signed)
Extubation Procedure Note  Patient Details:   Name: MATILYNN DACEY DOB: 1940-06-10 MRN: [id number]   Airway Documentation:  Airway 7.5 mm (Active)  Secured at (cm) 23 cm 02/23/2013  8:13 AM  Measured From Lips 02/23/2013  8:13 AM  Secured Location Right 02/23/2013  8:13 AM  Secured By [employer] 02/23/2013  8:13 AM  Tube Holder Repositioned Yes 02/23/2013  8:13 AM  Cuff Pressure (cm H2O) 24 cm H2O 02/23/2013  3:32 AM  Site Condition Dry 02/23/2013  8:13 AM    Evaluation  O2 sats: stable throughout Complications: No apparent complications Patient did tolerate procedure well. Bilateral Breath Sounds: Clear   Yes  Pt extubated to 2 LPM nasal cannula. Pt was able to vocalize name and had a strong productive cough, great effort. Pt breath sounds were clear and good air movement in neck. No stridor noted. Pt vitals are stable. RT will continue to monitor pt.   Nikesha Kwasny M 02/23/2013, 10:32 AM

## 2013-02-24 ENCOUNTER — Inpatient Hospital Stay (HOSPITAL_COMMUNITY): Payer: Medicare HMO

## 2013-02-24 DIAGNOSIS — J9600 Acute respiratory failure, unspecified whether with hypoxia or hypercapnia: Secondary | ICD-10-CM

## 2013-02-24 DIAGNOSIS — D649 Anemia, unspecified: Secondary | ICD-10-CM

## 2013-02-24 LAB — BASIC METABOLIC PANEL
BUN: 7 mg/dL (ref 6–23)
CO2: 24 meq/L (ref 19–32)
Calcium: 9.8 mg/dL (ref 8.4–10.5)
Chloride: 105 mEq/L (ref 96–112)
Creatinine, Ser: 0.74 mg/dL (ref 0.50–1.10)
GFR calc Af Amer: >90 mL/min (ref >90)
GFR, EST NON AFRICAN AMERICAN: 84 mL/min — AB (ref >90)
Glucose, Bld: 124 mg/dL — ABNORMAL HIGH (ref 70–99)
Potassium: 4.3 mEq/L (ref 3.7–5.3)
Sodium: 138 mEq/L (ref 137–147)

## 2013-02-24 LAB — CBC WITH DIFFERENTIAL/PLATELET
Basophils Absolute: 0 10*3/uL (ref 0.0–0.1)
Basophils Relative: 0 % (ref 0–1)
Eosinophils Absolute: 0 10*3/uL (ref 0.0–0.7)
Eosinophils Relative: 0 % (ref 0–5)
HCT: 27.5 % — ABNORMAL LOW (ref 36.0–46.0)
Hemoglobin: 9.1 g/dL — ABNORMAL LOW (ref 12.0–15.0)
LYMPHS ABS: 1.1 10*3/uL (ref 0.7–4.0)
LYMPHS PCT: 9 % — AB (ref 12–46)
MCH: 27.7 pg (ref 26.0–34.0)
MCHC: 33.1 g/dL (ref 30.0–36.0)
MCV: 83.8 fL (ref 78.0–100.0)
Monocytes Absolute: 0.8 10*3/uL (ref 0.1–1.0)
Monocytes Relative: 7 % (ref 3–12)
NEUTROS PCT: 84 % — AB (ref 43–77)
Neutro Abs: 10.2 10*3/uL — ABNORMAL HIGH (ref 1.7–7.7)
Platelets: 366 10*3/uL (ref 150–400)
RBC: 3.28 MIL/uL — AB (ref 3.87–5.11)
RDW: 15.3 % (ref 11.5–15.5)
WBC: 12.1 10*3/uL — AB (ref 4.0–10.5)

## 2013-02-24 MED ORDER — PANTOPRAZOLE SODIUM 40 MG PO TBEC
40.0000 mg | DELAYED_RELEASE_TABLET | Freq: Every day | ORAL | Status: DC
  Administered 2013-02-24 – 2013-03-02 (×7): 40 mg via ORAL
  Filled 2013-02-24 (×6): qty 1

## 2013-02-24 MED ORDER — PANTOPRAZOLE SODIUM 40 MG PO PACK: 40.0000 mg | PACK | Freq: Every day | ORAL | Status: DC

## 2013-02-24 NOTE — Progress Notes (Signed)
PULMONARY / CRITICAL CARE MEDICINE  Name: Ashley West MRN: [id number] DOB: 05/31/1940    ADMISSION DATE:  02/22/2013 CONSULTATION DATE:  02/24/2013  REFERRING MD :  Neurosurgery PRIMARY SERVICE: Neurosurgery  CHIEF COMPLAINT:  Post op vent s/p thoracic spine decompression  BRIEF PATIENT DESCRIPTION: 73 year-old female status post thoracic spine compression for tumor. Patient left on mechanical ventilator overnight and critical care was consulted  SIGNIFICANT EVENTS / STUDIES:   LINES / TUBES: OETT 2/12 >>> 2/13  CULTURES:  ANTIBIOTICS:  INTERVAL HISTORY:  No acute overnight events  VITAL SIGNS: Temp:  [98.1 F (36.7 C)-99.3 F (37.4 C)] 98.1 F (36.7 C) (02/14 0800) Pulse Rate:  [64-95] 79 (02/14 0900) Resp:  [12-26] 18 (02/14 1000) BP: (101-155)/(43-95) 128/60 mmHg (02/14 1000) SpO2:  [87 %-100 %] 100 % (02/14 0900)  HEMODYNAMICS:   VENTILATOR SETTINGS:   INTAKE / OUTPUT: Intake/Output     02/13 0701 - 02/14 0700 02/14 0701 - 02/15 0700   P.O. 387 320   I.V. (mL/kg) 1811 (30.2) 225 (3.8)   IV Piggyback 50    Total Intake(mL/kg) 2248 (37.5) 545 (9.1)   Urine (mL/kg/hr) 255 (0.2) 325 (1.4)   Drains 25 (0)    Blood     Total Output 280 325   Net +1968 +220        Urine Occurrence 8 x 1 x     PHYSICAL EXAMINATION: General:  NAD  Neuro:  Awake, Alert, cooperative HEENT:  Dry mucous membranes Cardiovascular:  Regular rate and rhythm normal S1-S2 no S3 or S4 no murmur rub heave or gallop Lungs:  Clear without wheeze rale or rhonchi Abdomen:  Soft nontender bowel sounds hypoactive Musculoskeletal:  No edema, movement increased LE  Skin:  Clear  LABS:  CBC  Recent Labs Lab 02/22/13 1126 02/22/13 2037 02/24/13 0255  WBC 6.9  --  12.1*  HGB 12.2 10.2* 9.1*  HCT 36.1 30.0* 27.5*  PLT 397  --  366   Coag's  Recent Labs Lab 02/22/13 1126 02/22/13 1331  APTT  --  36  INR 1.00 1.02   BMET  Recent Labs Lab 02/22/13 1126 02/22/13 2037  02/24/13 0255  NA 133* 133* 138  K 4.3 3.5* 4.3  CL 92*  --  105  CO2 27  --  24  BUN 8  --  7  CREATININE 0.71  --  0.74  GLUCOSE 116*  --  124*   Electrolytes  Recent Labs Lab 02/22/13 1126 02/24/13 0255  CALCIUM 11.6* 9.8   Sepsis Markers No results found for this basename: LATICACIDVEN, PROCALCITON, O2SATVEN,  in the last 168 hours ABG  Recent Labs Lab 02/22/13 2037  PHART 7.438  PCO2ART 35.4  PO2ART 277.0*   Liver Enzymes No results found for this basename: AST, ALT, ALKPHOS, BILITOT, ALBUMIN,  in the last 168 hours Cardiac Enzymes No results found for this basename: TROPONINI, PROBNP,  in the last 168 hours Glucose No results found for this basename: GLUCAP,  in the last 168 hours  Imaging Mr Thoracic Spine W Wo Contrast  02/22/2013   CLINICAL DATA:  Progressive body numbness from the nipples down and an increasingly unsteady gait.  EXAM: MRI THORACIC SPINE WITHOUT AND WITH CONTRAST  TECHNIQUE: Multiplanar and multiecho pulse sequences of the thoracic spine were obtained without and with intravenous contrast.  CONTRAST:  64mL MULTIHANCE GADOBENATE DIMEGLUMINE 529 MG/ML IV SOLN  COMPARISON:  Chest x-ray dated 02/08/2013  FINDINGS: The patient has diffuse osseous  metastases throughout the visualized portion of the spine. Tumor invades the spinal canal at T5, T6, and T7 and circumferentially compresses the thecal sac and spinal cord. There is no cortical destruction of the posterior margins of the vertebra at that level. Tumor does extend into the right paraspinal soft tissues at T6.  There are no discrete lesions in the lungs. There is no visible hilar or mediastinal adenopathy and there is no visible tumor in the liver or spleen.  There is no pathologic fracture in the thoracic spine at this time. 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  After contrast administration there is patchy enhancement of the extensive tumor. There is enhancement of the extradural tumor compressing the  spinal cord at the T5-6-7 level. The scout image demonstrates that there is tumor throughout the cervical spine as well.  IMPRESSION: 1. 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 Extradural tumor extends into the spinal canal at T5-6-7 and circumferentially compresses the spinal cord. The appearance is not specific but metastatic breast cancer should be strongly considered. 2. Critical Value/emergent results were called by telephone at the time of interpretation on 02/22/2013 at 4:25 PM to Dr. Calvert Cantor , who verbally acknowledged these results.   Electronically Signed   By: Rozetta Nunnery M.D.   On: 02/22/2013 16:33   Mr Lumbar Spine W Wo Contrast  02/22/2013   CLINICAL DATA:  Increasing numbness from the nipples down over the last few weeks. Increasingly unsteady gait.  EXAM: MRI LUMBAR SPINE WITHOUT AND WITH CONTRAST  TECHNIQUE: Multiplanar and multiecho pulse sequences of the lumbar spine were obtained without and with intravenous contrast.  CONTRAST:  1mL MULTIHANCE GADOBENATE DIMEGLUMINE 529 MG/ML IV SOLN  COMPARISON:  None.  FINDINGS: Tip of the conus is at L2. The distal spinal cord appears normal. The paraspinal soft tissues are normal. There is no adenopathy or visible abdominal mass lesion. The patient does have extensive diverticulosis of the distal colon.  The patient has patchy tumor throughout the visualized portion of the spine including the sacrum. There is no pathologic fracture. The most extensive tumors and T12 and S1. The discs throughout the lumbar spine are essentially normal. There is moderate right facet arthritis at L3-4 and L4-5 and L5-S1.  There is no visible tumor extension into the spinal canal.  After contrast administration there is enhancement around the arthritic facet joints in the lower lumbar spine. The tumor does not enhance on these images.  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 This is of unknown origin. Given the lack of  adenopathy and with no visible lesions in the liver or kidneys and no ascites, I suspect this may represent breast cancer although I have seen this appearance with undifferentiated adenocarcinoma of unknown origin.   Electronically Signed   By: Rozetta Nunnery M.D.   On: 02/22/2013 16:42   Dg Thoracic Spine 1 View  02/22/2013   CLINICAL DATA:  73 year old female with spinal metastatic disease with cord compression in the thoracic spine. Initial encounter.  EXAM: OPERATIVE THORACIC SPINE 1 VIEW(S)  COMPARISON:  Thoracic MRI 02/22/2013.  FINDINGS: Portable intraoperative prone frontal view of the thoracic spine at 1950 hrs.  Some thoracic spine anatomy obscured by overlying retractor hardware. Judging from sagittal views on the comparison there are full size ribs at T12, and no ribs at L1. Based on the lowest visible ribs on this intraoperative image, the cephalad most surgical needle/probe is at the T7-T8 level, the middle localizer is  at T10-T11, an the most caudal localizer is at T12-L1. This seems to agree with numbering from the thoracic inlet where the first ribs are visible.  Endotracheal tube tip above the carina. Enteric tube coursing in the midline to the T11 level, tip likely still within the distal thoracic esophagus.  IMPRESSION: 1. Intraoperative localization as above; most cephalad horizontal surgical marker at T7-T8. 2. Enteric tube tip likely in the distal thoracic esophagus, could be advanced for more optimal placement.   Electronically Signed   By: Lars Pinks M.D.   On: 02/22/2013 20:10   Dg Chest Port 1 View  02/24/2013   CLINICAL DATA:  Evaluate for atelectasis.  EXAM: PORTABLE CHEST - 1 VIEW  COMPARISON:  Chest x-ray 02/22/2013.  FINDINGS: Patient has been extubated. Lung volumes are low. No consolidative airspace disease. No significant atelectasis. No pleural effusions. No evidence of pulmonary edema. Heart size is upper limits of normal. Upper mediastinal contours are within normal limits.  Skin fold artifact over the lower left hemithorax incidentally noted.  IMPRESSION: 1. Status post extubation. 2. Low lung volumes without radiographic evidence of acute cardiopulmonary disease.   Electronically Signed   By: Vinnie Langton M.D.   On: 02/24/2013 09:56   Dg Chest Port 1 View  02/23/2013   CLINICAL DATA:  Intubated  EXAM: PORTABLE CHEST - 1 VIEW  COMPARISON:  February 08, 2013  FINDINGS: The heart size and mediastinal contours are within normal limits. There is no focal infiltrate, pulmonary edema, or pleural effusion. Endotracheal tube is identified distal tip 3.4 cm from carina. There is no pneumothorax. The previously noted calcified granuloma is not as well appreciated on this exam. The visualized skeletal structures are stable.  IMPRESSION: Endotracheal tube distal tip 3.4 cm from carina. There is no pneumothorax. No acute cardiopulmonary disease is identified.   Electronically Signed   By: Abelardo Diesel M.D.   On: 02/23/2013 02:28   CXR:  Low lung volumes without radiographic evidence of acute cardiopulmonary disease  ASSESSMENT / PLAN:  PULMONARY A:  Acute respiratory failure, resolved P:   Goal SpO2>92 Supplemental oxygen PRN IS   CARDIOVASCULAR A:  Hypertension P:  Goal BP < 140/90 D/c Nicardipine drip Hydralazine PRN  RENAL A:   No acute issues  P:   Trend BMP  GASTROINTESTINAL A:  Nutrition GERD P:   Protonix Diet  HEMATOLOGIC A:   Anemia VTE Px P:  Trend CBC SCD  INFECTIOUS A:   No active issues  P:   Monitor  ENDOCRINE A:   No active issues  P:   No intervention required  NEUROLOGIC A:   Status post thoracic spine decompression with thoracic spinal tumor P:   Per Neurosurgery  PCCM will sign off. Please re consult if necessary.  I have personally obtained history, examined patient, evaluated and interpreted laboratory and imaging results, reviewed medical records, formulated assessment / plan and placed  orders.  Doree Fudge, MD Pulmonary and [place] Pager: [phone]  02/24/2013, 1:24 PM

## 2013-02-24 NOTE — Progress Notes (Signed)
Patient ID: Ashley West, female   DOB: 03-07-1940, 73 y.o.   MRN: [id number] Afeb, vss Neuro much better. Her lower extremity strength is markedly improved. Wound clean and dry. Will remove drain today. Increasing activity with therapy. Will plan to transfer from unit tomorrow.

## 2013-02-24 NOTE — Progress Notes (Signed)
Prior to hemovac d/c, RN heard drain lose suction, drain had disconnected from tubing connection. Free tip cleansed with alcohol and drain was removed. Minimal oozing at site, held pressure and new gauze dressing applied. DD&I. Will continue to monitor.   Latrelle Dodrill

## 2013-02-24 NOTE — Progress Notes (Signed)
Physical Therapy Treatment Patient Details Name: Ashley West MRN: [id number] DOB: May 26, 1940 Today's Date: 02/24/2013 Time: 1740-8144 PT Time Calculation (min): 25 min  PT Assessment / Plan / Recommendation  History of Present Illness Pt is a 73 y.o. female brought in secondary to increased amount of falls and numbness around buttocks and umbilicus region. 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 Pt s/p THORACIC LAMINECTOMY FOR TUMOR (N/A) T 5 - T 8   PT Comments   Pt progressing with ambulation but continues to have gt abnormalities and decreased coordination with LEs. Pt had LOB x2 during ambulation and requires (A) to maintain balance. Continues to be a good candidate for CIR. Pt was Independent PTA. Will cont to follow per POC.   Follow Up Recommendations  CIR     Does the patient have the potential to tolerate intense rehabilitation     Barriers to Discharge        Equipment Recommendations  Other (comment)    Recommendations for Other Services Rehab consult  Frequency Min 4X/week   Progress towards PT Goals Progress towards PT goals: Progressing toward goals  Plan Current plan remains appropriate    Precautions / Restrictions Precautions Precautions: Fall;Back Precaution Comments: reviewed back precautions; pt unable to recall back precautions independently Restrictions Weight Bearing Restrictions: No   Pertinent Vitals/Pain Stable t/o session. 6/10 pain at surgical site; denied any pain or numbness in LEs today.     Mobility  Bed Mobility Overal bed mobility: Needs Assistance Bed Mobility: Rolling;Sidelying to Sit Rolling: Min assist Sidelying to sit: Mod assist General bed mobility comments: (A) to perform log rolling technique; (A) to elevate trunk to sitting position at EOB; requires incr time due to pain Transfers Overall transfer level: Needs assistance Equipment used: Rolling walker (2 wheeled) Transfers: Sit to/from Stand Sit to Stand:  +2 safety/equipment;Min assist General transfer comment: cues for hand placement and sequencing  Ambulation/Gait Ambulation/Gait assistance: Mod assist Ambulation Distance (Feet): 16 Feet Assistive device: Rolling walker (2 wheeled) Gait Pattern/deviations: Decreased dorsiflexion - left;Decreased dorsiflexion - right;Decreased stride length;Step-through pattern;Wide base of support (Lt genu recurvatum ) Gait velocity: very decreased Gait velocity interpretation: <1.8 ft/sec, indicative of risk for recurrent falls General Gait Details: pt with increased hyperextension in Lt LE with ambulating; short shuffled gt and (A) to maintain balance; pt leaning posterioly at times and requires (A) to maintain balance and manage RW; pt fatigued quickly; cues for gt sequencing     Exercises General Exercises - Lower Extremity Ankle Circles/Pumps: AROM;Both;10 reps;Seated Long Arc Quad: AROM;Both;10 reps;Seated Hip Flexion/Marching: AROM;Both;10 reps;Seated   PT Diagnosis:    PT Problem List:   PT Treatment Interventions:     PT Goals (current goals can now be found in the care plan section) Acute Rehab PT Goals Patient Stated Goal: to get out of this bed and get stronger PT Goal Formulation: With patient Time For Goal Achievement: 03/09/13 Potential to Achieve Goals: Good  Visit Information  Last PT Received On: 02/24/13 Assistance Needed: +2 History of Present Illness: Pt is a 73 y.o. female brought in secondary to increased amount of falls and numbness around buttocks and umbilicus region. 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 Pt s/p THORACIC LAMINECTOMY FOR TUMOR (N/A) T 5 - T 8    Subjective Data  Subjective: pt lying supine; " i need to walk"  Patient Stated Goal: to get out of this bed and get stronger   Cognition  Cognition Arousal/Alertness: Awake/alert Behavior During Therapy:  WFL for tasks assessed/performed Overall Cognitive Status: Impaired/Different from  baseline Area of Impairment: Memory;Awareness;Problem solving;Safety/judgement Memory: Decreased short-term memory Safety/Judgement: Decreased awareness of deficits;Decreased awareness of safety Problem Solving: Difficulty sequencing;Requires verbal cues;Requires tactile cues General Comments: pt more oriented to situation today     Balance  Balance Overall balance assessment: Needs assistance Sitting-balance support: Feet supported;Bilateral upper extremity supported Sitting balance-Leahy Scale: Fair Sitting balance - Comments: post bias Postural control: Posterior lean Standing balance support: During functional activity;Bilateral upper extremity supported Standing balance-Leahy Scale: Poor Standing balance comment: bil Ue supported by RW   End of Session PT - End of Session Equipment Utilized During Treatment: Gait belt Activity Tolerance: Patient tolerated treatment well Patient left: in chair;with call bell/phone within reach Nurse Communication: Mobility status;Precautions   GP     Gustavus Bryant, [place] 02/24/2013, 3:08 PM

## 2013-02-25 LAB — BASIC METABOLIC PANEL
BUN: 8 mg/dL (ref 6–23)
CHLORIDE: 101 meq/L (ref 96–112)
CO2: 24 meq/L (ref 19–32)
Calcium: 10.4 mg/dL (ref 8.4–10.5)
Creatinine, Ser: 0.87 mg/dL (ref 0.50–1.10)
GFR calc Af Amer: 76 mL/min — ABNORMAL LOW (ref >90)
GFR calc non Af Amer: 65 mL/min — ABNORMAL LOW (ref >90)
Glucose, Bld: 93 mg/dL (ref 70–99)
Potassium: 4.5 mEq/L (ref 3.7–5.3)
Sodium: 137 mEq/L (ref 137–147)

## 2013-02-25 LAB — CBC
HEMATOCRIT: 29.8 % — AB (ref 36.0–46.0)
Hemoglobin: 9.6 g/dL — ABNORMAL LOW (ref 12.0–15.0)
MCH: 27.3 pg (ref 26.0–34.0)
MCHC: 32.2 g/dL (ref 30.0–36.0)
MCV: 84.7 fL (ref 78.0–100.0)
Platelets: 372 10*3/uL (ref 150–400)
RBC: 3.52 MIL/uL — AB (ref 3.87–5.11)
RDW: 15.8 % — ABNORMAL HIGH (ref 11.5–15.5)
WBC: 9.9 10*3/uL (ref 4.0–10.5)

## 2013-02-25 NOTE — Progress Notes (Signed)
Occupational Therapy Treatment Patient Details Name: Ashley West MRN: [id number] DOB: 1940-01-26 Today's Date: 02/25/2013 Time: 9562-1308 OT Time Calculation (min): 31 min  OT Assessment / Plan / Recommendation  History of present illness Pt is a 73 y.o. female brought in secondary to increased amount of falls and numbness around buttocks and umbilicus region. 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 Pt s/p THORACIC LAMINECTOMY FOR TUMOR (N/A) T 5 - T 8   OT comments  Pt ambulated to bathroom today and performed toileting and grooming. Pt progressing towards goals. Continue to recommend CIR for additional rehab prior to d/c home.  Follow Up Recommendations  CIR;Supervision/Assistance - 24 hour    Barriers to Discharge       Equipment Recommendations  3 in 1 bedside comode;Tub/shower bench    Recommendations for Other Services Rehab consult  Frequency Min 3X/week   Progress towards OT Goals Progress towards OT goals: Progressing toward goals  Plan Discharge plan remains appropriate    Precautions / Restrictions Precautions Precautions: Fall;Back Precaution Comments: Reviewed precautions.  Restrictions Weight Bearing Restrictions: No   Pertinent Vitals/Pain No pain reported.     ADL  Grooming: Wash/dry hands;Teeth care;Minimal assistance Where Assessed - Grooming: Supported standing Lower Body Dressing: Moderate assistance Where Assessed - Lower Body Dressing: Supported sit to Lobbyist: Moderate assistance;+2 Total assistance Toilet Transfer Method: Sit to stand Toilet Transfer Equipment: Comfort height toilet;Grab bars Toileting - Clothing Manipulation and Hygiene: Minimal assistance/Moderate assistance (balance) Where Assessed - Toileting Clothing Manipulation and Hygiene: Standing Equipment Used: Gait belt;Rolling walker Transfers/Ambulation Related to ADLs: +2 Total A/Mod A for ambulation. Mod A for transfers. ADL Comments: Educated on  technique for LB dressing. Educated on use of cup to avoid bending and explained her precautions are more for comfort. Recommended sitting for dressing (not to do this alone) and to stand in front of chair/bed with walker in front when pulling up LB clothing or she could lean side to side. Cues for pt to get closer to sink to use it for balance.   OT Diagnosis:    OT Problem List:   OT Treatment Interventions:     OT Goals(current goals can now be found in the care plan section) Acute Rehab OT Goals Patient Stated Goal: not stated OT Goal Formulation: With patient Time For Goal Achievement: 03/09/13 Potential to Achieve Goals: Good ADL Goals Pt Will Perform Lower Body Bathing: with min assist;with caregiver independent in assisting;sitting/lateral leans Pt Will Perform Lower Body Dressing: with min assist;with caregiver independent in assisting;sitting/lateral leans;with adaptive equipment Pt Will Transfer to Toilet: bedside commode;with min assist;stand pivot transfer Pt Will Perform Toileting - Clothing Manipulation and hygiene: with caregiver independent in assisting;with min assist;sitting/lateral leans Additional ADL Goal #1: bed mobility with S in preparatino for ADL  Visit Information  Last OT Received On: 02/25/13 Assistance Needed: +2 History of Present Illness: Pt is a 73 y.o. female brought in secondary to increased amount of falls and numbness around buttocks and umbilicus region. 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 Pt s/p THORACIC LAMINECTOMY FOR TUMOR (N/A) T 5 - T 8    Subjective Data      Prior Functioning       Cognition  Cognition Arousal/Alertness: Awake/alert Behavior During Therapy: WFL for tasks assessed/performed Overall Cognitive Status: Impaired/Different from baseline Area of Impairment: Memory;Safety/judgement Memory: Decreased short-term memory Safety/Judgement: Decreased awareness of deficits    Mobility  Bed Mobility Overal  bed mobility: Needs Assistance Bed Mobility:  Rolling;Sidelying to Sit Rolling: Min guard Sidelying to sit: Min assist General bed mobility comments: Cues for technique. Transfers Overall transfer level: Needs assistance Equipment used: Rolling walker (2 wheeled) Transfers: Sit to/from Stand Sit to Stand: +2 safety/equipment;Mod assist General transfer comment: cues for technique. Ataxic gait pattern.    Exercises      Balance    End of Session OT - End of Session Equipment Utilized During Treatment: Gait belt;Rolling walker Activity Tolerance: Patient tolerated treatment well Patient left: in chair;with call bell/phone within reach;with family/visitor present  GO     Benito Mccreedy OTR/L [phone] 02/25/2013, 4:05 PM

## 2013-02-25 NOTE — Progress Notes (Signed)
Patient ID: Ashley West, female   DOB: 03-17-1940, 73 y.o.   MRN: [id number] Afeb, vss Neuro steadily improving. Able to walk with a walker. Will transfer to floor. Plan per Dr Vertell Limber

## 2013-02-26 ENCOUNTER — Encounter (HOSPITAL_COMMUNITY): Payer: Self-pay | Admitting: Physical Medicine and Rehabilitation

## 2013-02-26 DIAGNOSIS — C720 Malignant neoplasm of spinal cord: Secondary | ICD-10-CM

## 2013-02-26 LAB — TYPE AND SCREEN
ABO/RH(D): A POS
ANTIBODY SCREEN: NEGATIVE
Unit division: 0
Unit division: 0

## 2013-02-26 LAB — GLUCOSE, CAPILLARY: Glucose-Capillary: 95 mg/dL (ref 70–99)

## 2013-02-26 NOTE — Progress Notes (Signed)
Rehab admissions - I met with pt and explained the purpose of inpatient rehab. Pt is interested in pursuing acute inpatient rehab but does have some concerns about her co-pays with [facility]. Informational brochures given and questions answered.  Will open case with [facility] and seek authorization if pt is agreeable to copay amounts. Will follow up with pt in am. Please call me with any questions.   Thanks.  Nanetta Batty, PT Rehabilitation Admissions Coordinator [phone]

## 2013-02-26 NOTE — Progress Notes (Signed)
UR COMPLETED  

## 2013-02-26 NOTE — Consult Note (Signed)
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  Referring Physician: Dr. Vertell Limber    HPI: Ashley West is a 73 y.o. female with history of HTN, diverticulosis with GIB,  back pain with BLE numbness and weakness with falls as well as issues with incontinence and constipation. She was admitted on 02/12/145 via MD office for work up as MRI of spine revealed diffuse metastatic disease to spine with extradural tumor extending into the spinal canal at T5-6-7 and circumferentially compresses the spinal cord. Suspicion of metastatic breast cancer v/s undifferentiated adenocarcinoma of unknown origin. PT/OT evaluations done this weekend revealing BLE weakness with shuffling gait as well as impaired postural reactions. MD, rehab team recommending CIR.       Review of Systems  HENT: Negative for hearing loss.   Eyes: Negative for blurred vision and double vision.  Respiratory: Positive for shortness of breath. Negative for cough.   Cardiovascular: Negative for chest pain and palpitations.  Gastrointestinal: Positive for abdominal pain. Negative for nausea and vomiting.  Genitourinary: Positive for urgency and frequency.  Musculoskeletal: Positive for back pain and myalgias.  Neurological: Negative for dizziness and headaches.  Psychiatric/Behavioral: Negative for depression. The patient is nervous/anxious.    Past Medical History  Diagnosis Date  . Hypertension    Past Surgical History  Procedure Laterality Date  . Abdominal hysterectomy    . Cataract extraction      Bilateral  . Cyst excision      Left Elbow, Arm and back  . Esophagogastroduodenoscopy N/A 04/20/2012    Procedure: ESOPHAGOGASTRODUODENOSCOPY (EGD);  Surgeon: Cleotis Nipper, MD;  Location: [facility] ENDOSCOPY;  Service: Endoscopy;  Laterality: N/A;  . Laminectomy N/A 02/22/2013    Procedure: THORACIC LAMINECTOMY FOR TUMOR;  Surgeon: Erline Levine, MD;   Location: [facility]URO ORS;  Service: Neurosurgery;  Laterality: N/A;   Family History  Problem Relation Age of Onset  . Breast cancer Sister   . Cancer Sister     uterine v/s ovarina?   Social History:  Married. 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 She reports that she has never smoked. She does not have any smokeless tobacco history on file. She reports that she drinks about 3 beers daily.  She reports that she does not use illicit drugs.  Allergies: No Known Allergies  Medications Prior to Admission  Medication Sig Dispense Refill  . Ascorbic Acid (VITAMIN C) 500 MG CHEW Chew 500 mg by mouth daily.      Marland Kitchen b complex vitamins tablet Take 1 tablet by mouth daily.      . diclofenac (VOLTAREN) 75 MG EC tablet Take 75 mg by mouth 2 (two) times daily as needed (pain).      Marland Kitchen losartan-hydrochlorothiazide (HYZAAR) 50-12.5 MG per tablet Take 1 tablet by mouth daily.      . Multiple Vitamin (MULTIVITAMIN WITH MINERALS) TABS Take 1 tablet by mouth daily.      Marland Kitchen omeprazole (PRILOSEC) 20 MG capsule Take 20 mg by mouth daily.      . polyethylene glycol (MIRALAX / GLYCOLAX) packet Take 17 g by mouth daily.      . potassium gluconate 595 MG TABS Take 1,190 mg by mouth. Takes 2 tablets @ 99mg  equivalent each.      . ranitidine (ZANTAC) 150 MG tablet Take 150 mg by mouth 2 (two) times daily.      . vitamin E (VITAMIN E)  400 UNIT capsule Take 400 Units by mouth daily.        Home: Home Living Family/patient expects to be discharged to:: Private residence Living Arrangements: Spouse/significant other Available Help at Discharge: Family;Available 24 hours/day Type of Home: House Home Access: Stairs to enter [employer] of Steps: 1 Home Layout: One level Home Equipment: Cane - single point  Functional History: Prior Function Comments: PTA pt was ambulating without AD and was independent with ADLs Functional Status:  Mobility:      Ambulation/Gait Ambulation Distance (Feet): 16 Feet Gait velocity: very decreased General Gait Details: pt with increased hyperextension in Lt LE with ambulating; short shuffled gt and (A) to maintain balance; pt leaning posterioly at times and requires (A) to maintain balance and manage RW; pt fatigued quickly; cues for gt sequencing     ADL: ADL Grooming: Wash/dry hands;Teeth care;Minimal assistance Where Assessed - Grooming: Supported standing Upper Body Bathing: Set up;Supervision/safety Where Assessed - Upper Body Bathing: Supported sitting Lower Body Bathing: Maximal assistance Where Assessed - Lower Body Bathing: Supported sit to stand Upper Body Dressing: Set up;Supervision/safety Where Assessed - Upper Body Dressing: Supported sitting Lower Body Dressing: Moderate assistance Where Assessed - Lower Body Dressing: Supported sit to Lobbyist: Moderate assistance;+2 Total assistance Toilet Transfer Method: Sit to stand Toilet Transfer Equipment: Comfort height toilet;Grab bars Equipment Used: Gait belt;Rolling walker Transfers/Ambulation Related to ADLs: +2 Total A/Mod A for ambulation. Mod A for transfers. ADL Comments: Educated on technique for LB dressing. Educated on use of cup to avoid bending and explained her precautions are more for comfort. Recommended sitting for dressing (not to do this alone) and to stand in front of chair/bed with walker in front when pulling up LB clothing.  Cognition: Cognition Overall Cognitive Status: Impaired/Different from baseline Orientation Level: Oriented X4 Cognition Arousal/Alertness: Awake/alert Behavior During Therapy: WFL for tasks assessed/performed Overall Cognitive Status: Impaired/Different from baseline Area of Impairment: Memory;Safety/judgement Memory: Decreased short-term memory Safety/Judgement: Decreased awareness of deficits Awareness: Emergent Problem Solving: Difficulty sequencing;Requires verbal  cues;Requires tactile cues General Comments: pt more oriented to situation today   Blood pressure 148/67, pulse 79, temperature 98.9 F (37.2 C), temperature source Oral, resp. rate 16, height 5\' 2"  (1.575 m), weight 60 kg (132 lb 4.4 oz), SpO2 99.00%. Physical Exam  Nursing note and vitals reviewed. Constitutional: She is oriented to person, place, and time. She appears well-developed and well-nourished.  HENT:  Head: Normocephalic and atraumatic.  Eyes: Conjunctivae are normal. Pupils are equal, round, and reactive to light.  Neck: Normal range of motion.  Cardiovascular: Normal rate and regular rhythm.   No murmur heard. Respiratory: Effort normal and breath sounds normal. No respiratory distress. She has no wheezes.  GI: Soft. Bowel sounds are normal. She exhibits no distension.  Musculoskeletal: She exhibits no edema and no tenderness.  Neurological: She is alert and oriented to person, place, and time.  Skin: Skin is warm and dry.    No results found for this or any previous visit (from the past 24 hour(s)). No results found.  Assessment/Plan: Diagnosis: thoracic tumor s/p resection, ?primary 1. Does the need for close, 24 hr/day medical supervision in concert with the patient's rehab needs make it unreasonable for this patient to be served in a less intensive setting? Yes 2. Co-Morbidities requiring supervision/potential complications: htn, anemia 3. Due to bladder management, bowel management, safety, skin/wound care, disease management, medication administration, pain management and patient education, does the patient require 24 hr/day rehab nursing? Potentially  4. Does the patient require coordinated care of a physician, rehab nurse, PT (1-2 hrs/day, 5 days/week) and OT (1-2 hrs/day, 5 days/week) to address physical and functional deficits in the context of the above medical diagnosis(es)? Yes Addressing deficits in the following areas: balance, endurance, locomotion,  strength, transferring, bowel/bladder control, bathing, dressing, feeding, grooming and toileting 5. Can the patient actively participate in an intensive therapy program of at least 3 hrs of therapy per day at least 5 days per week? Yes 6. The potential for patient to make measurable gains while on inpatient rehab is good 7. Anticipated functional outcomes upon discharge from inpatient rehab are mod I with PT, mod I with OT, n/a with SLP. 8. Estimated rehab length of stay to reach the above functional goals is: 7 days 9. Does the patient have adequate social supports to accommodate these discharge functional goals? Yes 10. Anticipated D/C setting: Home 11. Anticipated post D/C treatments: [place] therapy 12. Overall Rehab/Functional Prognosis: excellent  RECOMMENDATIONS: This patient's condition is appropriate for continued rehabilitative care in the following setting: CIR Patient has agreed to participate in recommended program. Yes and Potentially Note that insurance prior authorization may be required for reimbursement for recommended care.  Comment: Would like to see her performance today with therapies. Appears to be improving but may need a brief stay to reach mod I goals. Rehab Admissions Coordinator to follow up.  Thanks,  Meredith Staggers, MD, Mellody Drown     02/26/2013

## 2013-02-26 NOTE — Progress Notes (Signed)
Subjective: Patient reports "I don't hurt anywhere, my bowels just havent moved"  Objective: Vital signs in last 24 hours: Temp:  [98.3 F (36.8 C)-99.6 F (37.6 C)] 98.9 F (37.2 C) (02/16 0525) Pulse Rate:  [78-96] 79 (02/16 0525) Resp:  [16-22] 16 (02/16 0525) BP: (123-153)/(49-77) 148/67 mmHg (02/16 0525) SpO2:  [97 %-100 %] 99 % (02/16 0525)  Intake/Output from previous day: 02/15 0701 - 02/16 0700 In: 249 [P.O.:240; I.V.:9] Out: 300 [Urine:300] Intake/Output this shift:    Alert, conversant. MAEW, but notes LLE weakness with ambulation attempts. Incision with honeycomb drsg intact, dry. No erythema, swelling, or drainage.   Lab Results:  Recent Labs  02/24/13 0255 02/25/13 0335  WBC 12.1* 9.9  HGB 9.1* 9.6*  HCT 27.5* 29.8*  PLT 366 372   BMET  Recent Labs  02/24/13 0255 02/25/13 0335  NA 138 137  K 4.3 4.5  CL 105 101  CO2 24 24  GLUCOSE 124* 93  BUN 7 8  CREATININE 0.74 0.87  CALCIUM 9.8 10.4    Studies/Results: No results found.  Assessment/Plan: Improving   LOS: 4 days  Per Dr. Vertell Limber, ok for CIR c/s as recommended by PT/OT. Will work on bowels this am. Dulcolax suppository prn on MAR. Dr. Vertell Limber will c/s Oncology & Rad Onc.   Verdis Prime 02/26/2013, 8:34 AM    Patient improved greatly following decompressive surgery.  I have encouraged her to go to Rehab.  Oncology and Rad Onc consults pending.

## 2013-02-26 NOTE — Progress Notes (Signed)
Rehab Admissions Coordinator Note:  Patient was screened by Cleatrice Burke for appropriateness for an Inpatient Acute Rehab Consult.  At this time, inpt rehab consult pending today and an Admissions Coordinator will follow up after consult complete.  Cleatrice Burke 02/26/2013, 9:23 AM  I can be reached at [phone].

## 2013-02-26 NOTE — Progress Notes (Signed)
Physical Therapy Treatment Patient Details Name: Ashley West MRN: [id number] DOB: [birth year] Today's Date: 02/26/2013 Time: 2426-8341 PT Time Calculation (min): 23 min  PT Assessment / Plan / Recommendation  History of Present Illness Pt is a 73 y.o. female brought in secondary to increased amount of falls and numbness around buttocks and umbilicus region. 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 Pt s/p THORACIC LAMINECTOMY FOR TUMOR (N/A) T 5 - T 8   PT Comments   Patient progressing well with ambulation. She was highly motivated today. Continue to recommend comprehensive inpatient rehab (CIR) for post-acute therapy needs.   Follow Up Recommendations  CIR     Does the patient have the potential to tolerate intense rehabilitation     Barriers to Discharge        Equipment Recommendations       Recommendations for Other Services    Frequency Min 4X/week   Progress towards PT Goals Progress towards PT goals: Progressing toward goals  Plan Current plan remains appropriate    Precautions / Restrictions Precautions Precautions: Fall;Back Precaution Comments: Reviewed precautions.    Pertinent Vitals/Pain no apparent distress     Mobility  Bed Mobility Overal bed mobility: Needs Assistance Bed Mobility: Sit to Supine Sit to supine: Min assist General bed mobility comments: Cues for technique. A for LEs Transfers Overall transfer level: Needs assistance Equipment used: Rolling walker (2 wheeled) Sit to Stand: Mod assist General transfer comment: A to power up into standing and cues for hand placement and safety with Rw Ambulation/Gait Ambulation/Gait assistance: Mod assist Ambulation Distance (Feet): 18 Feet Assistive device: Rolling walker (2 wheeled) Gait Pattern/deviations: Wide base of support;Step-through pattern;Decreased stride length Gait velocity: very decreased Gait velocity interpretation: <1.8 ft/sec, indicative of risk for recurrent  falls General Gait Details: A for use of RW and for balance. When patient stops she has posterior bias. Mod A x2 to prevent falls    Exercises     PT Diagnosis:    PT Problem List:   PT Treatment Interventions:     PT Goals (current goals can now be found in the care plan section)    Visit Information  Last PT Received On: 02/26/13 Assistance Needed: +2 (for chair follow with ambulation) History of Present Illness: Pt is a 73 y.o. female brought in secondary to increased amount of falls and numbness around buttocks and umbilicus region. 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 Pt s/p THORACIC LAMINECTOMY FOR TUMOR (N/A) T 5 - T 8    Subjective Data      Cognition  Cognition Arousal/Alertness: Awake/alert Behavior During Therapy: WFL for tasks assessed/performed Overall Cognitive Status: Within Functional Limits for tasks assessed    Balance     End of Session PT - End of Session Equipment Utilized During Treatment: Gait belt Activity Tolerance: Patient tolerated treatment well Patient left: in chair;with call bell/phone within reach   GP     Robin[place], Tonia Brooms 02/26/2013, 2:52 PM 02/26/2013 Jacqualyn Posey PTA [phone] pager [phone] office

## 2013-02-27 DIAGNOSIS — C50919 Malignant neoplasm of unspecified site of unspecified female breast: Secondary | ICD-10-CM

## 2013-02-27 DIAGNOSIS — C779 Secondary and unspecified malignant neoplasm of lymph node, unspecified: Secondary | ICD-10-CM

## 2013-02-27 DIAGNOSIS — M545 Low back pain, unspecified: Secondary | ICD-10-CM

## 2013-02-27 DIAGNOSIS — C7952 Secondary malignant neoplasm of bone marrow: Secondary | ICD-10-CM

## 2013-02-27 DIAGNOSIS — C7951 Secondary malignant neoplasm of bone: Secondary | ICD-10-CM

## 2013-02-27 MED ORDER — INFLUENZA VAC SPLIT QUAD 0.5 ML IM SUSP
0.5000 mL | INTRAMUSCULAR | Status: DC
  Filled 2013-02-27: qty 0.5

## 2013-02-27 NOTE — Progress Notes (Signed)
   This NP attempted to meet with the patient for a meeting to discuss Goals of Care and options.    Patient adamantly verbalized she had no desire to speak with Palliative Care and refused to let me contact her husband.   I left my care and encouraged her to call with questions or concerns and attempted to educate her on what Palliative Medici en was and how we may be of assistance.  Will sign off, thank you for the consult and please let us know if we can be of any assistance in the future.  Wadie Lessen NP  Palliative Medicine Team Team Phone # [phone] Pager [phone]

## 2013-02-27 NOTE — Clinical Social Work Placement (Addendum)
Clinical Social Work Department CLINICAL SOCIAL WORK PLACEMENT NOTE 02/27/2013  Patient:  Ashley West, Ashley West  Account Number:  [id number] Admit date:  02/22/2013  Clinical Social Worker:  Raquel Sarna [place], LCSWA  Date/time:  02/27/2013 02:08 PM  Clinical Social Work is seeking post-discharge placement for this patient at the following level of care:   [place]   (*CSW will update this form in Epic as items are completed)   02/27/2013  Patient/family provided with [place] Department of Clinical Social Works list of facilities offering this level of care within the geographic area requested by the patient (or if unable, by the patients family).  02/27/2013  Patient/family informed of their freedom to choose among providers that offer the needed level of care, that participate in Medicare, Medicaid or managed care program needed by the patient, have an available bed and are willing to accept the patient.  02/27/2013  Patient/family informed of [facility] ownership interest in [facility], as well as of the fact that they are under no obligation to receive care at this facility.  PASARR submitted to EDS on 02/27/2013 PASARR number received from EDS on 02/27/2013  FL2 transmitted to all facilities in geographic area requested by pt/family on  02/27/2013 FL2 transmitted to all facilities within larger geographic area on   Patient informed that his/her managed care company has contracts with or will negotiate with  certain facilities, including the following:     Patient/family informed of bed offers received:  02/28/2013 Patient chooses bed at  [facility] Physician recommends and patient chooses bed at    Patient to be transferred to  on  02/28/2013 Patient to be transferred to facility by  [facility]  The following physician request were entered in Epic:   Additional Comments:  Pati Gallo, [place] Worker [phone]

## 2013-02-27 NOTE — Progress Notes (Signed)
Rehab admissions - I spoke with pt and shared the specifics of her [employer] PPO policy, including the $494 copay per day for inpatient rehab ($1590 per admit maximum). Pt was not comfortable with the cost of these copays and shared that this is not a realistic possibility for her/family. Skilled nursing is covered at 100% for first 20 days and pt was interested in pursuing this option instead.   Updated Hassan Rowan case manager and Raquel Sarna, social worker of pt's preference for skilled nursing at discharge. Will sign off for now. Please call me with any questions. Thanks.  Nanetta Batty, PT Rehabilitation Admissions Coordinator [phone]

## 2013-02-27 NOTE — Consult Note (Signed)
[place]  Telephone:(336) Russell  DOB: March 05, 1940  MR#: [id number]  CSN#: [id number]    Requesting Physician: Ritta Slot, Neurosurgery.  Primary MD: [name], MD  History of present illness:    Ashley West is a 73 year African American female admitted from her PCP office on 02/22/13  to [facility] with  1 month history of worsening  lower back pain, complicated with bilateral lower extremity weakness and numbness with subsequent gait instability, accompanied by urinary incontinence and constipation. 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 Extradural tumor extends into the spinal canal at T5-6-7 and circumferentially compresses the spinal cord. Patchy tumor seen throughout the spine including the sacrum. There is no pathologic fracture. The most extensive tumors and T12 and S1. No adenopathy was noted. No visible lesions in the liver or kidneys. No ascites.Labs showed hypercalcemia, with Ca levels of 11.6, now normalized. Neurosurgical consultation was obtained. Due to cord compression,  an emergent  thoracic spine decompression  was performed. She had T5-T8 laminectomy and resection of epidural metastasis.Thoracic nerve roots appeared infiltrated with tumor but were preserved. Pathology is currently pending As of 2/14 her neurological status had improved, with increased lower extremitiy strength. She was able to ambulate with assistance as of 2/15.her back pain has resolved. She is to be transferred to inpatient rehab. We hve been kindly requested to see the patient anticipating that she sill need oncological follow up. Palliative medicine is involved in her care.     Past medical history:      Past Medical History  Diagnosis Date  . Hypertension   Chronic anemia Alcohol habituation History of hiatal hernia  04/2012 Prior history of fibroids Diverticulosis  Past surgical history:      Past Surgical History  Procedure Laterality Date  . Abdominal hysterectomy (fibroids 1971)    . Cataract extraction      Bilateral  . Cyst excision      Left Elbow, Arm and back  . Esophagogastroduodenoscopy N/A 04/20/2012    Procedure: ESOPHAGOGASTRODUODENOSCOPY (EGD);  Surgeon: Cleotis Nipper, MD;  [facility] ENDOSCOPY;  Service: Endoscopy;  Laterality: N/A;  . Laminectomy N/A 02/22/2013    Procedure: THORACIC LAMINECTOMY FOR TUMOR;  Surgeon: Erline Levine, MD;  Location: [facility]O [facility]S;  Service: Neurosurgery;  Laterality: N/A;    Medications:  Scheduled Meds: . [START ON 02/28/2013] influenza vac split quadrivalent PF  0.5 mL Intramuscular Tomorrow-1000  . pantoprazole  40 mg Oral Q1200  . polyethylene glycol  17 g Oral Daily  . senna  1 tablet Oral BID  . sodium chloride  3 mL Intravenous Q12H   Continuous Infusions: . sodium chloride     PRN Meds:.acetaminophen, bisacodyl, hydrALAZINE, morphine injection, ondansetron (ZOFRAN) IV, oxyCODONE-acetaminophen, polyethylene glycol, sodium chloride TKZ:SWFUXNATFTDDU, bisacodyl, hydrALAZINE, morphine injection, ondansetron (ZOFRAN) IV, oxyCODONE-acetaminophen, polyethylene glycol, sodium chloride  Allergies: No Known Allergies  Family history:     Family History  Problem Relation Age of Onset  . Breast cancer Sister   . Cancer Sister     uterine v/s ovarian   ETOH cirrhosis                                    Brother MI  [place]                                         Social history:  Married. Lives in [place]. Retired Scientist, water quality. Full Code  Health maintenance:                 History  Substance Use Topics  . Smoking status: Never Smoker   . Smokeless tobacco: Not on file  . Alcohol Use: 12.6 oz/week    21 Cans of beer per week     Comment: everyday      Colonoscopy:07/18/91  PAP:  1993  Bone density: never  Cholesterol:              MGM: 1993             Up to date with immunizations  GDJ:MEQAST of Systems  Constitutional: Positive for unintentional 10  weight loss in the last 6 months. Negative for fever, chills and malaise/fatigue.  Eyes: Negative for blurred vision and double vision.  Respiratory: Negative for cough, hemoptysis, mild shortness of breath.  Cardiovascular: Positive for chest pain. GI: No nausea vomiting diarrhea. Positive constipation. No change in bowel caliber. No  Melena No   Hematochezia.GERD smptoms GU: No blood in urine. loss of urinary control , now resolved. Musculoskeletal: back pain as above. Now much improved, Neurological As per HPI. No Headache     Physical exam:      Filed Vitals:   02/27/13 1046  BP: 135/51  Pulse: 86  Temp: 98.1 F (36.7 C)  Resp: 20     Body mass index is 24.19 kg/(m^2).    General:  73 -year-old AAF   in no acute distress A. and O. x3  well-developed and well-nourished.  HEENT: Normocephalic, atraumatic, PERRLA. Oral cavity without thrush or lesions. Neck supple. no thyromegaly, no cervical or supraclavicular adenopathy  Lungs clear bilaterally . No wheezing, rhonchi or rales. No axillary nodes Breasts: No masses noted Cardiac regular rate and rhythm, no murmur , rubs or gallops Abdomen soft nontender , bowel sounds x4. No HSM. No masses palpable.  GU/rectal: deferred. Extremities no clubbing, no  cyanosis or edema. No bruising or petechial rash Musculoskeletal: no spinal tenderness.  Neuro:able to move all extremities. As per Neuro.  Lab results:       CBC  Recent Labs Lab 02/22/13 1126 02/22/13 2037 02/24/13 0255 02/25/13 0335  WBC 6.9  --  12.1* 9.9  HGB 12.2 10.2* 9.1* 9.6*  HCT 36.1 30.0* 27.5* 29.8*  PLT 397  --  366 372  MCV 81.7  --  83.8 84.7  MCH 27.6  --  27.7 27.3  MCHC 33.8  --  33.1 32.2  RDW 14.5  --  15.3 15.8*  LYMPHSABS 0.7  --  1.1  --   MONOABS 0.4  --  0.8  --    EOSABS 0.0  --  0.0  --   BASOSABS 0.0  --  0.0  --     Chemistries   Recent Labs Lab 02/22/13 1126 02/22/13 2037 02/24/13 0255 02/25/13 0335  NA 133* 133* 138 137  K 4.3 3.5* 4.3 4.5  CL 92*  --  105 101  CO2 27  --  24 24  GLUCOSE 116*  --  124* 93  BUN 8  --  7 8  CREATININE 0.71  --  0.74 0.87  CALCIUM 11.6*  --  9.8 10.4     Coagulation profile  Recent Labs Lab 02/22/13 1126 02/22/13 1331  INR 1.00 1.02    Studies:     Mr Thoracic Spine W Wo Contrast  02/22/2013   FINDINGS: The patient has diffuse osseous metastases throughout the visualized portion of the spine. Tumor invades the spinal canal at T5, T6, and T7 and circumferentially compresses the thecal sac and spinal cord. There is no cortical destruction of the posterior margins of the vertebra at that level. Tumor does extend into the right paraspinal soft tissues at T6.  There are no discrete lesions in the lungs. There is no visible hilar or mediastinal adenopathy and there is no visible tumor in the liver or spleen.  There is no pathologic fracture in the thoracic spine at this time. 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  After contrast administration there is patchy enhancement of the extensive tumor. There is enhancement of the extradural tumor compressing the spinal cord at the T5-6-7 level. The scout image demonstrates that there is tumor throughout the cervical spine as well.  IMPRESSION: 1. 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 Extradural tumor extends into the spinal canal at T5-6-7 and circumferentially compresses the spinal cord. The appearance is not specific but metastatic breast cancer should be strongly considered. 2. Critical Value/emergent results were called by telephone at the time of interpretation on 02/22/2013 at 4:25 PM to Dr. Calvert Cantor , who verbally acknowledged these results.   Electronically Signed   By: Rozetta Nunnery M.D.   On: 02/22/2013 16:33   Mr  Lumbar Spine W Wo Contrast  02/22/2013  FINDINGS: Tip of the conus is at L2. The distal spinal cord appears normal. The paraspinal soft tissues are normal. There is no adenopathy or visible abdominal mass lesion. The patient does have extensive diverticulosis of the distal colon.  The patient has patchy tumor throughout the visualized portion of the spine including the sacrum. There is no pathologic fracture. The most extensive tumors and T12 and S1. The discs throughout the lumbar spine are essentially normal. There is moderate right facet arthritis at L3-4 and L4-5 and L5-S1.  There is no visible tumor extension into the spinal canal.  After contrast administration there is enhancement around the arthritic facet joints in the lower lumbar spine. The tumor does not enhance on these images.  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 This is of unknown origin. Given the lack of adenopathy and with no visible lesions in the liver or kidneys and no ascites, I suspect this may represent breast cancer although I have seen this appearance with undifferentiated adenocarcinoma of unknown origin.   Electronically Signed   By: Rozetta Nunnery M.D.   On: 02/22/2013 16:42    Assessmnent/Plan:72 y.o. female admitted  with one month history of progressive lower  back pain complicated by lower extremi  incontinence and constipation. MRI of the spine revealed significantthoracic spine involvement of a tumor, requiring emergent decompression on 02/22/13. PAthology is pending. Dr. Humphrey Rolls to proceed with recommendations when path becomes available, as treatment options pend on the primary patient has a strong family history of gyn cancer. Will rule out upon diagnosis. Addendum to be written.  Thank you for the referral.   Rondel Jumbo, PA-C 02/27/2013  ATTENDING'S ATTESTATION:  I personally reviewed patient's chart, examined patient myself, formulated the treatment plan as followed.    Spoke to patient in  consultation today. Her husband  was at work. Went over her history as well as her scans with her. We discussed her pathology reports are not back yet. We do need to know what her final pathology results. She understands that we will make final treatment decisions depending on the path report. She may require radiation therapy as well as chemotherapy. I will followup with the patient in my clinic next week. We will call her with time and date.  Marcy Panning, MD Medical/Oncology [facility] [phone] (beeper) [phone] (Office)  02/27/2013, 8:32 PM

## 2013-02-27 NOTE — Progress Notes (Signed)
Physical Therapy Treatment Patient Details Name: TINLEE NAVARRETTE MRN: [id number] DOB: September 27, 1940 Today's Date: 02/27/2013 Time: 6440-3474 PT Time Calculation (min): 25 min  PT Assessment / Plan / Recommendation  History of Present Illness Pt is a 73 y.o. female brought in secondary to increased amount of falls and numbness around buttocks and umbilicus region. 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 Pt s/p THORACIC LAMINECTOMY FOR TUMOR (N/A) T 5 - T 8   PT Comments   Patient progressing with ambulation slowly this session. Patient is now planning to go SNF as she and her husband cannot afford copay for CIR.   Follow Up Recommendations  SNF     Does the patient have the potential to tolerate intense rehabilitation     Barriers to Discharge        Equipment Recommendations       Recommendations for Other Services Rehab consult  Frequency Min 4X/week   Progress towards PT Goals Progress towards PT goals: Progressing toward goals  Plan Current plan remains appropriate    Precautions / Restrictions Precautions Precautions: Fall;Back Precaution Comments: Reviewed precautions.    Pertinent Vitals/Pain no apparent distress     Mobility  Bed Mobility Rolling: Min guard Sidelying to sit: Min assist General bed mobility comments: Cues for technique. A for trunk support into sitting Transfers Overall transfer level: Needs assistance Equipment used: Rolling walker (2 wheeled) Sit to Stand: Mod assist General transfer comment: A to power up into standing and cues for hand placement and safety with Rw Ambulation/Gait Ambulation/Gait assistance: Mod assist Ambulation Distance (Feet): 25 Feet Assistive device: Rolling walker (2 wheeled) Gait Pattern/deviations: Step-through pattern;Decreased stride length Gait velocity: very decreased General Gait Details: A for use of RW and for balance. Patient fatigues and LEs start to shake and buckle with increased ambulation     Exercises     PT Diagnosis:    PT Problem List:   PT Treatment Interventions:     PT Goals (current goals can now be found in the care plan section)    Visit Information  Last PT Received On: 02/27/13 Assistance Needed: +2 (chair follow with ambulation) History of Present Illness: Pt is a 73 y.o. female brought in secondary to increased amount of falls and numbness around buttocks and umbilicus region. 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 Pt s/p THORACIC LAMINECTOMY FOR TUMOR (N/A) T 5 - T 8    Subjective Data      Cognition  Cognition Arousal/Alertness: Awake/alert Behavior During Therapy: WFL for tasks assessed/performed Area of Impairment: Memory;Safety/judgement Memory: Decreased short-term memory    Balance     End of Session PT - End of Session Equipment Utilized During Treatment: Gait belt Activity Tolerance: Patient tolerated treatment well Patient left: in chair;with call bell/phone within reach Nurse Communication: Mobility status;Precautions   GP     Jacqualyn Posey 02/27/2013, 2:21 PM 02/27/2013 Jacqualyn Posey PTA [phone] pager [phone] office

## 2013-02-27 NOTE — Clinical Social Work Psychosocial (Signed)
Clinical Social Work Department BRIEF PSYCHOSOCIAL ASSESSMENT 02/27/2013  Patient:  Ashley West, Ashley West     Account Number:  [id number]     Admit date:  02/22/2013  Clinical Social Worker:  Donna Christen  Date/Time:  02/27/2013 02:01 PM  Referred by:  Physician  Date Referred:  02/27/2013 Referred for  SNF Placement   Other Referral:   none.   Interview type:  Patient Other interview type:   Pt to speak to her husband regarding SNF placement.    PSYCHOSOCIAL DATA Living Status:  HUSBAND Admitted from facility:   Level of care:   Primary support name:  Ashley West Primary support relationship to patient:  SPOUSE Degree of support available:   Strong support system. Pt stated that her husband would take care of her at home. Pt currently works 8-5 Monday-Friday, but comes home for lunch each day (per pt).    CURRENT CONCERNS Current Concerns  Post-Acute Placement   Other Concerns:   none.    SOCIAL WORK ASSESSMENT / PLAN CSW met with pt at bedside after receiving a call from CIR that pt was unable to make co-payment for CIR. CSW introduced herself and explained CSW role at [facility]. Pt stated she was agreeable to speaking with CSW. CSW and pt discussed SNF placement at another option for pt to receive therapy besides CIR. Pt stated that she has had friends in SNF before, and was not sure her or her husband would like for her to be placed in SNF. Pt was, however, agreeable to CSW pursuing SNF placement while she spoke to her husband about the SNF option.   Assessment/plan status:  Psychosocial Support/Ongoing Assessment of Needs Other assessment/ plan:   none.   Information/referral to community resources:   [facility] placement.    PATIENTS/FAMILYS RESPONSE TO PLAN OF CARE: Pt is apprehensive to SNF placement, but will speak to her husband this evening regarding SNF placement. Pt repetively stated that her husband is eligible for retirement and would retire  if he needed to care for her at home. Pt agreeable to see what SNF bed offers are available to her.       Pati Gallo, [place] Social Worker [phone]

## 2013-02-27 NOTE — Progress Notes (Signed)
Subjective: Patient reports "My back just itches. I don't hurt"  Objective: Vital signs in last 24 hours: Temp:  [97.8 F (36.6 C)-100.9 F (38.3 C)] 97.8 F (36.6 C) (02/17 0648) Pulse Rate:  [70-91] 70 (02/17 0648) Resp:  [16-20] 16 (02/17 0648) BP: (118-167)/(51-96) 127/64 mmHg (02/17 0648) SpO2:  [98 %-100 %] 98 % (02/17 0648)  Intake/Output from previous day: 02/16 0701 - 02/17 0700 In: 240 [P.O.:240] Out: -  Intake/Output this shift:    Alert, conversant. MAEW. Equal strength BLE. Incision with honeycomb drsg, intact, dry, without erythema or swelling.  Lab Results:  Recent Labs  02/25/13 0335  WBC 9.9  HGB 9.6*  HCT 29.8*  PLT 372   BMET  Recent Labs  02/25/13 0335  NA 137  K 4.5  CL 101  CO2 24  GLUCOSE 93  BUN 8  CREATININE 0.87  CALCIUM 10.4    Studies/Results: No results found.  Assessment/Plan: Improving   LOS: 5 days  Awaiting insurance approval for CIR.   Verdis Prime 02/27/2013, 7:13 AM    Patient continuing to make progress.  Transfer to Rehab. Awaiting Oncology consultation.

## 2013-02-28 ENCOUNTER — Encounter: Payer: Self-pay | Admitting: Radiation Therapy

## 2013-02-28 NOTE — Clinical Social Work Note (Addendum)
[facility] called and stated that they still did not have insurance authorization from [employer]. Pt cannot be discharged to a SNF until insurance authorization is approved. CSW awaiting call from [facility] regarding insurance authorization. RN notified of information above.    Pt has accepted bed offer from [facility]. CSW has sent discharge summary to [facility]. CSW to complete discharge packet and place on pt's shadow chart. RN to call MD and inform MD of information above. MD to please sign FL2 located on pt's shadow chart. CSW to assist with transportation from [facility] to [facility]. [place] able to accept pt for admission on 02/28/2013.  RN please call report to [facility], [place] Worker [phone]

## 2013-02-28 NOTE — Progress Notes (Signed)
Physical Therapy Treatment Patient Details Name: Ashley West MRN: [id number] DOB: 1940-10-21 Today's Date: 02/28/2013 Time: 0867-6195 PT Time Calculation (min): 24 min  PT Assessment / Plan / Recommendation  History of Present Illness Pt is a 73 y.o. female brought in secondary to increased amount of falls and numbness around buttocks and umbilicus region. 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 Pt s/p THORACIC LAMINECTOMY FOR TUMOR (N/A) T 5 - T 8   PT Comments   Patient progressing well. States she is feeling stronger with every day.  Awaiting SNF placement at this time. Continue with current POC  Follow Up Recommendations  SNF     Does the patient have the potential to tolerate intense rehabilitation     Barriers to Discharge        Equipment Recommendations       Recommendations for Other Services    Frequency Min 4X/week   Progress towards PT Goals Progress towards PT goals: Progressing toward goals  Plan Current plan remains appropriate    Precautions / Restrictions Precautions Precautions: Fall;Back Precaution Comments: Patient able to recall 2/3 precautions. Cues for no arching   Pertinent Vitals/Pain no apparent distress    Mobility  Bed Mobility Rolling: Supervision Sidelying to sit: Supervision General bed mobility comments: Cues for technique. use of railing Transfers Overall transfer level: Needs assistance Equipment used: Rolling walker (2 wheeled) Sit to Stand: Min assist General transfer comment: A for safety and cues for hand placement and safety with Rw Ambulation/Gait Ambulation/Gait assistance: Min assist Ambulation Distance (Feet): 30 Feet Assistive device: Rolling walker (2 wheeled) Gait Pattern/deviations: Step-through pattern;Decreased stride length Gait velocity: very decreased General Gait Details: A for use of RW and for balance. Patient fatigues and LEs start to shake and buckle with increased ambulation    Exercises      PT Diagnosis:    PT Problem List:   PT Treatment Interventions:     PT Goals (current goals can now be found in the care plan section)    Visit Information  Last PT Received On: 02/28/13 Assistance Needed: +1 History of Present Illness: Pt is a 73 y.o. female brought in secondary to increased amount of falls and numbness around buttocks and umbilicus region. 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 Pt s/p THORACIC LAMINECTOMY FOR TUMOR (N/A) T 5 - T 8    Subjective Data      Cognition       Balance     End of Session PT - End of Session Equipment Utilized During Treatment: Gait belt Activity Tolerance: Patient tolerated treatment well Patient left: in chair;with call bell/phone within reach;with chair alarm set Nurse Communication: Mobility status;Precautions   GP     Jacqualyn Posey 02/28/2013, 8:39 AM 02/28/2013 Jacqualyn Posey PTA [phone] pager [phone] office

## 2013-02-28 NOTE — [facility] Oncology Navigator Note (Signed)
Currently working on getting Ashley West scheduled to see Dr. Tammi Klippel for consultation on Wed 2/25 @ 3:00. The type of treatment she has is dependant on the pathology of her tumor. We are waiting on the path report to come in  from her surgery done on 02/22/13, to move forward with radiation treatment planning.    Mont Dutton R.T.(R)(T) Radiation Special Procedures [place] [phone]  Office [phone]  Fax [email or web address]

## 2013-02-28 NOTE — Discharge Summary (Signed)
Physician Discharge Summary  Patient ID: Ashley West MRN: [id number] DOB/AGE: October 01, 1940 73 y.o.  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 Patient has spinal cord compression at T5-T7 due to metastatic cancer of unknown primary with bilateral leg weakness and numbness and pain. It was elected to take her to surgery for T5-T8 laminectomy and resection of epidural metastasis. Following uncomplicated surgery, she recovered in Neuro ICU and transferred to [facility] for nursing care and therapies.  She has progressed steadily with respect to her mobility and resolution of pain. Oncology will follow, directed by pathology which is still pending.     Consults: oncology, radiation oncology  Significant Diagnostic Studies: radiology: X-Ray: intra-operative; pathology  Treatments: surgery: THORACIC LAMINECTOMY FOR TUMOR (N/A) T 5 - T 8    Discharge Exam: Blood pressure 138/59, pulse 81, temperature 98.8 F (37.1 C), temperature source Oral, resp. rate 18, height 5\' 2"  (1.575 m), weight 60 kg (132 lb 4.4 oz), SpO2 100.00%. Up in chair, conversant. Denies pain. Good strength BLE. Pt states PT pleased with her improvements with gait. Incision without erythema, swelling, or drainage.     Disposition: Discharge to SNF for rehabilitation. Oncology follow-up will be based on pathology (still pending); also follow up in office in 3-4 weeks with Neurosurgery (DrStern).  Oncology consult note copied below:  [place]  DOB: 1940-10-23  MR#: [id number]  CSN#: [id number]    Requesting Physician: Ritta Slot, Neurosurgery.  Primary MD: [name], MD  History of present illness:     Ashley West is a 38 year African American female admitted from her PCP office on 02/22/13  to [facility] with  1 month history of worsening  lower back pain, complicated with bilateral lower extremity weakness and numbness with subsequent gait instability, accompanied by urinary incontinence and constipation. 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 Extradural tumor extends into the spinal canal at T5-6-7 and circumferentially compresses the spinal cord. Patchy tumor seen throughout the spine including the sacrum. There is no pathologic fracture. The most extensive tumors and T12 and S1. No adenopathy was noted. No visible lesions in the liver or kidneys. No ascites.Labs showed hypercalcemia, with Ca levels of 11.6, now normalized. Neurosurgical consultation was obtained. Due to cord compression,  an emergent  thoracic spine decompression  was performed. She had T5-T8 laminectomy and resection of epidural metastasis.Thoracic nerve roots appeared infiltrated with tumor but were preserved. Pathology is currently pending As of 2/14 her neurological status had improved, with increased lower extremitiy strength. She was able to ambulate with assistance as of 2/15.her back pain has resolved. She is to be transferred to inpatient rehab. We hve been kindly requested to see the patient anticipating that she sill need oncological follow up. Palliative medicine is involved in her care.     Past medical history:       Past Medical History  Diagnosis  Date   .  Hypertension     Chronic anemia Alcohol habituation History of hiatal hernia 04/2012 Prior  history of fibroids Diverticulosis  Past surgical history:       Past Surgical History   Procedure  Laterality  Date   .  Abdominal hysterectomy (fibroids 1971)       .  Cataract extraction           Bilateral   .  Cyst excision           Left Elbow, Arm and back   .  Esophagogastroduodenoscopy  N/A  04/20/2012       Procedure: ESOPHAGOGASTRODUODENOSCOPY (EGD);  Surgeon: Cleotis Nipper, MD;  [facility];  Service: Endoscopy;  Laterality: N/A;   .  Laminectomy  N/A  02/22/2013       Procedure: THORACIC LAMINECTOMY FOR TUMOR;  Surgeon: Erline Levine, MD;  Location: [facility] NEURO ORS;  Service: Neurosurgery;  Laterality: N/A;     Medications:  Scheduled Meds: .  [START ON 02/28/2013] influenza vac split quadrivalent PF   0.5 mL  Intramuscular  Tomorrow-1000   .  pantoprazole   40 mg  Oral  Q1200   .  polyethylene glycol   17 g  Oral  Daily   .  senna   1 tablet  Oral  BID   .  sodium chloride   3 mL  Intravenous  Q12H    Continuous Infusions: .  sodium chloride       PRN Meds:.acetaminophen, bisacodyl, hydrALAZINE, morphine injection, ondansetron (ZOFRAN) IV, oxyCODONE-acetaminophen, polyethylene glycol, sodium chloride [id number], bisacodyl, hydrALAZINE, morphine injection, ondansetron (ZOFRAN) IV, oxyCODONE-acetaminophen, polyethylene glycol, sodium chloride  Allergies: No Known Allergies  Family history:      Family History   Problem  Relation  Age of Onset   .  Breast cancer  Sister     .  Cancer  Sister         uterine v/s ovarian    ETOH cirrhosis                                    Brother MI                                                           [place]                                           Social history:  Married. Lives in [place]. Retired Scientist, water quality. Full Code  Health maintenance:                  History   Substance Use Topics   .  Smoking status:  Never Smoker    .  Smokeless tobacco:  Not on file   .  Alcohol Use:  12.6 oz/week        21 Cans of beer per week         Comment: everyday                Colonoscopy:07/18/91  PAP: 1993             Bone density: never             Cholesterol:               MGM: 1993             Up to date with immunizations  XLK:GMWNUU of Systems   Constitutional: Positive for unintentional 10  weight loss in the last 6 months. Negative for fever, chills and malaise/fatigue.   Eyes: Negative for blurred vision and double vision.   Respiratory: Negative for cough, hemoptysis, mild shortness of breath.   Cardiovascular: Positive for chest pain. GI: No nausea vomiting diarrhea. Positive constipation. No change in bowel caliber. No  Melena No   Hematochezia.GERD smptoms GU: No blood in urine. loss of urinary control , now resolved. Musculoskeletal: back pain as above. Now much improved, Neurological As per HPI. No Headache     Physical exam:       Filed Vitals:     02/27/13 1046   BP:  135/51   Pulse:  86   Temp:  98.1 F (36.7 C)   Resp:  20      Body mass index is 24.19 kg/(m^2).    General:  79 -year-old AAF   in no acute distress A. and O. x3  well-developed and well-nourished.   HEENT: Normocephalic, atraumatic, PERRLA. Oral cavity without thrush or lesions. Neck supple. no thyromegaly, no cervical or supraclavicular adenopathy  Lungs clear bilaterally . No wheezing, rhonchi or rales. No axillary nodes Breasts: No masses noted Cardiac regular rate and rhythm, no murmur , rubs or gallops Abdomen soft nontender , bowel sounds x4. No HSM. No masses palpable.  GU/rectal: deferred. Extremities no clubbing, no  cyanosis or edema. No bruising or petechial rash Musculoskeletal: no spinal tenderness.  Neuro:able to move all extremities. As per Neuro.  Lab results:      CBC  Recent Labs Lab  02/22/13 1126  02/22/13 2037  02/24/13 0255  02/25/13 0335   WBC  6.9   --   12.1*  9.9   HGB  12.2  10.2*  9.1*  9.6*   HCT  36.1  30.0*  27.5*  29.8*   PLT  397   --    366  372   MCV  81.7   --   83.8  84.7   MCH  27.6   --   27.7  27.3   MCHC  33.8   --   33.1  32.2   RDW  14.5   --   15.3  15.8*   LYMPHSABS  0.7   --   1.1   --    MONOABS  0.4   --   0.8   --    EOSABS  0.0   --   0.0   --    BASOSABS  0.0   --   0.0   --      Chemistries   Recent Labs Lab  02/22/13 1126  02/22/13 2037  02/24/13 0255  02/25/13 0335   NA  133*  133*  138  137   K  4.3  3.5*  4.3  4.5   CL  92*   --   105  101   CO2  27   --   24  24   GLUCOSE  116*   --   124*  93   BUN  8   --   7  8   CREATININE  0.71   --   0.74  0.87   CALCIUM  11.6*   --   9.8  10.4      Coagulation profile  Recent Labs Lab  02/22/13 1126  02/22/13 1331   INR  1.00  1.02     Studies:     Mr Thoracic Spine W Wo Contrast  02/22/2013   FINDINGS: The patient has diffuse osseous metastases throughout the visualized portion of the spine. Tumor invades the spinal canal at T5, T6, and T7 and circumferentially compresses the thecal sac and spinal cord. There is no cortical destruction of the posterior margins of the vertebra at that level. Tumor does extend into the right paraspinal soft tissues at T6.  There are no discrete lesions in the lungs. There is no visible hilar or mediastinal adenopathy and there is no visible tumor in the liver or spleen.  There is no pathologic fracture in the thoracic spine at this time. 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  After contrast administration there is patchy enhancement of the extensive tumor. There is enhancement of the extradural tumor compressing the spinal cord at the T5-6-7 level. The scout image demonstrates that there is tumor throughout the cervical spine as well.  IMPRESSION: 1. 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 Extradural tumor extends into the spinal canal at T5-6-7 and circumferentially compresses the spinal cord. The appearance is not specific but metastatic breast cancer should be strongly  considered. 2. Critical Value/emergent results were called by telephone at the time of interpretation on 02/22/2013 at 4:25 PM to Dr. Calvert Cantor , who verbally acknowledged these results.   Electronically Signed   By: Rozetta Nunnery M.D.   On: 02/22/2013 16:33   Mr Lumbar Spine W Wo Contrast  02/22/2013  FINDINGS: Tip of the conus is at L2. The distal spinal cord appears normal. The paraspinal soft tissues are normal. There is no adenopathy or visible abdominal mass lesion. The patient does have extensive diverticulosis of the distal colon.  The patient has patchy tumor throughout the visualized portion of the spine including the sacrum. There is no pathologic fracture. The most extensive tumors and T12 and S1. The discs throughout the lumbar spine are essentially normal. There is moderate right facet arthritis at L3-4 and L4-5 and L5-S1.  There is no visible tumor extension into the spinal canal.  After contrast administration there is enhancement around the arthritic facet joints in the lower lumbar spine. The tumor does not enhance on these images.  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 This is of unknown origin. Given the lack of adenopathy and with no visible lesions in the liver or kidneys and no ascites, I suspect this may represent breast cancer although I have seen this appearance with undifferentiated adenocarcinoma of unknown origin.   Electronically Signed   By: Rozetta Nunnery M.D.   On: 02/22/2013 16:42     Assessmnent/Plan:72 y.o. female admitted  with one month history of progressive lower  back pain complicated by lower extremi  incontinence and constipation. MRI of the spine revealed significantthoracic spine involvement of a tumor, requiring emergent decompression on 02/22/13. PAthology is pending. Dr. Humphrey Rolls to proceed with recommendations when path becomes available, as treatment options pend on the primary patient has a strong family history of gyn cancer. Will rule out upon  diagnosis. Addendum to be written.   Thank you for  the referral.   Rondel Jumbo, PA-C 02/27/2013  ATTENDING'S ATTESTATION:  I personally reviewed patient's chart, examined patient myself, formulated the treatment plan as followed.    Spoke to patient in consultation today. Her husband was at work. Went over her history as well as her scans with her. We discussed her pathology reports are not back yet. We do need to know what her final pathology results. She understands that we will make final treatment decisions depending on the path report. She may require radiation therapy as well as chemotherapy. I will followup with the patient in my clinic next week. We will call her with time and date.  Marcy Panning, MD Medical/Oncology [facility] [phone] (beeper) [phone] (Office)  02/27/2013, 8:32 PM      Medication List    ASK your doctor about these medications       b complex vitamins tablet  Take 1 tablet by mouth daily.     diclofenac 75 MG EC tablet  Commonly known as:  VOLTAREN  Take 75 mg by mouth 2 (two) times daily as needed (pain).     losartan-hydrochlorothiazide 50-12.5 MG per tablet  Commonly known as:  HYZAAR  Take 1 tablet by mouth daily.     multivitamin with minerals Tabs tablet  Take 1 tablet by mouth daily.     omeprazole 20 MG capsule  Commonly known as:  PRILOSEC  Take 20 mg by mouth daily.     polyethylene glycol packet  Commonly known as:  MIRALAX / GLYCOLAX  Take 17 g by mouth daily.     potassium gluconate 595 MG Tabs tablet  Take 1,190 mg by mouth. Takes 2 tablets @ 99mg  equivalent each.     ranitidine 150 MG tablet  Commonly known as:  ZANTAC  Take 150 mg by mouth 2 (two) times daily.     Vitamin C 500 MG Chew  Chew 500 mg by mouth daily.     vitamin E 400 UNIT capsule  Generic drug:  vitamin E  Take 400 Units by mouth daily.         Signed: Verdis Prime 02/28/2013, 9:23 AM

## 2013-02-28 NOTE — Progress Notes (Signed)
Subjective: Patient reports "They said they would take me but then said it would be a copay. I wll just go to the nursing home where my insurance will pay"  Objective: Vital signs in last 24 hours: Temp:  [98 F (36.7 C)-98.8 F (37.1 C)] 98.8 F (37.1 C) (02/18 0534) Pulse Rate:  [78-97] 81 (02/18 0534) Resp:  [18-20] 18 (02/18 0534) BP: (135-147)/(51-71) 138/59 mmHg (02/18 0534) SpO2:  [98 %-100 %] 100 % (02/18 0534)  Intake/Output from previous day: 02/17 0701 - 02/18 0700 In: 480 [P.O.:480] Out: -  Intake/Output this shift:    Up in chair, conversant. Denies pain. Good strength BLE. Pt states PT pleased with her improvements with gait.   Lab Results: No results found for this basename: WBC, HGB, HCT, PLT,  in the last 72 hours BMET No results found for this basename: NA, K, CL, CO2, GLUCOSE, BUN, CREATININE, CALCIUM,  in the last 72 hours  Studies/Results: No results found.  Assessment/Plan: Improving   LOS: 6 days  Pt agreeable to SNF placement for rehab when bed available. Pt aware of plan for f/u with Dr. Vertell Limber in 3-4 weeks & Oncology when pathology has resulted. She will f/u with her primary MD as needed as well. Order entered for d/c to SNF per Dr. Vertell Limber.   Verdis Prime 02/28/2013, 9:17 AM

## 2013-03-01 DIAGNOSIS — C8589 Other specified types of non-Hodgkin lymphoma, extranodal and solid organ sites: Principal | ICD-10-CM

## 2013-03-01 MED ORDER — IOHEXOL 300 MG/ML  SOLN
25.0000 mL | INTRAMUSCULAR | Status: AC
  Administered 2013-03-01 (×2): 25 mL via ORAL

## 2013-03-01 NOTE — Progress Notes (Signed)
Subjective: Patient reports "They've got me packed up; just waiting to go"  Objective: Vital signs in last 24 hours: Temp:  [98.1 F (36.7 C)-100 F (37.8 C)] 100 F (37.8 C) (02/19 0822) Pulse Rate:  [68-112] 76 (02/19 0822) Resp:  [18-24] 24 (02/19 0822) BP: (126-159)/(58-80) 135/58 mmHg (02/19 0822) SpO2:  [94 %-100 %] 100 % (02/19 0822)  Intake/Output from previous day: 02/18 0701 - 02/19 0700 In: 120 [P.O.:120] Out: -  Intake/Output this shift:    Alert, conversant, without c/o pain or discomfort. Exam unchanged. Awaiting SNF transport.  Lab Results: No results found for this basename: WBC, HGB, HCT, PLT,  in the last 72 hours BMET No results found for this basename: NA, K, CL, CO2, GLUCOSE, BUN, CREATININE, CALCIUM,  in the last 72 hours  Studies/Results: No results found.  Assessment/Plan: Improving   LOS: 7 days  Per DrStern, d/c to SNF when bed available.   Ashley West 03/01/2013, 9:38 AM

## 2013-03-01 NOTE — Progress Notes (Signed)
The patient was admitted to the hospital in the setting of lower extremity weakness/numbness and back pain. The patient was found to have thoracic tumor diffusely throughout the spine, most prominently T5 to T8 with cord compression. I discussed the case at initial presentation with Dr. Vertell Limber who took the patient urgently to surgery for a thoracic laminectomy. 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 Further workup pending through Dr. Chancy Milroy in medical oncology. Clinically, the patient has done well with some recovery of function. The patient is being transferred to a skilled nursing facility and she currently is being scheduled to see Dr. Tammi Klippel in radiation oncology next week on an outpatient basis. Possible postoperative radiation treatment will be discussed with the patient at that time.

## 2013-03-01 NOTE — Social Work (Signed)
Met with patient today to update her that we are still awaiting insurance auth from [employer] for SNF transfer to [facility]. She is resting and appreciative of update- CSW will advise once auth rec'd for d.c  Eduard Clos, MSW, [place]

## 2013-03-01 NOTE — Progress Notes (Signed)
Agree with PTA.    Jayvin Hurrell, PT [phone]  

## 2013-03-01 NOTE — Consult Note (Signed)
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

## 2013-03-01 NOTE — Progress Notes (Signed)
[place]  [phone] [place] NOTE  Events since  2/17  Noted. She is  to be discharged today to SNF. Soft tissue mass biopsied from the epidural mass on 02/22/2013, case number [id number] Dr. Gari Crown, is consistent with non-Hodgkin's lymphoma, diffuse large cell, high grade, with an immunohistochemical stains showing large atypical lymphoid cells, positive for LCA, CD20, CD 79a, BCL-2, and BCL- 6.only scattered CD30 positive cells were seen.negative for CD10, CD15 and CD 138.admixed minor T-cell component is highlighted with CD3.CD68 highlights the relatively abundant histiocyte component in the background. No new events overnight. Comfortable.    MEDICATIONS: Scheduled Meds: . influenza vac split quadrivalent PF  0.5 mL Intramuscular Tomorrow-1000  . pantoprazole  40 mg Oral Q1200  . polyethylene glycol  17 g Oral Daily  . senna  1 tablet Oral BID  . sodium chloride  3 mL Intravenous Q12H   Continuous Infusions: . sodium chloride     PRN Meds:.acetaminophen, bisacodyl, hydrALAZINE, morphine injection, ondansetron (ZOFRAN) IV, oxyCODONE-acetaminophen, polyethylene glycol, sodium chloride  ALLERGIES:  No Known Allergies   PHYSICAL EXAMINATION:   Filed Vitals:   03/01/13 0822  BP: 135/58  Pulse: 76  Temp: 100 F (37.8 C)  Resp: 24   Filed Weights   02/22/13 1015 02/22/13 2300  Weight: 122 lb (55.339 kg) 132 lb 4.4 oz (60 kg)    73 year old  in no acute distress A. and O. x3 General well-developed and well-nourished  HEENT: Normocephalic, atraumatic, PERRLA. Oral cavity without thrush or lesions. Neck supple. no thyromegaly, no cervical or supraclavicular adenopathy  Lungs clear bilaterally . No wheezing, rhonchi or rales. Cardiac: regular rate and rhythm,no murmur , rubs or gallops Abdomen soft nontender , bowel sounds x4. No HSM Extremities no clubbing cyanosis or edema. No bruising or petechial rash Neuro: non  focal  LABORATORY/RADIOLOGY DATA:   Recent Labs Lab 02/22/13 1126 02/22/13 2037 02/24/13 0255 02/25/13 0335  WBC 6.9  --  12.1* 9.9  HGB 12.2 10.2* 9.1* 9.6*  HCT 36.1 30.0* 27.5* 29.8*  PLT 397  --  366 372  MCV 81.7  --  83.8 84.7  MCH 27.6  --  27.7 27.3  MCHC 33.8  --  33.1 32.2  RDW 14.5  --  15.3 15.8*  LYMPHSABS 0.7  --  1.1  --   MONOABS 0.4  --  0.8  --   EOSABS 0.0  --  0.0  --   BASOSABS 0.0  --  0.0  --     CMP    Recent Labs Lab 02/22/13 1126 02/22/13 2037 02/24/13 0255 02/25/13 0335  NA 133* 133* 138 137  K 4.3 3.5* 4.3 4.5  CL 92*  --  105 101  CO2 27  --  24 24  GLUCOSE 116*  --  124* 93  BUN 8  --  7 8  CREATININE 0.71  --  0.74 0.87  CALCIUM 11.6*  --  9.8 10.4        Component Value Date/Time   BILITOT 0.4 04/19/2012 1345     Recent Labs Lab 02/22/13 1126 02/22/13 1331  INR 1.00 1.02      CBG:  Recent Labs Lab 02/26/13 1126  GLUCAP 95      ASSESSMENT AND PLAN:   44. 73 year old African American female with a new diagnosis of diffuse large B cell lymphoma, high grade. Based on this diagnosis, Dr. Humphrey Rolls is to see the patient later today if  the patient is not discharged from the hospital. Otherwise, an outpatient visit is to be scheduled, the patient is to be contacted. Dr. Humphrey Rolls is to proceed with treatment plans. Addendum to this note is to be written. Dr. Tammi Klippel has seen the patient, and his treatment plans were pending, and the diagnoses were to be available. Will followup of his care plan.   2. Full code  Discharge planning, to skilled nursing facility today  as per admitting team.    Rondel Jumbo, PA-C 03/01/2013, 8:54 AM   ATTENDING'S ATTESTATION:  I personally reviewed patient's chart, examined patient myself, formulated the treatment plan as followed.    Patient is status post thoracic laminectomy for tumor T5-T8.  I met with the patient today. We discussed the final pathology results. She has a diffuse  large B-cell lymphoma which is high-grade. This certainly is a treatable disease. I discussed chemotherapy with her. We certainly will need to stage her further. I have recommended CT scans of the chest abdomen and pelvis. We will also need to get a PET scan performed. Certainly this can be done as an outpatient. I have also recommended having a Port-A-Cath placed. We will have this scheduled by interventional radiology during patient's hospitalization if she states still remains hospitalized.  Patient has been seen by Dr. Tyler Pita and he is planning on radiating the T5-T8 thoracic spine.  I certainly would plan on doing the chemotherapy as an outpatient. She will receive CHOP R. combination chemotherapy every 21 days. This would begin after completion of radiation therapy  Marcy Panning, MD Medical/Oncology [facility] [phone] (beeper) [phone] (Office)  03/01/2013, 6:12 PM

## 2013-03-01 NOTE — Progress Notes (Signed)
PT Cancellation Note  Patient Details Name: Ashley West MRN: [id number] DOB: [birth year]   Cancelled Treatment:    Reason Eval/Treat Not Completed: Patient declined, no reason specified. Patient upset about discharge issues. Patient stated she didn't feel like she could walk at this time. Attempted to provide encouragement but patient stated she wanted to stay in bed. Will follow up as able   [place], Tonia Brooms 03/01/2013, 2:23 PM

## 2013-03-02 ENCOUNTER — Encounter (HOSPITAL_COMMUNITY): Payer: Self-pay | Admitting: Radiology

## 2013-03-02 ENCOUNTER — Inpatient Hospital Stay (HOSPITAL_COMMUNITY): Payer: Medicare HMO

## 2013-03-02 DIAGNOSIS — M6281 Muscle weakness (generalized): Secondary | ICD-10-CM | POA: Diagnosis not present

## 2013-03-02 DIAGNOSIS — C7A098 Malignant carcinoid tumors of other sites: Secondary | ICD-10-CM | POA: Diagnosis not present

## 2013-03-02 DIAGNOSIS — R279 Unspecified lack of coordination: Secondary | ICD-10-CM | POA: Diagnosis not present

## 2013-03-02 DIAGNOSIS — J9600 Acute respiratory failure, unspecified whether with hypoxia or hypercapnia: Secondary | ICD-10-CM | POA: Diagnosis not present

## 2013-03-02 DIAGNOSIS — D649 Anemia, unspecified: Secondary | ICD-10-CM | POA: Diagnosis not present

## 2013-03-02 DIAGNOSIS — I10 Essential (primary) hypertension: Secondary | ICD-10-CM | POA: Diagnosis not present

## 2013-03-02 MED ORDER — IOHEXOL 300 MG/ML  SOLN
80.0000 mL | Freq: Once | INTRAMUSCULAR | Status: AC | PRN
  Administered 2013-03-02: 80 mL via INTRAVENOUS

## 2013-03-02 NOTE — Social Work (Addendum)
Patient for d/c today- confirmed with SNF and patient. Plan transfer via husbands vehicle this pm.  Eduard Clos, MSW, [place]

## 2013-03-02 NOTE — Progress Notes (Signed)
Ashley West  Telephone:([phone]) [place] NOTE  Events since  2/19 Noted. No new events overnight. Comfortable. She has been informed of the B Cell Lymphoma diagnosis. Re staging  CT of the chest, abdomen and pelvis with contrast today without changes compared to films from 2/12. Main finding is the Widespread skeletal metastasis within the vertebral bodies. There is paraspinal thickening along the right aspect of the vertebral from T6 through T10. This is most thick at T6 measuring 8 mm. Discharge to SNF as per admitting team.   MEDICATIONS: Scheduled Meds: . influenza vac split quadrivalent PF  0.5 mL Intramuscular Tomorrow-1000  . pantoprazole  40 mg Oral Q1200  . polyethylene glycol  17 g Oral Daily  . senna  1 tablet Oral BID  . sodium chloride  3 mL Intravenous Q12H   Continuous Infusions: . sodium chloride     PRN Meds:.acetaminophen, bisacodyl, hydrALAZINE, morphine injection, ondansetron (ZOFRAN) IV, oxyCODONE-acetaminophen, polyethylene glycol, sodium chloride  ALLERGIES:  No Known Allergies   PHYSICAL EXAMINATION:   Filed Vitals:   03/02/13 0940  BP: 134/71  Pulse: 77  Temp: 98.1 F (36.7 C)  Resp: 18   Filed Weights   02/22/13 1015 02/22/13 2300  Weight: 122 lb (55.339 kg) 132 lb 4.4 oz (52 kg)    73 year old in no acute distress A. and O. x3  General well-developed and well-nourished  HEENT: Normocephalic, atraumatic, PERRLA. Oral cavity without thrush or lesions.  Neck supple. no thyromegaly, no cervical or supraclavicular adenopathy  Lungs clear bilaterally . No wheezing, rhonchi or rales.  Cardiac: regular rate and rhythm,no murmur , rubs or gallops  Abdomen soft nontender , bowel sounds x4. No HSM  Extremities no clubbing cyanosis or edema. No bruising or petechial rash  Neuro: non focal LABORATORY/RADIOLOGY DATA:   Recent Labs Lab 02/24/13 0255 02/25/13 0335  WBC 12.1* 9.9  HGB 9.1* 9.6*  HCT 27.5* 29.8*    PLT 366 372  MCV 83.8 84.7  MCH 27.7 27.3  MCHC 33.1 32.2  RDW 15.3 15.8*  LYMPHSABS 1.1  --   MONOABS 0.8  --   EOSABS 0.0  --   BASOSABS 0.0  --     CMP    Recent Labs Lab 02/24/13 0255 02/25/13 0335  NA 138 137  K 4.3 4.5  CL 105 101  CO2 24 24  GLUCOSE 124* 93  BUN 7 8  CREATININE 0.74 0.87  CALCIUM 9.8 10.4        Component Value Date/Time   BILITOT 0.4 04/19/2012 1345      CBG:  Recent Labs Lab 02/26/13 1126  GLUCAP 95      ASSESSMENT AND PLAN:   92. 73 year old African American female with a new diagnosis of diffuse large B cell lymphoma, high grade. Re staging CT of the abdomen ad pelvis on 2/20 show no other abnormalities other than known widespread osseous disease.  We will also need to get a PET scan performed as outpatient.Will need Port-A-Cath placed by IR, inpatient if she remains hospitalized, otherwise as outpatient prior to chemo.   She will CHOP R. combination chemotherapy every 21 days after completion of radiation therapy under Dr. Johny Shears guidance.   2. Full code  Discharge planning, to skilled nursing facility today  as per admitting team.    Rondel Jumbo, PA-C 03/02/2013, 9:43 AM  ATTENDING'S ATTESTATION:  I personally reviewed patient's chart, examined patient myself, formulated the treatment plan as followed.  CT scans performed revealed no evidence of disease anywhere else other than the spine. This is encouraging. She will need a PET scan performed as an outpatient for further staging. I plan would be to give her systemic chemotherapy most likely consisting of R. CHOP after she completes radiation therapy.  Patient will eventually need a Port-A-Cath placed. I will plan on having this done as an outpatient.  Marcy Panning, MD Medical/Oncology [facility] [phone] (beeper) [phone] (Office)

## 2013-03-02 NOTE — Discharge Summary (Signed)
Physician Discharge Summary  Patient ID: Ashley West MRN: [id number] DOB/AGE: August 03, 1940 73 y.o.  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 It was elected to take her to surgery for T5-T8 laminectomy and resection of epidural metastasis.    Consults: Oncology, Radiation Oncology  Significant Diagnostic Studies: radiology: X-Ray: Intra-operative  Treatments: surgery:  THORACIC LAMINECTOMY FOR TUMOR (N/A) T 5 - T 8    Discharge Exam: Blood pressure 134/71, pulse 77, temperature 98.1 F (36.7 C), temperature source Oral, resp. rate 18, height 5\' 2"  (1.575 m), weight 60 kg (132 lb 4.4 oz), SpO2 100.00%. Alert, conversant. Frustrated with overnight procedure & subsequent bathroom visits limiting sleep, but understands purpose.  Discussed briefly B Cell Lymphoma & likelyhood of radiation and chemo as tx rather than radiosurgery - but Dr. Vertell Limber, Dr. Humphrey Rolls, & Dr. Tammi Klippel will discuss further.  Incision healing very well, without erythema, swelling, drainage or tenderness. Drsg removed. Dermabond remains. MAEW. Hopeful of SNF today.  HEMATOLOGY ONCOLOGY CONSULTATION   Discharge to SNF for rehabilitation. Oncology follow-up will be based on pathology (still pending); also follow up in office in 3-4 weeks with Neurosurgery (DrStern). Oncology consult note copied  below:  Assessmnent/Plan:72 y.o. female admitted  with one month history of progressive lower  back pain complicated by lower extremi  incontinence and constipation. MRI of the spine revealed significantthoracic spine involvement of a tumor, requiring emergent decompression on 02/22/13. PAthology is pending. Dr. Humphrey Rolls to proceed with recommendations when path becomes available, as treatment options pend on the primary patient has a strong family history of gyn cancer. Will rule out upon diagnosis. Addendum to be written.   Thank you for the referral.   Rondel Jumbo, PA-C 02/27/2013  ATTENDING'S ATTESTATION:  I personally reviewed patient's chart, examined patient myself, formulated the treatment plan as followed.    Spoke to patient in consultation today. Her husband was at work. Went over her history as well as her scans with her. We discussed her pathology reports are not back yet. We do need to know what her final pathology results. She understands that we will make final treatment decisions depending on the path report. She may require radiation therapy as well as chemotherapy. I will followup with the patient in my clinic next week. We will call her with time and date.  Marcy Panning, MD Medical/Oncology [facility] [phone] (beeper) [phone] (Office)  02/27/2013, 8:32 PM      Disposition: Discharge to SNF for rehabilitation. Oncology follow-up as above; also follow up in office in 3-4 weeks with Neurosurgery (DrStern). Oncology consult note copied above.     Future Appointments Provider Department Dept Phone   03/07/2013 2:30 PM [place] [facility] [phone]   03/07/2013 3:00 PM Lora Paula, MD [facility] [phone]       Medication List    ASK your doctor about  these medications       b complex vitamins tablet  Take 1 tablet by mouth daily.     diclofenac 75 MG EC  tablet  Commonly known as:  VOLTAREN  Take 75 mg by mouth 2 (two) times daily as needed (pain).     losartan-hydrochlorothiazide 50-12.5 MG per tablet  Commonly known as:  HYZAAR  Take 1 tablet by mouth daily.     multivitamin with minerals Tabs tablet  Take 1 tablet by mouth daily.     omeprazole 20 MG capsule  Commonly known as:  PRILOSEC  Take 20 mg by mouth daily.     polyethylene glycol packet  Commonly known as:  MIRALAX / GLYCOLAX  Take 17 g by mouth daily.     potassium gluconate 595 MG Tabs tablet  Take 1,190 mg by mouth. Takes 2 tablets @ 99mg  equivalent each.     ranitidine 150 MG tablet  Commonly known as:  ZANTAC  Take 150 mg by mouth 2 (two) times daily.     Vitamin C 500 MG Chew  Chew 500 mg by mouth daily.     vitamin E 400 UNIT capsule  Generic drug:  vitamin E  Take 400 Units by mouth daily.         Signed: Verdis Prime 03/02/2013, 12:43 PM

## 2013-03-02 NOTE — Progress Notes (Signed)
Patient ready for discharge, awaiting on transportation from husband to be conveyed to the facility. Assessments remain unchanged as at now. Report called and given.

## 2013-03-02 NOTE — Progress Notes (Signed)
Subjective: Patient reports "They told me last night about the lymphoma & said it was the better kind to have"  Objective: Vital signs in last 24 hours: Temp:  [97.8 F (36.6 C)-98.2 F (36.8 C)] 98.2 F (36.8 C) (02/20 0659) Pulse Rate:  [74-92] 74 (02/20 0659) Resp:  [18-22] 18 (02/20 0659) BP: (135-155)/(65-73) 135/73 mmHg (02/20 0659) SpO2:  [96 %-100 %] 97 % (02/20 0659)  Intake/Output from previous day:   Intake/Output this shift:    Alert, conversant. Frustrated with overnight procedure & subsequent bathroom visits limiting sleep, but understands purpose.  Discussed briefly B Cell Lymphoma & likelyhood of radiation and chemo as tx rather than radiosurgery - but Dr. Vertell Limber, Dr. Humphrey Rolls, & Dr. Tammi Klippel will discuss further.  Incision healing very well, without erythema, swelling, drainage or tenderness. Drsg removed. Dermabond remains. MAEW. Hopeful of SNF today.  Lab Results: No results found for this basename: WBC, HGB, HCT, PLT,  in the last 72 hours BMET No results found for this basename: NA, K, CL, CO2, GLUCOSE, BUN, CREATININE, CALCIUM,  in the last 72 hours  Studies/Results: Ct Abdomen Pelvis W Contrast  03/02/2013   CLINICAL DATA:  Lymphoma staging. Patient recently underwent the thoracic decompression for metastatic lymphoma invading the epidural space from T5 through T8.  EXAM: CT CHEST, ABDOMEN, AND PELVIS WITH CONTRAST  TECHNIQUE: Multidetector CT imaging of the chest, abdomen and pelvis was performed following the standard protocol during bolus administration of intravenous contrast.  CONTRAST:  27mL OMNIPAQUE IOHEXOL 300 MG/ML  SOLN  COMPARISON:  MRI 02/22/2013  FINDINGS: CT CHEST FINDINGS  No axillary or supraclavicular lymphadenopathy. No mediastinal hilar lymphadenopathy. No pericardial fluid.  There is paraspinal thickening along the right aspect of the vertebral from T6 through T10. This is most thick at T6 measuring 8 mm (image 23, series 2). There is laminectomies  noted from T5 through T8 without complicating features.  Review of the lung parenchyma demonstrates no suspicious nodules. Airways are normal.  CT ABDOMEN AND PELVIS FINDINGS  No focal hepatic lesion. The gallbladder, pancreas, spleen, adrenal glands, and kidneys are normal.  The stomach, small bowel, cecum are normal. There is a large volume stool throughout the title colon. No obstructing lesion identified. Gas in distal rectum.  Number or is normal caliber. There is small sub cm periaortic lymph nodes. No mesenteric adenopathy.  No free fluid the pelvis. The uterus and bladder normal. No pelvic lymphadenopathy.  Review of the lung parenchyma demonstrates some sclerosis of the vertebral bodies. The widespread metastasis present on comparison MRI is much less evident on CT. Marland Kitchen  IMPRESSION: 1. Mild paravertebral thickening on the right from T6 through T10 relates to lymphoma. 2. Posterior decompression from T5 through T8 for lymphoma involvement of the spinal cord. No complicating features. 3. No evidence of lymphadenopathy with the chest, abdomen, or pelvis, 4. Spleen is normal. 5. Large volume stool throughout the colon suggests constipation. No obstructing lesion identified. 6. Widespread skeletal metastasis within the vertebral bodies on comparison MRI of the spine are not well appreciated by CT. 7. Patient may benefit from an outpatient FDG PET-CT scan.   Electronically Signed   By: Suzy Bouchard M.D.   On: 03/02/2013 01:03    Assessment/Plan: Improving   LOS: 8 days  Awaiting SNF placement.   Verdis Prime 03/02/2013, 9:32 AM

## 2013-03-02 NOTE — Social Work (Signed)
Rec'd word from SNF that authorization has been rec'd. Charge Nurse advised and to get in touch with MD for possible d/c to [facility]. Anticipate d/c today- patient updated.  Eduard Clos, MSW, [place]

## 2013-03-02 NOTE — Progress Notes (Signed)
Occupational Therapy Treatment Patient Details Name: Ashley West MRN: [id number] DOB: 1940/11/23 Today's Date: 03/02/2013 Time: 3976-7341 OT Time Calculation (min): 38 min  OT Assessment / Plan / Recommendation  History of present illness Pt is a 73 y.o. female brought in secondary to increased amount of falls and numbness around buttocks and umbilicus region. 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 Pt s/p THORACIC LAMINECTOMY FOR TUMOR (N/A) T 5 - T 8   OT comments  Pt. Was able to perform LE dressing sitting EOB and required Mod A to stand from that surface. Pt. Refused to AMB to bathroom secondary to fear of falling.  Pt. Was able to perform transfer to [facility] at Mod A level. Pt. Plans to d/c to SNF for rehab.   Follow Up Recommendations       Barriers to Discharge       Equipment Recommendations       Recommendations for Other Services    Frequency Min 3X/week   Progress towards OT Goals Progress towards OT goals: Progressing toward goals  Plan Discharge plan remains appropriate    Precautions / Restrictions Precautions Precautions: Fall Restrictions Weight Bearing Restrictions: No   Pertinent Vitals/Pain No c/o    ADL  Lower Body Dressing: Performed;Minimal assistance Where Assessed - Lower Body Dressing: Unsupported sitting;Supported sit to stand Toilet Transfer: Performed;Moderate assistance Toilet Transfer: Patient Percentage: 50% Toilet Transfer Method: Stand pivot Toilet Transfer Equipment: Bedside commode Toileting - Clothing Manipulation and Hygiene: Performed;Minimal assistance Where Assessed - Camera operator Manipulation and Hygiene: Standing    OT Diagnosis:    OT Problem List:   OT Treatment Interventions:     OT Goals(current goals can now be found in the care plan section) Acute Rehab OT Goals Patient Stated Goal:  (go home post rehab)  Visit Information  Last OT Received On: 03/02/13 Assistance Needed: +1 History of  Present Illness: Pt is a 73 y.o. female brought in secondary to increased amount of falls and numbness around buttocks and umbilicus region. 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 Pt s/p THORACIC LAMINECTOMY FOR TUMOR (N/A) T 5 - T 8    Subjective Data      Prior Functioning       Cognition  Cognition Arousal/Alertness: Awake/alert Behavior During Therapy: WFL for tasks assessed/performed Overall Cognitive Status: Within Functional Limits for tasks assessed Area of Impairment: Memory;Safety/judgement Memory: Decreased short-term memory Safety/Judgement: Decreased awareness of deficits    Mobility  Bed Mobility Overal bed mobility: Needs Assistance Bed Mobility: Supine to Sit Rolling: Min assist Sit to supine: Min assist    Exercises      Balance    End of Session OT - End of Session Equipment Utilized During Treatment: Rolling walker Activity Tolerance: Patient tolerated treatment well Patient left: in bed;with call bell/phone within reach;with bed alarm set  [place] 03/02/2013, 10:28 AM

## 2013-03-06 ENCOUNTER — Encounter: Payer: Self-pay | Admitting: Radiation Oncology

## 2013-03-06 ENCOUNTER — Telehealth: Payer: Self-pay | Admitting: Hematology and Oncology

## 2013-03-06 DIAGNOSIS — C8330 Diffuse large B-cell lymphoma, unspecified site: Secondary | ICD-10-CM | POA: Insufficient documentation

## 2013-03-06 NOTE — Telephone Encounter (Signed)
C/D 03/06/13 for appt. 03/07/13 °

## 2013-03-06 NOTE — Telephone Encounter (Signed)
S/W STACEY @ [facility] [phone] AND GAVE NEW PATIENT APPT FOR 02/25 @ 12:30 W/DR. Lamar.

## 2013-03-06 NOTE — Progress Notes (Signed)
Histology and Location of Primary Cancer: diffuse large b cell lyphoma  Sites of Visceral and Bony Metastatic Disease: osseous disease T5-T10  Location(s) of Symptomatic Metastases: thickening seen most at T6  Past/Anticipated chemotherapy by medical oncology, if any: wants a PET, a port placed and combination chemotherapy every 21 days following completion of radiation therapy  Pain on a scale of 0-10 is: reports upper back pain approximately one month ago but, admitted with severe lower back pain     If Spine Met(s), symptoms, if any, include:  Bowel/Bladder retention or incontinence (please describe): urinary incontinence and constipation prior to surgery   Numbness or weakness in extremities (please describe): bilateral leg numbness, weakness, initially waxing and waning, and numbness to buttocks and abdominal wall to about the level of umbilicus prior to surgery   Current Decadron regimen, if applicable:   Ambulatory status? Walker? Wheelchair?:   SAFETY ISSUES:  Prior radiation? NO  Pacemaker/ICD? NO  Possible current pregnancy? NO  Is the patient on methotrexate? NO  Current Complaints / other details:  73 year old female. Married. Emergency laminectomy performed by Vertell Limber 2/12. Discharged to skilled nursing facility.

## 2013-03-07 ENCOUNTER — Ambulatory Visit
Admit: 2013-03-07 | Discharge: 2013-03-07 | Disposition: A | Discharge status: Home / Self Care | Payer: Medicare HMO | Attending: Radiation Oncology | Admitting: Radiation Oncology

## 2013-03-07 ENCOUNTER — Encounter: Payer: Self-pay | Admitting: Hematology and Oncology

## 2013-03-07 ENCOUNTER — Encounter: Payer: Self-pay | Admitting: Radiation Oncology

## 2013-03-07 ENCOUNTER — Ambulatory Visit: Payer: Medicare HMO

## 2013-03-07 ENCOUNTER — Ambulatory Visit (HOSPITAL_BASED_OUTPATIENT_CLINIC_OR_DEPARTMENT_OTHER): Payer: Medicare HMO | Admitting: Hematology and Oncology

## 2013-03-07 VITALS — BP 160/64 | HR 89 | Temp 98.2°F | Resp 18 | Ht 61.0 in | Wt 122.0 lb

## 2013-03-07 VITALS — BP 160/64 | HR 89 | Temp 98.2°F | Resp 18 | Ht 61.0 in

## 2013-03-07 DIAGNOSIS — R29898 Other symptoms and signs involving the musculoskeletal system: Secondary | ICD-10-CM

## 2013-03-07 DIAGNOSIS — C8330 Diffuse large B-cell lymphoma, unspecified site: Secondary | ICD-10-CM

## 2013-03-07 DIAGNOSIS — G959 Disease of spinal cord, unspecified: Secondary | ICD-10-CM | POA: Insufficient documentation

## 2013-03-07 DIAGNOSIS — I10 Essential (primary) hypertension: Secondary | ICD-10-CM | POA: Insufficient documentation

## 2013-03-07 DIAGNOSIS — K219 Gastro-esophageal reflux disease without esophagitis: Secondary | ICD-10-CM | POA: Insufficient documentation

## 2013-03-07 DIAGNOSIS — C8589 Other specified types of non-Hodgkin lymphoma, extranodal and solid organ sites: Secondary | ICD-10-CM | POA: Insufficient documentation

## 2013-03-07 DIAGNOSIS — Z803 Family history of malignant neoplasm of breast: Secondary | ICD-10-CM | POA: Insufficient documentation

## 2013-03-07 DIAGNOSIS — Z79899 Other long term (current) drug therapy: Secondary | ICD-10-CM | POA: Insufficient documentation

## 2013-03-07 HISTORY — DX: Gastro-esophageal reflux disease without esophagitis: K21.9

## 2013-03-07 HISTORY — DX: Diaphragmatic hernia without obstruction or gangrene: K44.9

## 2013-03-07 NOTE — Progress Notes (Signed)
Checked in new patient with no financial issues. She didn't have her insurance card or id.

## 2013-03-07 NOTE — Progress Notes (Addendum)
Patient has not been prescribed decadron. Patient being wheeled in wheelchair. Patient residing at [facility] facility where she is receiving physical therapy. Patient reports that she doesn't plan to return today to the SNF because she wants to go home. Patient reports she doesn't want chemotherapy. States, "I am stage 4 I just want to go home spend my money and die in peace." Reports her PCP was treating her for hiatal hernia when the lymphoma was found. Reports weakness in her legs that requires her to have assistance when ambulating. Denies nausea, vomiting, headache, dizziness, diarrhea, weight loss or night sweats. Denies pain at this time reports taking Voltaren 75 mg bid.

## 2013-03-07 NOTE — Progress Notes (Signed)
See progress note under physician encounter. 

## 2013-03-07 NOTE — Progress Notes (Signed)
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  HISTORY OF PRESENT ILLNESS::Ashley West is a 73 y.o. female who presented with spinal cord compression and lower extremity weakness.  She was found to have tumor compressing the thoracic spinal cord without history of malignancy.  She underwent diagnostic and therapeutic laminectomy with tumor resection.  Pathology shows B-cell lymphoma.  PREVIOUS RADIATION THERAPY: No  PAST MEDICAL HISTORY:  has a past medical history of Hypertension; Diffuse large B cell lymphoma; Hiatal hernia; and GERD (gastroesophageal reflux disease).    PAST SURGICAL HISTORY: Past Surgical History  Procedure Laterality Date  . Abdominal hysterectomy    . Cataract extraction      Bilateral  . Cyst excision      Left Elbow, Arm and back  . Esophagogastroduodenoscopy N/A 04/20/2012    Procedure: ESOPHAGOGASTRODUODENOSCOPY (EGD);  Surgeon: Cleotis Nipper, MD;  Location: [facility] ENDOSCOPY;  Service: Endoscopy;  Laterality: N/A;  . Laminectomy N/A 02/22/2013    Procedure: THORACIC LAMINECTOMY FOR TUMOR;  Surgeon: Erline Levine, MD;  Location: [facility] NEURO ORS;  Service: Neurosurgery;  Laterality: N/A;    FAMILY HISTORY: family history includes Breast cancer in her sister; Cancer in her sister.  SOCIAL HISTORY:  reports that she has never smoked. She has never used smokeless tobacco. She reports that she does not drink alcohol or use illicit drugs.  ALLERGIES: Review of patient's allergies indicates no known allergies.  MEDICATIONS:  Current Outpatient Prescriptions  Medication Sig Dispense Refill  . Ascorbic Acid (VITAMIN C) 500 MG CHEW Chew 500 mg by mouth daily.      Marland Kitchen b complex  vitamins tablet Take 1 tablet by mouth daily.      . diclofenac (VOLTAREN) 75 MG EC tablet Take 75 mg by mouth 2 (two) times daily as needed (pain).      Marland Kitchen losartan-hydrochlorothiazide (HYZAAR) 50-12.5 MG per tablet Take 1 tablet by mouth daily.      . Multiple Vitamin (MULTIVITAMIN WITH MINERALS) TABS Take 1 tablet by mouth daily.      Marland Kitchen omeprazole (PRILOSEC) 20 MG capsule Take 20 mg by mouth daily.      . polyethylene glycol (MIRALAX / GLYCOLAX) packet Take 17 g by mouth daily.      . potassium gluconate 595 MG TABS Take 1,190 mg by mouth. Takes 2 tablets @ 62m equivalent each.      . ranitidine (ZANTAC) 150 MG tablet Take 150 mg by mouth 2 (two) times daily.      . vitamin E (VITAMIN E) 400 UNIT capsule Take 400 Units by mouth daily.       No current facility-administered medications for this encounter.    REVIEW OF SYSTEMS:  A 15 point review of systems is documented in the electronic medical record. This was obtained by the nursing staff. However, I reviewed this with the patient to discuss relevant findings and make appropriate changes.  Pertinent items are noted in HPI.   PHYSICAL EXAM:  height is _0  (1.549 m) and weight is 122 lb (55.339 kg). Her oral temperature is 98.2 F (36.8 C). Her blood pressure is 160/64 and her pulse is 89. Her respiration is 18 and oxygen saturation is 100%.  No exam performed today, patient refused exam.  KPS = 60  100 - Normal; no complaints; no evidence of disease. 90   - Able to carry on normal activity; minor signs or symptoms of disease. 80   - Normal activity with effort; some signs or symptoms of disease. 20   - Cares for self; unable to carry on normal activity or to do active work. 60   - Requires occasional assistance, but is able to care for most of his personal needs. 50   - Requires considerable assistance and frequent medical care. 50   - Disabled; requires special care and assistance. 72   - Severely disabled; hospital admission is  indicated although death not imminent. 54   - Very sick; hospital admission necessary; active supportive treatment necessary. 10   - Moribund; fatal processes progressing rapidly. 0     - Dead  Karnofsky DA, Abelmann [place], Craver LS and Burchenal [facility] [phone]) The use of the nitrogen mustards in the palliative treatment of carcinoma: with particular reference to bronchogenic carcinoma Cancer 1 [id number]  LABORATORY DATA:  Lab Results  Component Value Date   WBC 9.9 02/25/2013   HGB 9.6* 02/25/2013   HCT 29.8* 02/25/2013   MCV 84.7 02/25/2013   PLT 372 02/25/2013   Lab Results  Component Value Date   NA 137 02/25/2013   K 4.5 02/25/2013   CL 101 02/25/2013   CO2 24 02/25/2013   Lab Results  Component Value Date   ALT 14 04/19/2012   AST 18 04/19/2012   ALKPHOS 63 04/19/2012   BILITOT 0.4 04/19/2012     RADIOGRAPHY: Dg Chest 2 View  02/08/2013   CLINICAL DATA:  Pain in the upper back.  Cough.  EXAM: CHEST  2 VIEW  COMPARISON:  Chest x-ray 08/19/2008.  FINDINGS: Small calcified granuloma in the right mid to upper lung is unchanged. Lung volumes are normal. No consolidative airspace disease. No pleural effusions. No pneumothorax. No pulmonary nodule or mass noted. Pulmonary vasculature and the cardiomediastinal silhouette are within normal limits.  IMPRESSION: 1.  No radiographic evidence of acute cardiopulmonary disease.   Electronically Signed   By: Vinnie Langton M.D.   On: 02/08/2013 15:25   Ct Chest W Contrast  03/05/2013   CLINICAL DATA:  Lymphoma staging. Patient recently underwent the thoracic decompression for metastatic lymphoma invading the epidural space from T5 through T8.  EXAM: CT CHEST, ABDOMEN, AND PELVIS WITH CONTRAST  TECHNIQUE: Multidetector CT imaging of the chest, abdomen and pelvis was performed following the standard protocol during bolus administration of intravenous contrast.  CONTRAST:  32m OMNIPAQUE IOHEXOL 300 MG/ML SOLN  COMPARISON:  MRI 02/22/2013  FINDINGS: CT CHEST FINDINGS   No axillary or supraclavicular lymphadenopathy. No mediastinal hilar lymphadenopathy. No pericardial fluid.  There is paraspinal thickening along the right aspect of the vertebral from T6 through T10. This is most thick at T6 measuring 8 mm (image 23, series 2). There is laminectomies noted from T5 through T8 without complicating features.  Review of the lung parenchyma demonstrates no suspicious nodules. Airways are normal.  CT ABDOMEN AND PELVIS FINDINGS  No focal hepatic lesion. The gallbladder, pancreas, spleen, adrenal glands, and kidneys are normal.  The stomach, small bowel, cecum are normal. There is a large volume stool throughout the title colon. No obstructing lesion identified. Gas in distal rectum.  Number or is normal caliber. There is small sub cm periaortic lymph nodes. No mesenteric adenopathy.  No free fluid the pelvis.  The uterus and bladder normal. No pelvic lymphadenopathy.  Review of the lung parenchyma demonstrates some sclerosis of the vertebral bodies. The widespread metastasis present on comparison MRI is much less evident on CT. Marland Kitchen  IMPRESSION: 1. Mild paravertebral thickening on the right from T6 through T10 relates to lymphoma. 2. Posterior decompression from T5 through T8 for lymphoma involvement of the spinal cord. No complicating features. 3. No evidence of lymphadenopathy with the chest, abdomen, or pelvis, Spleen is normal. 4. Large volume stool throughout the colon suggests constipation. No obstructing lesion identified. 5. Widespread skeletal metastasis within the vertebral bodies on comparison MRI of the spine are not well appreciated by CT. 6. Patient may benefit from an outpatient FDG PET-CT scan.   Electronically Signed   By: Suzy Bouchard M.D.   On: 03/05/2013 09:11   Mr Thoracic Spine W Wo Contrast  02/22/2013   CLINICAL DATA:  Progressive body numbness from the nipples down and an increasingly unsteady gait.  EXAM: MRI THORACIC SPINE WITHOUT AND WITH CONTRAST   TECHNIQUE: Multiplanar and multiecho pulse sequences of the thoracic spine were obtained without and with intravenous contrast.  CONTRAST:  62m MULTIHANCE GADOBENATE DIMEGLUMINE 529 MG/ML IV SOLN  COMPARISON:  Chest x-ray dated 02/08/2013  FINDINGS: The patient has diffuse osseous metastases throughout the visualized portion of the spine. Tumor invades the spinal canal at T5, T6, and T7 and circumferentially compresses the thecal sac and spinal cord. There is no cortical destruction of the posterior margins of the vertebra at that level. Tumor does extend into the right paraspinal soft tissues at T6.  There are no discrete lesions in the lungs. There is no visible hilar or mediastinal adenopathy and there is no visible tumor in the liver or spleen.  There is no pathologic fracture in the thoracic spine at this time. 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  After contrast administration there is patchy enhancement of the extensive tumor. There is enhancement of the extradural tumor compressing the spinal cord at the T5-6-7 level. The scout image demonstrates that there is tumor throughout the cervical spine as well.  IMPRESSION: 1. 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 Extradural tumor extends into the spinal canal at T5-6-7 and circumferentially compresses the spinal cord. The appearance is not specific but metastatic breast cancer should be strongly considered. 2. Critical Value/emergent results were called by telephone at the time of interpretation on 02/22/2013 at 4:25 PM to Dr. CCalvert Cantor, who verbally acknowledged these results.   Electronically Signed   By: JRozetta NunneryM.D.   On: 02/22/2013 16:33   Mr Lumbar Spine W Wo Contrast  02/22/2013   CLINICAL DATA:  Increasing numbness from the nipples down over the last few weeks. Increasingly unsteady gait.  EXAM: MRI LUMBAR SPINE WITHOUT AND WITH CONTRAST  TECHNIQUE: Multiplanar and multiecho pulse sequences of the lumbar  spine were obtained without and with intravenous contrast.  CONTRAST:  156mMULTIHANCE GADOBENATE DIMEGLUMINE 529 MG/ML IV SOLN  COMPARISON:  None.  FINDINGS: Tip of the conus is at L2. The distal spinal cord appears normal. The paraspinal soft tissues are normal. There is no adenopathy or visible abdominal mass lesion. The patient does have extensive diverticulosis of the distal colon.  The patient has patchy tumor throughout the visualized portion of the spine including the sacrum. There is no pathologic fracture. The most extensive tumors and T12 and S1. The discs throughout the lumbar spine are essentially normal. There is moderate  right facet arthritis at L3-4 and L4-5 and L5-S1.  There is no visible tumor extension into the spinal canal.  After contrast administration there is enhancement around the arthritic facet joints in the lower lumbar spine. The tumor does not enhance on these images.  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 This is of unknown origin. Given the lack of adenopathy and with no visible lesions in the liver or kidneys and no ascites, I suspect this may represent breast cancer although I have seen this appearance with undifferentiated adenocarcinoma of unknown origin.   Electronically Signed   By: Rozetta Nunnery M.D.   On: 02/22/2013 16:42   Ct Abdomen Pelvis W Contrast  03/02/2013   CLINICAL DATA:  Lymphoma staging. Patient recently underwent the thoracic decompression for metastatic lymphoma invading the epidural space from T5 through T8.  EXAM: CT CHEST, ABDOMEN, AND PELVIS WITH CONTRAST  TECHNIQUE: Multidetector CT imaging of the chest, abdomen and pelvis was performed following the standard protocol during bolus administration of intravenous contrast.  CONTRAST:  6m OMNIPAQUE IOHEXOL 300 MG/ML  SOLN  COMPARISON:  MRI 02/22/2013  FINDINGS: CT CHEST FINDINGS  No axillary or supraclavicular lymphadenopathy. No mediastinal hilar lymphadenopathy. No pericardial fluid.  There  is paraspinal thickening along the right aspect of the vertebral from T6 through T10. This is most thick at T6 measuring 8 mm (image 23, series 2). There is laminectomies noted from T5 through T8 without complicating features.  Review of the lung parenchyma demonstrates no suspicious nodules. Airways are normal.  CT ABDOMEN AND PELVIS FINDINGS  No focal hepatic lesion. The gallbladder, pancreas, spleen, adrenal glands, and kidneys are normal.  The stomach, small bowel, cecum are normal. There is a large volume stool throughout the title colon. No obstructing lesion identified. Gas in distal rectum.  Number or is normal caliber. There is small sub cm periaortic lymph nodes. No mesenteric adenopathy.  No free fluid the pelvis. The uterus and bladder normal. No pelvic lymphadenopathy.  Review of the lung parenchyma demonstrates some sclerosis of the vertebral bodies. The widespread metastasis present on comparison MRI is much less evident on CT. .Marland Kitchen IMPRESSION: 1. Mild paravertebral thickening on the right from T6 through T10 relates to lymphoma. 2. Posterior decompression from T5 through T8 for lymphoma involvement of the spinal cord. No complicating features. 3. No evidence of lymphadenopathy with the chest, abdomen, or pelvis, 4. Spleen is normal. 5. Large volume stool throughout the colon suggests constipation. No obstructing lesion identified. 6. Widespread skeletal metastasis within the vertebral bodies on comparison MRI of the spine are not well appreciated by CT. 7. Patient may benefit from an outpatient FDG PET-CT scan.   Electronically Signed   By: SSuzy BouchardM.D.   On: 03/02/2013 01:03   Dg Thoracic Spine 1 View  02/22/2013   CLINICAL DATA:  73year old female with spinal metastatic disease with cord compression in the thoracic spine. Initial encounter.  EXAM: OPERATIVE THORACIC SPINE 1 VIEW(S)  COMPARISON:  Thoracic MRI 02/22/2013.  FINDINGS: Portable intraoperative prone frontal view of the  thoracic spine at 1950 hrs.  Some thoracic spine anatomy obscured by overlying retractor hardware. Judging from sagittal views on the comparison there are full size ribs at T12, and no ribs at L1. Based on the lowest visible ribs on this intraoperative image, the cephalad most surgical needle/probe is at the T7-T8 level, the middle localizer is at T10-T11, an the most caudal localizer is at T12-L1. This seems to agree with  numbering from the thoracic inlet where the first ribs are visible.  Endotracheal tube tip above the carina. Enteric tube coursing in the midline to the T11 level, tip likely still within the distal thoracic esophagus.  IMPRESSION: 1. Intraoperative localization as above; most cephalad horizontal surgical marker at T7-T8. 2. Enteric tube tip likely in the distal thoracic esophagus, could be advanced for more optimal placement.   Electronically Signed   By: Lars Pinks M.D.   On: 02/22/2013 20:10   Dg Chest Port 1 View  02/24/2013   CLINICAL DATA:  Evaluate for atelectasis.  EXAM: PORTABLE CHEST - 1 VIEW  COMPARISON:  Chest x-ray 02/22/2013.  FINDINGS: Patient has been extubated. Lung volumes are low. No consolidative airspace disease. No significant atelectasis. No pleural effusions. No evidence of pulmonary edema. Heart size is upper limits of normal. Upper mediastinal contours are within normal limits. Skin fold artifact over the lower left hemithorax incidentally noted.  IMPRESSION: 1. Status post extubation. 2. Low lung volumes without radiographic evidence of acute cardiopulmonary disease.   Electronically Signed   By: Vinnie Langton M.D.   On: 02/24/2013 09:56   Dg Chest Port 1 View  02/23/2013   CLINICAL DATA:  Intubated  EXAM: PORTABLE CHEST - 1 VIEW  COMPARISON:  February 08, 2013  FINDINGS: The heart size and mediastinal contours are within normal limits. There is no focal infiltrate, pulmonary edema, or pleural effusion. Endotracheal tube is identified distal tip 3.4 cm from  carina. There is no pneumothorax. The previously noted calcified granuloma is not as well appreciated on this exam. The visualized skeletal structures are stable.  IMPRESSION: Endotracheal tube distal tip 3.4 cm from carina. There is no pneumothorax. No acute cardiopulmonary disease is identified.   Electronically Signed   By: Abelardo Diesel M.D.   On: 02/23/2013 02:28      IMPRESSION: The patient is very nice 73 year old woman status post dorsal laminectomy the thoracic spine for B cell non-Hodgkin's lymphoma. We reviewed her imaging and pathology in our brain tumor and spine tumor conference earlier today. The consensus recommendation was to proceed with consideration for systemic chemotherapy as well as possible radiotherapy. If the patient responds to chemotherapy, localized radiotherapy to the thoracic spine or elsewhere could be reserved for palliation in the future if needed.  PLAN:Today, I talked to the patient and family about the findings and work-up thus far.  We discussed the natural history of disease and general treatment, highlighting the role or radiotherapy in the management.  We discussed the available radiation techniques, and focused on the details of logistics and delivery.  We reviewed the anticipated acute and late sequelae associated with radiation in this setting.  The patient was encouraged to ask questions that I answered to the best of my ability.  I filled out a patient counseling form during our discussion including treatment diagrams.  We retained a copy for our records.  The patient would like to proceed with radiation and will be scheduled for CT simulation.  The patient met with medical oncology earlier today and expressed an initial reluctance to consider chemotherapy. I spent most of my time with her today explaining how lymphoma was different from other solid tumors, and how this difference impacted the treatment as well as the outcomes. Medical oncology spent time with her  on the same topic earlier today. I think hearing these issues explained for a second time in a very consistent manner help the patient and her husband understand that  pursuing chemotherapy up front would likely be the best course of action for her and lead to a more prolonged life with elevated quality of life.  The patient would like to return to medical oncology to further discuss chemotherapy and potentially begin treatment.  I spent 30 minutes minutes face to face with the patient and more than 50% of that time was spent in counseling and/or coordination of care.    ------------------------------------------------  Sheral Apley. Tammi Klippel, M.D.

## 2013-03-08 ENCOUNTER — Telehealth: Payer: Self-pay | Admitting: Hematology and Oncology

## 2013-03-08 ENCOUNTER — Encounter: Payer: Self-pay | Admitting: Hematology and Oncology

## 2013-03-08 ENCOUNTER — Telehealth: Payer: Self-pay | Admitting: *Deleted

## 2013-03-08 DIAGNOSIS — R29898 Other symptoms and signs involving the musculoskeletal system: Secondary | ICD-10-CM

## 2013-03-08 HISTORY — DX: Other symptoms and signs involving the musculoskeletal system: R29.898

## 2013-03-08 NOTE — Progress Notes (Signed)
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 Humphrey Rolls on 02/27/2013 after presentation with bilateral lower extremity weakness and numbness, gait instability, urinary incontinence and constipation. MRI of the thoracic and lumbar spine showed diffuse metastatic disease throughout. CT scan of the chest, abdomen and pelvis show no evidence of abnormal lesions. The patient underwent urgent decompression surgery. Pathology from the tumor came back positive for diffuse large B-cell lymphoma. The patient received high-dose intravenous steroids. She was subsequently discharged to a skilled nursing facility for rehabilitation.  INTERVAL HISTORY: Ashley West 73 y.o. female returns for further followup. She complained of persistent numbness from her lower abdomen down to her legs but overall she is improving daily. She is also getting stronger and is able to take small steps with assistance. Her urinary incontinence and constipation are resolving. She denies any weakness in her upper extremities. She thought she might have lost approximately 10-15 pounds over the past few months.  I have reviewed the past medical history, past surgical history, social history and family history with the patient and they are unchanged from previous note.  ALLERGIES:  has No Known Allergies.  MEDICATIONS:  Current Outpatient Prescriptions  Medication Sig Dispense Refill  . Ascorbic Acid (VITAMIN C) 500 MG CHEW Chew 500 mg by mouth daily.      Marland Kitchen b complex vitamins tablet Take 1 tablet by mouth daily.      . diclofenac (VOLTAREN) 75 MG EC tablet Take 75 mg by mouth 2 (two) times daily as needed (pain).      Marland Kitchen losartan-hydrochlorothiazide (HYZAAR) 50-12.5 MG per tablet Take 1 tablet by mouth daily.      . Multiple Vitamin  (MULTIVITAMIN WITH MINERALS) TABS Take 1 tablet by mouth daily.      Marland Kitchen omeprazole (PRILOSEC) 20 MG capsule Take 20 mg by mouth daily.      . polyethylene glycol (MIRALAX / GLYCOLAX) packet Take 17 g by mouth daily.      . potassium gluconate 595 MG TABS Take 1,190 mg by mouth. Takes 2 tablets @ 99mg  equivalent each.      . ranitidine (ZANTAC) 150 MG tablet Take 150 mg by mouth 2 (two) times daily.      . vitamin E (VITAMIN E) 400 UNIT capsule Take 400 Units by mouth daily.       No current facility-administered medications for this visit.    REVIEW OF SYSTEMS:   Constitutional: Denies fevers, chills  Eyes: Denies blurriness of vision Ears, nose, mouth, throat, and face: Denies mucositis or sore throat Respiratory: Denies cough, dyspnea or wheezes Cardiovascular: Denies palpitation, chest discomfort or lower extremity swelling Gastrointestinal:  Denies nausea, heartburn or change in bowel habits Skin: Denies abnormal skin rashes Lymphatics: Denies new lymphadenopathy or easy bruising Behavioral/Psych: Mood is stable, no new changes  All other systems were reviewed with the patient and are negative.  PHYSICAL EXAMINATION: ECOG PERFORMANCE STATUS: 3 - Symptomatic, >50% confined to bed  Filed Vitals:   03/07/13 1318  BP: 160/64  Pulse: 89  Temp: 98.2 F (36.8 C)  Resp: 18   Filed Weights    GENERAL:alert, no distress and comfortable. She looks thin but not cachectic SKIN: skin color, texture, turgor are normal, no rashes or significant lesions EYES: normal, Conjunctiva are pink and non-injected, sclera clear OROPHARYNX:no  exudate, no erythema and lips, buccal mucosa, and tongue normal  NECK: supple, thyroid normal size, non-tender, without nodularity LYMPH:  no palpable lymphadenopathy in the cervical, axillary or inguinal LUNGS: clear to auscultation and percussion with normal breathing effort HEART: regular rate & rhythm and no murmurs and no lower extremity  edema ABDOMEN:abdomen soft, non-tender and normal bowel sounds Musculoskeletal:no cyanosis of digits and no clubbing  NEURO: alert & oriented x 3 with fluent speech, evidence of mild lower extremity weakness. LABORATORY DATA:  I have reviewed the data as listed    Component Value Date/Time   NA 137 02/25/2013 0335   K 4.5 02/25/2013 0335   CL 101 02/25/2013 0335   CO2 24 02/25/2013 0335   GLUCOSE 93 02/25/2013 0335   BUN 8 02/25/2013 0335   CREATININE 0.87 02/25/2013 0335   CALCIUM 10.4 02/25/2013 0335   PROT 6.4 04/19/2012 1345   ALBUMIN 3.5 04/19/2012 1345   AST 18 04/19/2012 1345   ALT 14 04/19/2012 1345   ALKPHOS 63 04/19/2012 1345   BILITOT 0.4 04/19/2012 1345   GFRNONAA 65* 02/25/2013 0335   GFRAA 76* 02/25/2013 0335    No results found for this basename: SPEP, UPEP,  kappa and lambda light chains    Lab Results  Component Value Date   WBC 9.9 02/25/2013   NEUTROABS 10.2* 02/24/2013   HGB 9.6* 02/25/2013   HCT 29.8* 02/25/2013   MCV 84.7 02/25/2013   PLT 372 02/25/2013      Chemistry      Component Value Date/Time   NA 137 02/25/2013 0335   K 4.5 02/25/2013 0335   CL 101 02/25/2013 0335   CO2 24 02/25/2013 0335   BUN 8 02/25/2013 0335   CREATININE 0.87 02/25/2013 0335      Component Value Date/Time   CALCIUM 10.4 02/25/2013 0335   ALKPHOS 63 04/19/2012 1345   AST 18 04/19/2012 1345   ALT 14 04/19/2012 1345   BILITOT 0.4 04/19/2012 1345     RADIOGRAPHIC STUDIES: I reviewed her imaging studies and agree with the interpretation I have personally reviewed the radiological images as listed and agreed with the findings in the report.  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 Using simple language, I explained to them the meaning of staging. I told her the differences between stage IV hematological disorder versus stage IV solid organ tumor. Even with stage IV  diffuse large B-cell lymphoma, she can still be cured with systemic chemotherapy. I estimated overall survival at 5 years in the region of 50% with high-dose systemic chemotherapy. I briefly discussed with her some of the side effects of the chemotherapy to be expected but the patient did not want to hear further about side effects of treatment. She stated that she has family members who have gone through chemotherapy for stage IV disease and subsequently succumbed to their diseases with extensive side effects. I suggested palliative care and possible palliative radiation treatment. I discussed this case with the radiation oncologist as well about plan of care. Addendum: At the time of dictation, after discussion with the radiation oncologist, the patient seems to be open for further discussion about systemic chemotherapy. I would get the patient back to my office next week for further management. All questions were answered. The patient knows to call the clinic with any problems, questions or concerns. No barriers to learning  was detected. I spent 40 minutes counseling the patient face to face. The total time spent in the appointment was 55 minutes and more than 50% was on counseling and review of test results     [facility], [place], MD 03/08/2013 8:52 AM

## 2013-03-08 NOTE — Telephone Encounter (Signed)
Message copied by Cathlean Cower on Thu Mar 08, 2013  9:10 AM ------      Message from: [facility], [place]: Thu Mar 08, 2013  8:36 AM      Regarding: return visit       Hi, can you please call the patient if she wants to return for another discussion. She will need a 30 minutes appointment            Thanks                   ------

## 2013-03-08 NOTE — Telephone Encounter (Signed)
Called pt to see if she would like to see Dr. Alvy Bimler again to discuss treatment, chemotherapy?   Pt states she would like to discuss chemotherapy again with Dr. Alvy Bimler.  States she has not made up her mind yet but wants to talk some more.   Informed her we will arrange appt for her and a Scheduler will call her with an appt date and time.   She verbalized understanding.

## 2013-03-08 NOTE — Telephone Encounter (Signed)
, °

## 2013-03-12 ENCOUNTER — Ambulatory Visit (HOSPITAL_BASED_OUTPATIENT_CLINIC_OR_DEPARTMENT_OTHER): Payer: Medicare HMO

## 2013-03-12 ENCOUNTER — Telehealth: Payer: Self-pay | Admitting: Hematology and Oncology

## 2013-03-12 ENCOUNTER — Telehealth: Payer: Self-pay | Admitting: *Deleted

## 2013-03-12 ENCOUNTER — Ambulatory Visit (HOSPITAL_BASED_OUTPATIENT_CLINIC_OR_DEPARTMENT_OTHER): Payer: Medicare HMO | Admitting: Hematology and Oncology

## 2013-03-12 ENCOUNTER — Encounter: Payer: Self-pay | Admitting: Hematology and Oncology

## 2013-03-12 VITALS — BP 144/55 | HR 83 | Temp 98.4°F | Resp 17 | Ht 61.0 in | Wt 124.6 lb

## 2013-03-12 DIAGNOSIS — C8589 Other specified types of non-Hodgkin lymphoma, extranodal and solid organ sites: Secondary | ICD-10-CM

## 2013-03-12 DIAGNOSIS — C8330 Diffuse large B-cell lymphoma, unspecified site: Secondary | ICD-10-CM

## 2013-03-12 DIAGNOSIS — K5900 Constipation, unspecified: Secondary | ICD-10-CM

## 2013-03-12 DIAGNOSIS — M549 Dorsalgia, unspecified: Secondary | ICD-10-CM

## 2013-03-12 LAB — COMPREHENSIVE METABOLIC PANEL (CC13)
ALBUMIN: 3.7 g/dL (ref 3.5–5.0)
ALT: 31 U/L (ref 0–55)
ANION GAP: 6 meq/L (ref 3–11)
AST: 23 U/L (ref 5–34)
Alkaline Phosphatase: 105 U/L (ref 40–150)
BUN: 13.5 mg/dL (ref 7.0–26.0)
CHLORIDE: 106 meq/L (ref 98–109)
CO2: 24 meq/L (ref 22–29)
Calcium: 11.4 mg/dL — ABNORMAL HIGH (ref 8.4–10.4)
Creatinine: 0.9 mg/dL (ref 0.6–1.1)
Glucose: 122 mg/dl (ref 70–140)
Potassium: 4 mEq/L (ref 3.5–5.1)
Sodium: 135 mEq/L — ABNORMAL LOW (ref 136–145)
Total Bilirubin: 0.29 mg/dL (ref 0.20–1.20)
Total Protein: 6.9 g/dL (ref 6.4–8.3)

## 2013-03-12 LAB — CBC WITH DIFFERENTIAL/PLATELET
BASO%: 0.7 % (ref 0.0–2.0)
BASOS ABS: 0.1 10*3/uL (ref 0.0–0.1)
EOS%: 1.5 % (ref 0.0–7.0)
Eosinophils Absolute: 0.1 10*3/uL (ref 0.0–0.5)
HEMATOCRIT: 28.7 % — AB (ref 34.8–46.6)
HEMOGLOBIN: 9.3 g/dL — AB (ref 11.6–15.9)
LYMPH#: 0.7 10*3/uL — AB (ref 0.9–3.3)
LYMPH%: 10.1 % — ABNORMAL LOW (ref 14.0–49.7)
MCH: 26.6 pg (ref 25.1–34.0)
MCHC: 32.4 g/dL (ref 31.5–36.0)
MCV: 82.2 fL (ref 79.5–101.0)
MONO#: 0.5 10*3/uL (ref 0.1–0.9)
MONO%: 7.1 % (ref 0.0–14.0)
NEUT%: 80.6 % — ABNORMAL HIGH (ref 38.4–76.8)
NEUTROS ABS: 5.8 10*3/uL (ref 1.5–6.5)
Platelets: 545 10*3/uL — ABNORMAL HIGH (ref 145–400)
RBC: 3.49 10*6/uL — ABNORMAL LOW (ref 3.70–5.45)
RDW: 15.5 % — ABNORMAL HIGH (ref 11.2–14.5)
WBC: 7.2 10*3/uL (ref 3.9–10.3)

## 2013-03-12 LAB — LACTATE DEHYDROGENASE (CC13): LDH: 166 U/L (ref 125–245)

## 2013-03-12 LAB — URIC ACID (CC13): URIC ACID, SERUM: 4.6 mg/dL (ref 2.6–7.4)

## 2013-03-12 MED ORDER — LIDOCAINE-PRILOCAINE 2.5-2.5 % EX CREA: 1.0000 "application " | TOPICAL_CREAM | CUTANEOUS | Status: DC | PRN

## 2013-03-12 MED ORDER — OXYCODONE HCL 10 MG PO TABS: 5.0000 mg | ORAL_TABLET | ORAL | Status: DC | PRN

## 2013-03-12 MED ORDER — PROMETHAZINE HCL 12.5 MG PO TABS: 25.0000 mg | ORAL_TABLET | Freq: Four times a day (QID) | ORAL | Status: DC | PRN

## 2013-03-12 NOTE — Telephone Encounter (Signed)
gv pt appt schedule for march including port placement for 03/20/13. pt aware she is to arrive @ [facility] rad 3/10 @ 7:30am

## 2013-03-12 NOTE — Progress Notes (Signed)
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 Humphrey Rolls on 02/27/2013 after presentation with bilateral lower extremity weakness and numbness, gait instability, urinary incontinence and constipation. MRI of the thoracic and lumbar spine showed diffuse metastatic disease throughout. CT scan of the chest, abdomen and pelvis show no evidence of abnormal lesions. The patient underwent urgent decompression surgery. Pathology from the tumor came back positive for diffuse large B-cell lymphoma. The patient received high-dose intravenous steroids. She was subsequently discharged to a skilled nursing facility for rehabilitation.  INTERVAL HISTORY: Ashley West 73 y.o. female returns for further followup. When I saw her last, the patient did not want systemic chemotherapy. Later, she change her mind returned today for further discussion. She is a complaint of severe back pain, rating it 7/10 pain. Her pain is persistent and is not improved with diclofenac. She was discharged from the skilled nursing facility back to home after her last visit because she does not want to return there. The neurology deficit in her lower extremities is improving. She had difficulties lying on her side because it causes rib pain. She had to lie on her back. She is to have weakness in her lower extremity but they are improving. I have reviewed the past medical history, past surgical history, social history and family history with the patient and they are unchanged from previous note.  ALLERGIES:  has No Known Allergies.  MEDICATIONS:  Current Outpatient Prescriptions  Medication Sig Dispense Refill  . Ascorbic Acid (VITAMIN C) 500 MG CHEW Chew 500 mg by  mouth daily.      Marland Kitchen b complex vitamins tablet Take 1 tablet by mouth daily.      . diclofenac (VOLTAREN) 75 MG EC tablet Take 75 mg by mouth 2 (two) times daily as needed (pain).      Marland Kitchen losartan-hydrochlorothiazide (HYZAAR) 50-12.5 MG per tablet Take 1 tablet by mouth daily.      . Multiple Vitamin (MULTIVITAMIN WITH MINERALS) TABS Take 1 tablet by mouth daily.      Marland Kitchen omeprazole (PRILOSEC) 20 MG capsule Take 20 mg by mouth daily.      . polyethylene glycol (MIRALAX / GLYCOLAX) packet Take 17 g by mouth daily.      . potassium gluconate 595 MG TABS Take 1,190 mg by mouth. Takes 2 tablets @ 99mg  equivalent each.      . ranitidine (ZANTAC) 150 MG tablet Take 150 mg by mouth 2 (two) times daily.      . vitamin E (VITAMIN E) 400 UNIT capsule Take 400 Units by mouth daily.      Marland Kitchen lidocaine-prilocaine (EMLA) cream Apply 1 application topically as needed.  30 g  0  . Oxycodone HCl (ROXICODONE) 10 MG TABS Take 0.5 tablets (5 mg total) by mouth every 4 (four) hours as needed for severe pain.  40 tablet  0  . promethazine (PHENERGAN) 12.5 MG tablet Take 2 tablets (25 mg total) by mouth every 6 (six) hours as needed for nausea.  30 tablet  3   No current facility-administered medications for this visit.    REVIEW OF SYSTEMS:   Constitutional: Denies fevers, chills or abnormal weight loss Eyes: Denies blurriness of vision Ears, nose, mouth, throat, and face: Denies mucositis or  sore throat Respiratory: Denies cough, dyspnea or wheezes Cardiovascular: Denies palpitation, chest discomfort or lower extremity swelling Gastrointestinal:  Denies nausea, heartburn or change in bowel habits Skin: Denies abnormal skin rashes Lymphatics: Denies new lymphadenopathy or easy bruising Neurological:Denies numbness, tingling or new weaknesses Behavioral/Psych: Mood is stable, no new changes  All other systems were reviewed with the patient and are negative.  PHYSICAL EXAMINATION: ECOG PERFORMANCE STATUS: 2 -  Symptomatic, <50% confined to bed  Filed Vitals:   03/12/13 1354  BP: 144/55  Pulse: 83  Temp: 98.4 F (36.9 C)  Resp: 17   Filed Weights   03/12/13 1354  Weight: 124 lb 9.6 oz (56.518 kg)    GENERAL:alert, no distress and comfortable SKIN: skin color, texture, turgor are normal, no rashes or significant lesions Musculoskeletal:no cyanosis of digits and no clubbing  NEURO: alert & oriented x 3 with fluent speech, no focal motor/sensory deficits  LABORATORY DATA:  I have reviewed the data as listed    Component Value Date/Time   NA 135* 03/12/2013 1503   NA 137 02/25/2013 0335   K 4.0 03/12/2013 1503   K 4.5 02/25/2013 0335   CL 101 02/25/2013 0335   CO2 24 03/12/2013 1503   CO2 24 02/25/2013 0335   GLUCOSE 122 03/12/2013 1503   GLUCOSE 93 02/25/2013 0335   BUN 13.5 03/12/2013 1503   BUN 8 02/25/2013 0335   CREATININE 0.9 03/12/2013 1503   CREATININE 0.87 02/25/2013 0335   CALCIUM 11.4* 03/12/2013 1503   CALCIUM 10.4 02/25/2013 0335   PROT 6.9 03/12/2013 1503   PROT 6.4 04/19/2012 1345   ALBUMIN 3.7 03/12/2013 1503   ALBUMIN 3.5 04/19/2012 1345   AST 23 03/12/2013 1503   AST 18 04/19/2012 1345   ALT 31 03/12/2013 1503   ALT 14 04/19/2012 1345   ALKPHOS 105 03/12/2013 1503   ALKPHOS 63 04/19/2012 1345   BILITOT 0.29 03/12/2013 1503   BILITOT 0.4 04/19/2012 1345   GFRNONAA 65* 02/25/2013 0335   GFRAA 76* 02/25/2013 0335    No results found for this basename: SPEP,  UPEP,   kappa and lambda light chains    Lab Results  Component Value Date   WBC 7.2 03/12/2013   NEUTROABS 5.8 03/12/2013   HGB 9.3* 03/12/2013   HCT 28.7* 03/12/2013   MCV 82.2 03/12/2013   PLT 545* 03/12/2013      Chemistry      Component Value Date/Time   NA 135* 03/12/2013 1503   NA 137 02/25/2013 0335   K 4.0 03/12/2013 1503   K 4.5 02/25/2013 0335   CL 101 02/25/2013 0335   CO2 24 03/12/2013 1503   CO2 24 02/25/2013 0335   BUN 13.5 03/12/2013 1503   BUN 8 02/25/2013 0335   CREATININE 0.9 03/12/2013 1503   CREATININE 0.87 02/25/2013 0335       Component Value Date/Time   CALCIUM 11.4* 03/12/2013 1503   CALCIUM 10.4 02/25/2013 0335   ALKPHOS 105 03/12/2013 1503   ALKPHOS 63 04/19/2012 1345   AST 23 03/12/2013 1503   AST 18 04/19/2012 1345   ALT 31 03/12/2013 1503   ALT 14 04/19/2012 1345   BILITOT 0.29 03/12/2013 1503   BILITOT 0.4 04/19/2012 1345     ASSESSMENT & PLAN:  #1 diffuse B-cell lymphoma I will present her case at tomorrow's hematology tumor board. I discussed with her the importance of treating her with systemic chemotherapy. Given her age and comorbidities, I would prefer to treat her  with bendamustine and rituximab. We discussed the role of chemotherapy. The intent is for cure.  We discussed some of the risks, benefits and side-effects of Rituximab, Bendamustine along with monthly IT methotrexate to prevent the risk of CNS relapse.  Some of the short term side-effects included, though not limited to, risk of fatigue, weight loss, tumor lysis syndrome, risk of allergic reactions, pancytopenia, life-threatening infections, need for transfusions of blood products, nausea, vomiting, change in bowel habits, admission to hospital for various reasons, and risks of death.   The patient is aware that the response rates discussed earlier is not guaranteed.    After a long discussion, patient made an informed decision to proceed with the prescribed plan of care and went ahead to sign the consent form today.   Patient education material was dispensed  I recommend the patient to attend chemotherapy education class but she declined. I will proceed to order an Infuse-a-Port to be placed and start her on chemotherapy next week I will place order for anti-emetics as needed as well as EMLA cream. #2 back pain I gave her prescription of oxycodone to take as needed for back pain. I warned her about the risk of constipation #3 chronic constipation Give her prescription I recommend she continue to take MiraLax    Orders Placed This Encounter   Procedures  . IR Fluoro Guide CV Line Right    Power port for chemo    Standing Status: Future     Number of Occurrences:      Standing Expiration Date: 05/13/2014    Order Specific Question:  Reason for exam:    Answer:  port for chemo    Order Specific Question:  Preferred Imaging Location?    Answer:  [facility]  . CBC with Differential    Standing Status: Future     Number of Occurrences: 1     Standing Expiration Date: 03/12/2014  . Comprehensive metabolic panel    Standing Status: Future     Number of Occurrences: 1     Standing Expiration Date: 03/12/2014  . Lactate dehydrogenase    Standing Status: Future     Number of Occurrences: 1     Standing Expiration Date: 03/12/2014  . Uric Acid    Standing Status: Future     Number of Occurrences: 1     Standing Expiration Date: 03/12/2014  . Hepatitis B core antibody, IgM    Standing Status: Future     Number of Occurrences: 1     Standing Expiration Date: 03/12/2014  . Hepatitis B surface antibody    Standing Status: Future     Number of Occurrences: 1     Standing Expiration Date: 03/12/2014  . Hepatitis B surface antigen    Standing Status: Future     Number of Occurrences: 1     Standing Expiration Date: 03/12/2014  . APTT    Standing Status: Future     Number of Occurrences: 1     Standing Expiration Date: 03/12/2014   All questions were answered. The patient knows to call the clinic with any problems, questions or concerns. No barriers to learning was detected. I spent 40 minutes counseling the patient face to face. The total time spent in the appointment was 60 minutes and more than 50% was on counseling and review of test results     [facility], [place], MD 03/12/2013 4:26 PM

## 2013-03-12 NOTE — Patient Instructions (Addendum)
Bendamustine Injection What is this medicine? BENDAMUSTINE (BEN da MUS teen) is a chemotherapy drug. It is used to treat chronic lymphocytic leukemia and non-Hodgkin lymphoma. This medicine may be used for other purposes; ask your health care provider or pharmacist if you have questions. COMMON BRAND NAME(S): Treanda What should I tell my health care provider before I take this medicine? They need to know if you have any of these conditions: -kidney disease -liver disease -an unusual or allergic reaction to bendamustine, mannitol, other medicines, foods, dyes, or preservatives -pregnant or trying to get pregnant -breast-feeding How should I use this medicine? This medicine is for infusion into a vein. It is given by a health care professional in a hospital or clinic setting. Talk to your pediatrician regarding the use of this medicine in children. Special care may be needed. Overdosage: If you think you have taken too much of this medicine contact a poison control center or emergency room at once. NOTE: This medicine is only for you. Do not share this medicine with others. What if I miss a dose? It is important not to miss your dose. Call your doctor or health care professional if you are unable to keep an appointment. What may interact with this medicine? Do not take this medicine with any of the following medications: -clozapine This medicine may also interact with the following medications: -atazanavir -cimetidine -ciprofloxacin -enoxacin -fluvoxamine -medicines for seizures like carbamazepine and phenobarbital -mexiletine -rifampin -tacrine -thiabendazole -zileuton This list may not describe all possible interactions. Give your health care provider a list of all the medicines, herbs, non-prescription drugs, or dietary supplements you use. Also tell them if you smoke, drink alcohol, or use illegal drugs. Some items may interact with your medicine. What should I watch for while  using this medicine? Your condition will be monitored carefully while you are receiving this medicine. This drug may make you feel generally unwell. This is not uncommon, as chemotherapy can affect healthy cells as well as cancer cells. Report any side effects. Continue your course of treatment even though you feel ill unless your doctor tells you to stop. Call your doctor or health care professional for advice if you get a fever, chills or sore throat, or other symptoms of a cold or flu. Do not treat yourself. This drug decreases your body's ability to fight infections. Try to avoid being around people who are sick. This medicine may increase your risk to bruise or bleed. Call your doctor or health care professional if you notice any unusual bleeding. Be careful brushing and flossing your teeth or using a toothpick because you may get an infection or bleed more easily. If you have any dental work done, tell your dentist you are receiving this medicine. Avoid taking products that contain aspirin, acetaminophen, ibuprofen, naproxen, or ketoprofen unless instructed by your doctor. These medicines may hide a fever. Do not become pregnant while taking this medicine. Women should inform their doctor if they wish to become pregnant or think they might be pregnant. There is a potential for serious side effects to an unborn child. Men should inform their doctors if they wish to father a child. This medicine may lower sperm counts. Talk to your health care professional or pharmacist for more information. Do not breast-feed an infant while taking this medicine. What side effects may I notice from receiving this medicine? Side effects that you should report to your doctor or health care professional as soon as possible: -allergic reactions like skin rash,  information. Do not breast-feed an infant while taking this medicine.  What side effects may I notice from receiving this medicine?  Side effects that you should report to your doctor or health care professional as soon as possible:  -allergic reactions like skin rash, itching or hives, swelling of the face, lips, or tongue  -low blood counts - this medicine may decrease the number of white blood cells, red blood cells and  platelets. You may be at increased risk for infections and bleeding.  -signs of infection - fever or chills, cough, sore throat, pain or difficulty passing urine  -signs of decreased platelets or bleeding - bruising, pinpoint red spots on the skin, black, tarry stools, blood in the urine  -signs of decreased red blood cells - unusually weak or tired, fainting spells, lightheadedness  -trouble passing urine or change in the amount of urine  Side effects that usually do not require medical attention (report to your doctor or health care professional if they continue or are bothersome):  -diarrhea  This list may not describe all possible side effects. Call your doctor for medical advice about side effects. You may report side effects to FDA at 1-800-FDA-1088.  Where should I keep my medicine?  This drug is given in a hospital or clinic and will not be stored at home.  NOTE: This sheet is a summary. It may not cover all possible information. If you have questions about this medicine, talk to your doctor, pharmacist, or health care provider.  © 2014, [employer]. (2011-03-26 14:15:47)  Rituximab injection  What is this medicine?  RITUXIMAB (ri TUX i mab) is a monoclonal antibody. This medicine changes the way the body's immune system works. It is used commonly to treat non-Hodgkin's lymphoma and other conditions. In cancer cells, this drug targets a specific protein within cancer cells and stops the cancer cells from growing. It is also used to treat rhuematoid arthritis (RA). In RA, this medicine slow the inflammatory process and help reduce joint pain and swelling. This medicine is often used with other cancer or arthritis medications.  This medicine may be used for other purposes; ask your health care provider or pharmacist if you have questions.  COMMON BRAND NAME(S): Rituxan  What should I tell my health care provider before I take this medicine?  They need to know if you have any of these  conditions:  -blood disorders  -heart disease  -history of hepatitis B  -infection (especially a virus infection such as chickenpox, cold sores, or herpes)  -irregular heartbeat  -kidney disease  -lung or breathing disease, like asthma  -lupus  -an unusual or allergic reaction to rituximab, mouse proteins, other medicines, foods, dyes, or preservatives  -pregnant or trying to get pregnant  -breast-feeding  How should I use this medicine?  This medicine is for infusion into a vein. It is administered in a hospital or clinic by a specially trained health care professional.  A special MedGuide will be given to you by the pharmacist with each prescription and refill. Be sure to read this information carefully each time.  Talk to your pediatrician regarding the use of this medicine in children. This medicine is not approved for use in children.  Overdosage: If you think you have taken too much of this medicine contact a poison control center or emergency room at once.  NOTE: This medicine is only for you. Do not share this medicine with others.  What if I miss a dose?  It   your medicine. What should I watch for while using this medicine? Report any side effects that you notice during your treatment right away, such as changes in your breathing, fever, chills, dizziness or lightheadedness. These effects are more common with the first dose. Visit your prescriber or health care professional for checks on your  progress. You will need to have regular blood work. Report any other side effects. The side effects of this medicine can continue after you finish your treatment. Continue your course of treatment even though you feel ill unless your doctor tells you to stop. Call your doctor or health care professional for advice if you get a fever, chills or sore throat, or other symptoms of a cold or flu. Do not treat yourself. This drug decreases your body's ability to fight infections. Try to avoid being around people who are sick. This medicine may increase your risk to bruise or bleed. Call your doctor or health care professional if you notice any unusual bleeding. Be careful brushing and flossing your teeth or using a toothpick because you may get an infection or bleed more easily. If you have any dental work done, tell your dentist you are receiving this medicine. Avoid taking products that contain aspirin, acetaminophen, ibuprofen, naproxen, or ketoprofen unless instructed by your doctor. These medicines may hide a fever. Do not become pregnant while taking this medicine. Women should inform their doctor if they wish to become pregnant or think they might be pregnant. There is a potential for serious side effects to an unborn child. Talk to your health care professional or pharmacist for more information. Do not breast-feed an infant while taking this medicine. What side effects may I notice from receiving this medicine? Side effects that you should report to your doctor or health care professional as soon as possible: -allergic reactions like skin rash, itching or hives, swelling of the face, lips, or tongue -low blood counts - this medicine may decrease the number of white blood cells, red blood cells and platelets. You may be at increased risk for infections and bleeding. -signs of infection - fever or chills, cough, sore throat, pain or difficulty passing urine -signs of decreased platelets or bleeding -  bruising, pinpoint red spots on the skin, black, tarry stools, blood in the urine -signs of decreased red blood cells - unusually weak or tired, fainting spells, lightheadedness -breathing problems -confused, not responsive -chest pain -fast, irregular heartbeat -feeling faint or lightheaded, falls -mouth sores -redness, blistering, peeling or loosening of the skin, including inside the mouth -stomach pain -swelling of the ankles, feet, or hands -trouble passing urine or change in the amount of urine Side effects that usually do not require medical attention (report to your doctor or other health care professional if they continue or are bothersome): -anxiety -headache -loss of appetite -muscle aches -nausea -night sweats This list may not describe all possible side effects. Call your doctor for medical advice about side effects. You may report side effects to FDA at 1-800-FDA-1088. Where should I keep my medicine? This drug is given in a hospital or clinic and will not be stored at home. NOTE: This sheet is a summary. It may not cover all possible information. If you have questions about this medicine, talk to your doctor, pharmacist, or health care provider.  2014, [employer]. (2007-08-28 14:04:59) Methotrexate injection What is this medicine? METHOTREXATE (METH oh TREX ate) is a chemotherapy drug. This medicine affects cells that are rapidly growing, such as cancer cells  and cells in your mouth and stomach. It is used to treat many cancers and other medical conditions. It is used for leukemias, lymphomas, breast cancer, lung cancer, head and neck cancers, and other cancers. This medicine also works on the immune system and is commonly used to treat psoriasis and rheumatoid arthritis. This medicine may be used for other purposes; ask your health care provider or pharmacist if you have questions. What should I tell my health care provider before I take this medicine? They  need to know if you have any of these conditions: -if you frequently drink alcohol containing drinks -infection (especially a virus infection such as chickenpox, cold sores, or herpes) -immune system problems -kidney disease -liver disease -low blood counts, like platelets, red bloods, or white blood cells -lung disease -recent or ongoing radiation therapy -an unusual or allergic reaction to methotrexate, benzyl alcohol, other medicines, foods, dyes, or preservatives -pregnant or trying to get pregnant -breast-feeding How should I use this medicine? This drug is given as an injection into a muscle or into a vein. It may also be given into the spinal fluid. It is administered in a hospital or clinic by a specially trained health care professional. Talk to your pediatrician regarding the use of this medicine in children. While this drug may be prescribed for selected conditions, precautions do apply. Overdosage: If you think you have taken too much of this medicine contact a poison control center or emergency room at once. NOTE: This medicine is only for you. Do not share this medicine with others. What if I miss a dose? It is important not to miss your dose. Call your doctor or health care professional if you are unable to keep an appointment. What may interact with this medicine? -antibiotics and other medicines for infections -aspirin and aspirin-like medicines including bismuth subsalicylate (Pepto-Bismol) -cisplatin -dapsone -folic acid in supplements or vitamins -mercaptopurine -NSAIDs, medicines for pain and inflammation, like ibuprofen or naproxen -pemetrexed -phenylbutazone -phenytoin -probenecid -pyrimethamine -theophylline -trimetrexate -vaccines This list may not describe all possible interactions. Give your health care provider a list of all the medicines, herbs, non-prescription drugs, or dietary supplements you use. Also tell them if you smoke, drink alcohol, or use  illegal drugs. Some items may interact with your medicine. What should I watch for while using this medicine? Visit your doctor for checks on your progress. You will need to have regular blood checks during your treatment to monitor your blood, liver function, and kidney function. This drug may make you feel generally unwell. This is not uncommon, as chemotherapy can affect healthy cells as well as cancer cells. Report any side effects. Continue your course of treatment even though you feel ill unless your doctor tells you to stop. In some cases, you may be given additional medicines to help with side effects. Follow all directions for their use. Call your doctor or health care professional for advice if you get a fever, chills or sore throat, or other symptoms of a cold or flu. Do not treat yourself. This drug decreases your body's ability to fight infections. Try to avoid being around people who are sick. This medicine may increase your risk to bruise or bleed. Call your doctor or health care professional if you notice any unusual bleeding. Be careful brushing and flossing your teeth or using a toothpick because you may get an infection or bleed more easily. If you have any dental work done, tell your dentist you are receiving this medicine. Avoid taking  products that contain aspirin, acetaminophen, ibuprofen, naproxen, or ketoprofen unless instructed by your doctor. These medicines may hide a fever. This medicine can make you more sensitive to the sun. Keep out of the sun. If you cannot avoid being in the sun, wear protective clothing and use sunscreen. Do not use sun lamps or tanning beds/booths. Do not treat diarrhea with over the counter products. Contact your doctor if you have diarrhea. To protect your kidneys, drink water or other fluids as directed while you are taking this medicine. Do not drink alcohol-containing drinks while taking this medicine. Both alcohol and the medicine may cause  damage to your liver. Men and women must use effective birth control while they are taking this medicine. Do not become pregnant while taking this medicine. Women must continue using effective birth control for 1 full menstrual cycle after stopping this medicine. Tell your doctor right away if you think that you or your partner might be pregnant. There is a potential for serious side effects to an unborn child. Talk to your health care professional or pharmacist for more information. Do not breast-feed an infant while taking this medicine. Men must continue effective birth control for 3 months after stopping this medicine. What side effects may I notice from receiving this medicine? Side effects that you should report to your doctor or health care professional as soon as possible: -allergic reactions like skin rash, itching or hives, swelling of the face, lips, or tongue -low blood counts - this medicine may decrease the number of white blood cells, red blood cells and platelets. You may be at increased risk for infections and bleeding. -signs of infection - fever or chills, cough, sore throat, pain or difficulty passing urine -signs of decreased platelets or bleeding - bruising, pinpoint red spots on the skin, black, tarry stools, blood in the urine -signs of decreased red blood cells - unusually weak or tired, fainting spells, lightheadedness -breathing problems, like a dry cough -changes in vision -confusion, not alert -diarrhea -mouth or throat sores or ulcers -problems with balance, talking, walking -redness, blistering, peeling or loosening of the skin, including inside the mouth -seizures -trouble passing urine or change in the amount of urine -vomiting -yellowing of the eyes or skin Side effects that usually do not require medical attention (report to your doctor or health care professional if they continue or are bothersome): -change in skin color -eye irritation -hair  loss -headache -loss of appetite -nausea -stomach upset This list may not describe all possible side effects. Call your doctor for medical advice about side effects. You may report side effects to FDA at 1-800-FDA-1088. Where should I keep my medicine? This drug is given in a hospital or clinic and will not be stored at home. NOTE: This sheet is a summary. It may not cover all possible information. If you have questions about this medicine, talk to your doctor, pharmacist, or health care provider.  2014, [employer]. (2007-07-06 11:13:24)

## 2013-03-12 NOTE — Telephone Encounter (Signed)
Patient's husband called and was given appts for Wednesday

## 2013-03-13 LAB — HEPATITIS B SURFACE ANTIGEN: Hepatitis B Surface Ag: NEGATIVE

## 2013-03-13 LAB — HEPATITIS B SURFACE ANTIBODY,QUALITATIVE: Hep B S Ab: NEGATIVE

## 2013-03-13 LAB — APTT: aPTT: 39 seconds — ABNORMAL HIGH (ref 24–37)

## 2013-03-13 LAB — HEPATITIS B CORE ANTIBODY, IGM: HEP B C IGM: NONREACTIVE

## 2013-03-15 ENCOUNTER — Other Ambulatory Visit: Payer: Self-pay | Admitting: Radiology

## 2013-03-16 ENCOUNTER — Encounter (HOSPITAL_COMMUNITY): Payer: Self-pay | Admitting: Pharmacy Technician

## 2013-03-20 ENCOUNTER — Inpatient Hospital Stay (HOSPITAL_COMMUNITY): Admission: RE | Admit: 2013-03-20 | Payer: Medicare HMO | Source: Ambulatory Visit

## 2013-03-20 ENCOUNTER — Ambulatory Visit (HOSPITAL_COMMUNITY): Admission: RE | Admit: 2013-03-20 | Payer: Medicare HMO | Source: Ambulatory Visit

## 2013-03-21 ENCOUNTER — Other Ambulatory Visit: Payer: Self-pay | Admitting: Hematology and Oncology

## 2013-03-22 ENCOUNTER — Ambulatory Visit: Payer: Medicare HMO

## 2013-03-23 ENCOUNTER — Ambulatory Visit: Payer: Medicare HMO

## 2013-03-24 ENCOUNTER — Ambulatory Visit: Payer: Medicare HMO

## 2013-03-28 ENCOUNTER — Ambulatory Visit: Payer: Medicare HMO | Admitting: Hematology and Oncology

## 2013-04-15 ENCOUNTER — Encounter (HOSPITAL_COMMUNITY): Payer: Self-pay | Admitting: Internal Medicine

## 2013-04-15 ENCOUNTER — Inpatient Hospital Stay (HOSPITAL_COMMUNITY)
Admission: EM | Admit: 2013-04-15 | Discharge: 2013-04-19 | DRG: 841 | Disposition: A | Discharge status: Skilled Nursing Facility | Payer: Medicare HMO | Attending: Internal Medicine | Admitting: Internal Medicine

## 2013-04-15 ENCOUNTER — Inpatient Hospital Stay (HOSPITAL_COMMUNITY): Payer: Medicare HMO

## 2013-04-15 DIAGNOSIS — G992 Myelopathy in diseases classified elsewhere: Secondary | ICD-10-CM | POA: Diagnosis present

## 2013-04-15 DIAGNOSIS — Z803 Family history of malignant neoplasm of breast: Secondary | ICD-10-CM

## 2013-04-15 DIAGNOSIS — R5381 Other malaise: Secondary | ICD-10-CM | POA: Diagnosis present

## 2013-04-15 DIAGNOSIS — Z79899 Other long term (current) drug therapy: Secondary | ICD-10-CM

## 2013-04-15 DIAGNOSIS — G8220 Paraplegia, unspecified: Secondary | ICD-10-CM | POA: Diagnosis present

## 2013-04-15 DIAGNOSIS — C8589 Other specified types of non-Hodgkin lymphoma, extranodal and solid organ sites: Principal | ICD-10-CM | POA: Diagnosis present

## 2013-04-15 DIAGNOSIS — K5731 Diverticulosis of large intestine without perforation or abscess with bleeding: Secondary | ICD-10-CM

## 2013-04-15 DIAGNOSIS — D649 Anemia, unspecified: Secondary | ICD-10-CM | POA: Diagnosis present

## 2013-04-15 DIAGNOSIS — Z9849 Cataract extraction status, unspecified eye: Secondary | ICD-10-CM

## 2013-04-15 DIAGNOSIS — I10 Essential (primary) hypertension: Secondary | ICD-10-CM | POA: Diagnosis present

## 2013-04-15 DIAGNOSIS — K219 Gastro-esophageal reflux disease without esophagitis: Secondary | ICD-10-CM | POA: Diagnosis present

## 2013-04-15 DIAGNOSIS — J9600 Acute respiratory failure, unspecified whether with hypoxia or hypercapnia: Secondary | ICD-10-CM

## 2013-04-15 DIAGNOSIS — Z515 Encounter for palliative care: Secondary | ICD-10-CM

## 2013-04-15 DIAGNOSIS — F1010 Alcohol abuse, uncomplicated: Secondary | ICD-10-CM | POA: Diagnosis present

## 2013-04-15 DIAGNOSIS — R29898 Other symptoms and signs involving the musculoskeletal system: Secondary | ICD-10-CM | POA: Diagnosis present

## 2013-04-15 DIAGNOSIS — Z66 Do not resuscitate: Secondary | ICD-10-CM | POA: Diagnosis present

## 2013-04-15 DIAGNOSIS — IMO0002 Reserved for concepts with insufficient information to code with codable children: Secondary | ICD-10-CM | POA: Diagnosis present

## 2013-04-15 DIAGNOSIS — R5383 Other fatigue: Secondary | ICD-10-CM

## 2013-04-15 DIAGNOSIS — C8330 Diffuse large B-cell lymphoma, unspecified site: Secondary | ICD-10-CM | POA: Diagnosis present

## 2013-04-15 LAB — URINALYSIS, ROUTINE W REFLEX MICROSCOPIC
BILIRUBIN URINE: NEGATIVE
GLUCOSE, UA: NEGATIVE mg/dL
HGB URINE DIPSTICK: NEGATIVE
Ketones, ur: NEGATIVE mg/dL
Nitrite: NEGATIVE
Protein, ur: NEGATIVE mg/dL
SPECIFIC GRAVITY, URINE: 1.012 (ref 1.005–1.030)
Urobilinogen, UA: 0.2 mg/dL (ref 0.0–1.0)
pH: 7.5 (ref 5.0–8.0)

## 2013-04-15 LAB — CBC WITH DIFFERENTIAL/PLATELET
BASOS ABS: 0 10*3/uL (ref 0.0–0.1)
BASOS PCT: 0 % (ref 0–1)
Eosinophils Absolute: 0.1 10*3/uL (ref 0.0–0.7)
Eosinophils Relative: 1 % (ref 0–5)
HCT: 34.5 % — ABNORMAL LOW (ref 36.0–46.0)
Hemoglobin: 11 g/dL — ABNORMAL LOW (ref 12.0–15.0)
Lymphocytes Relative: 13 % (ref 12–46)
Lymphs Abs: 0.8 10*3/uL (ref 0.7–4.0)
MCH: 26.2 pg (ref 26.0–34.0)
MCHC: 31.9 g/dL (ref 30.0–36.0)
MCV: 82.1 fL (ref 78.0–100.0)
MONO ABS: 0.4 10*3/uL (ref 0.1–1.0)
Monocytes Relative: 6 % (ref 3–12)
NEUTROS PCT: 80 % — AB (ref 43–77)
Neutro Abs: 4.6 10*3/uL (ref 1.7–7.7)
Platelets: 454 10*3/uL — ABNORMAL HIGH (ref 150–400)
RBC: 4.2 MIL/uL (ref 3.87–5.11)
RDW: 15.3 % (ref 11.5–15.5)
WBC: 5.8 10*3/uL (ref 4.0–10.5)

## 2013-04-15 LAB — URINE MICROSCOPIC-ADD ON

## 2013-04-15 LAB — COMPREHENSIVE METABOLIC PANEL
ALBUMIN: 3.7 g/dL (ref 3.5–5.2)
ALT: 26 U/L (ref 0–35)
AST: 19 U/L (ref 0–37)
Alkaline Phosphatase: 102 U/L (ref 39–117)
BILIRUBIN TOTAL: 0.4 mg/dL (ref 0.3–1.2)
BUN: 13 mg/dL (ref 6–23)
CALCIUM: 11.2 mg/dL — AB (ref 8.4–10.5)
CHLORIDE: 98 meq/L (ref 96–112)
CO2: 24 mEq/L (ref 19–32)
CREATININE: 0.8 mg/dL (ref 0.50–1.10)
GFR calc Af Amer: 84 mL/min — ABNORMAL LOW (ref >90)
GFR, EST NON AFRICAN AMERICAN: 72 mL/min — AB (ref >90)
Glucose, Bld: 96 mg/dL (ref 70–99)
Potassium: 3.9 mEq/L (ref 3.7–5.3)
Sodium: 137 mEq/L (ref 137–147)
Total Protein: 6.9 g/dL (ref 6.0–8.3)

## 2013-04-15 MED ORDER — DEXAMETHASONE SODIUM PHOSPHATE 10 MG/ML IJ SOLN
10.0000 mg | Freq: Once | INTRAMUSCULAR | Status: AC
  Administered 2013-04-15: 10 mg via INTRAVENOUS
  Filled 2013-04-15: qty 1

## 2013-04-15 MED ORDER — GADOBENATE DIMEGLUMINE 529 MG/ML IV SOLN
10.0000 mL | Freq: Once | INTRAVENOUS | Status: AC | PRN
  Administered 2013-04-15: 10 mL via INTRAVENOUS

## 2013-04-15 NOTE — H&P (Addendum)
Patient's PCP: [name], MD Patient's Neurosurgeon: Dr. Vertell Limber Patient's Oncologist: Dr. Alvy Bimler  Chief Complaint: Bilateral lower extremity weakness  History of Present Illness: Ashley West is a 73 y.o. 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  Since her diagnosis in February, she is followed Dr. Alvy Bimler, she has been offered chemotherapy and possible radiation, patient has declined chemotherapy.  Since her surgery in February, she has been mobile using a walker at home.  About one and half weeks ago, she started noticing that her legs were weak bilaterally, and she had difficulty walking.  Over the last 3 days, her walking has deteriorated significantly as such her husband had to carry her to the bathroom.  She presented to the emergency department for further evaluation.  MRI of her thoracic spine and lumbar spine showed significant tumor regrowth in the thoracic spinal canal with significant cord compression and abnormal cord signal.  Patient was evaluated by Dr. Ellene Route, neurosurgery, he felt that the patient was not a good candidate for surgery at this time.  Hospitalist service was asked to admit the patient for further care and management.  She denies any recent fevers, chills, nausea, vomiting, chest pain, shortness of breath, abdominal pain, diarrhea, headaches or vision changes.  Patient denies any bowel or bladder incontinence at this time.  Review of Systems: All systems reviewed with the patient and positive as per history of present illness, otherwise all other systems are negative.  Past Medical History  Diagnosis Date  . Hypertension   . Diffuse large B cell lymphoma   . Hiatal hernia   . GERD (gastroesophageal reflux disease)   . Weakness  of both lower extremities 03/08/2013   Past Surgical History  Procedure Laterality Date  . Abdominal hysterectomy    . Cataract extraction      Bilateral  . Cyst excision      Left Elbow, Arm and back  . Esophagogastroduodenoscopy N/A 04/20/2012    Procedure: ESOPHAGOGASTRODUODENOSCOPY (EGD);  Surgeon: Cleotis Nipper, MD;  Location: [facility] ENDOSCOPY;  Service: Endoscopy;  Laterality: N/A;  . Laminectomy N/A 02/22/2013    Procedure: THORACIC LAMINECTOMY FOR TUMOR;  Surgeon: Erline Levine, MD;  Location: [place] NEURO ORS;  Service: Neurosurgery;  Laterality: N/A;   Family History  Problem Relation Age of Onset  . Breast cancer Sister   . Cancer Sister     uterine v/s ovarina?   History   Social History  . Marital Status: Single    Spouse Name: N/A    Number of Children: N/A  . Years of Education: N/A   Occupational History  . Not on file.   Social History Main Topics  . Smoking status: Never Smoker   . Smokeless tobacco: Never Used  . Alcohol Use: No     Comment: 2-3 beers per week  . Drug Use: No  . Sexual Activity: Not Currently   Other Topics Concern  . Not on file   Social History Narrative  . No narrative on file   Allergies: Review of patient's allergies indicates no known allergies.  Home Meds: Prior to Admission medications   Medication Sig Start Date End Date Taking? Authorizing Provider  b complex vitamins tablet Take 1 tablet by mouth daily.   Yes Historical Provider, MD  diclofenac (VOLTAREN) 75 MG EC tablet Take  75 mg by mouth 2 (two) times daily as needed (pain).   Yes Historical Provider, MD  losartan-hydrochlorothiazide (HYZAAR) 50-12.5 MG per tablet Take 1 tablet by mouth every morning.    Yes Historical Provider, MD  Multiple Vitamin (MULTIVITAMIN WITH MINERALS) TABS Take 1 tablet by mouth daily.   Yes Historical Provider, MD  omeprazole (PRILOSEC) 20 MG capsule Take 20 mg by mouth daily.   Yes Historical Provider, MD  polyethylene glycol (MIRALAX /  GLYCOLAX) packet Take 17 g by mouth daily.   Yes Historical Provider, MD  Potassium 99 MG TABS Take 1 tablet by mouth daily.   Yes Historical Provider, MD  promethazine (PHENERGAN) 12.5 MG tablet Take 2 tablets (25 mg total) by mouth every 6 (six) hours as needed for nausea. 03/12/13  Yes Heath Lark, MD  sodium chloride (OCEAN) 0.65 % SOLN nasal spray Place 2-3 sprays into both nostrils daily as needed for congestion.   Yes Historical Provider, MD  vitamin C (ASCORBIC ACID) 500 MG tablet Take 500 mg by mouth daily.   Yes Historical Provider, MD  vitamin E (VITAMIN E) 400 UNIT capsule Take 400 Units by mouth daily.   Yes Historical Provider, MD    Physical Exam: Blood pressure 161/63, pulse 59, temperature 98.3 F (36.8 C), temperature source Oral, resp. rate 22, SpO2 100.00%. General: Awake, Oriented x3, No acute distress. HEENT: EOMI, Moist mucous membranes Neck: Supple CV: S1 and S2 Lungs: Clear to ascultation bilaterally Abdomen: Soft, Nontender, Nondistended, +bowel sounds. Ext: Good pulses. Trace edema. No clubbing or cyanosis noted. Neuro: Cranial Nerves II-XII grossly intact. Has 5/5 motor strength in upper extremities bilaterally. 3/5 motor strength in lower extremities bilaterally with paresthesias bilaterally.  Lab results:  Recent Labs  04/15/13 1312  NA 137  K 3.9  CL 98  CO2 24  GLUCOSE 96  BUN 13  CREATININE 0.80  CALCIUM 11.2*    Recent Labs  04/15/13 1312  AST 19  ALT 26  ALKPHOS 102  BILITOT 0.4  PROT 6.9  ALBUMIN 3.7   No results found for this basename: LIPASE, AMYLASE,  in the last 72 hours  Recent Labs  04/15/13 1312  WBC 5.8  NEUTROABS 4.6  HGB 11.0*  HCT 34.5*  MCV 82.1  PLT 454*   No results found for this basename: CKTOTAL, CKMB, CKMBINDEX, TROPONINI,  in the last 72 hours No components found with this basename: POCBNP,  No results found for this basename: DDIMER,  in the last 72 hours No results found for this basename: HGBA1C,  in  the last 72 hours No results found for this basename: CHOL, HDL, LDLCALC, TRIG, CHOLHDL, LDLDIRECT,  in the last 72 hours No results found for this basename: TSH, T4TOTAL, FREET3, T3FREE, THYROIDAB,  in the last 72 hours No results found for this basename: VITAMINB12, FOLATE, FERRITIN, TIBC, IRON, RETICCTPCT,  in the last 72 hours Imaging results:  Mr Thoracic Spine W Wo Contrast  04/15/2013   CLINICAL DATA:  Status post decompression for intraspinal tumor, representing lymphoma. Increasing inability to walk.  EXAM: MRI THORACIC SPINE WITHOUT AND WITH CONTRAST  TECHNIQUE: Multiplanar and multiecho pulse sequences of the thoracic spine were obtained without and with intravenous contrast.  CONTRAST:  63mL MULTIHANCE GADOBENATE DIMEGLUMINE 529 MG/ML IV SOLN  COMPARISON:  CT CHEST W/CM dated 03/02/2013; MR T SPINE WO/W CM dated 02/22/2013; MR L SPINE WO/W CM dated 02/22/2013  FINDINGS: There has been thoracic decompression from the T5 through T7 levels 02/22/2013. Significant  tumor regrowth within the spinal canal has occurred. There is significant cord compression with abnormal cord signal implying ischemia or infarction. No definite intramedullary tumor. Compression of the spinal cord is greater from the right than left. Some ventral tumor at these levels is also present. A large right and small left pleural effusion is evident, new from prior chest CT. Widespread osseous metastatic disease is seen elsewhere in the thoracic vertebral bodies without frank pathologic compression deformity.  IMPRESSION: Significant tumor regrowth within the spinal canal has occurred since prior thoracic decompression. There is significant cord compression with abnormal cord signal, implying ischemia or infarction.  Findings discussed with ordering provider.   Electronically Signed   By: Rolla Flatten M.D.   On: 04/15/2013 19:53   Mr Lumbar Spine W Wo Contrast  04/15/2013   CLINICAL DATA:  Widespread lymphoma, status post thoracic  decompression now with increasing inability to walk.  EXAM: MRI LUMBAR SPINE WITHOUT AND WITH CONTRAST  TECHNIQUE: Multiplanar and multiecho pulse sequences of the lumbar spine were obtained without and with intravenous contrast.  CONTRAST:  64mL MULTIHANCE GADOBENATE DIMEGLUMINE 529 MG/ML IV SOLN  COMPARISON:  CT ABD/PELVIS W CM dated 03/02/2013; MR L SPINE WO/W CM dated 02/22/2013. Thoracic MRI without and with contrast reported separately.  FINDINGS: There is widespread osseous disease to the lumbar spine. The greatest involvement remains in the S1 vertebral body. No intraspinal tumor is evident. The conus is unaffected. There is no significant disc protrusion. There may be mild retroperitoneal adenopathy but not of a bulky nature. Slight presacral fluid of uncertain significance. Bladder not grossly distended. Osseous disease noted in the iliac bones and lower sacrum as well.  IMPRESSION: Widespread osseous disease to the lumbar spine similar to that noted 02/22/2013. No intraspinal tumor is evident. There is no cauda equina compression.   Electronically Signed   By: Rolla Flatten M.D.   On: 04/15/2013 20:00    Assessment & Plan by Problem: Bilateral lower extremity weakness Due to significant tumor regrowth within the spinal canal and significant cord compression.  Patient discussed options with Dr. Ellene Route, neurosurgery, patient was deemed not a candidate for surgery at this time.  Continue dexamethasone 10 mg IV every 6 hours.  Transfer the patient to [facility], for discussion with her oncologist and radiation oncology for consideration of radiation therapy.  Diffuse large B-cell lymphoma Patient continues to decline chemotherapy at this time.  Amenable to radiation.  Transferred to [facility] for further care and management.  Patient to discuss with her oncologist for further management.  Patient to continue palliative care consult, discussed with Dr. Lovena Le.  Patient's oncologist  will need to be contacted in the morning.  Hypertension Continue antihypertensive medications.  Stable.  Alcohol abuse Counseled on cessation.  Continue home multivitamin and B12.  Generalized weakness Request physical therapy and occupational therapy evaluation in the morning.  Prophylaxis Lovenox.  CODE STATUS DO NOT RESUSCITATE/DO NOT INTUBATE.  This was discussed with patient and husband at bedside.  Disposition Transfer the patient to [facility] long as inpatient to medical bed.  Time spent on admission, talking to the patient, and coordinating care was: 50 mins.  Ashley West A, MD 04/15/2013, 10:32 PM

## 2013-04-15 NOTE — ED Provider Notes (Signed)
Care and assumed from Dr. Stevie Kern.  MRI showed recurrence of tumor with cord compression. Discussed with Dr. Ellene Route and Dr. Reece Levy. Plan is for her to be admitted to [facility] long for consideration of chemotherapy and radiation.  Houston Siren III, MD 04/15/13 2250

## 2013-04-15 NOTE — ED Notes (Signed)
Called to MRI, states patient is next in line.

## 2013-04-15 NOTE — ED Notes (Signed)
[facility] requested by Network engineer.

## 2013-04-15 NOTE — ED Provider Notes (Signed)
CSN: [id number]     Arrival date & time 04/15/13  1244 History   First MD Initiated Contact with Patient 04/15/13 1245     Chief Complaint  Patient presents with  . Numbness     (Consider location/radiation/quality/duration/timing/severity/associated sxs/prior Treatment) HPI 73 year old female who is DNR presents with one-week history of gradual onset paraplegia, with recurrence of symptoms like she had when diagnosed earlier this year with B cell lymphoma; in February she presented with paraplegia with weakness and numbness of her legs with bladder incontinence was found to have spinal cord compression secondary to B cell lymphoma decompressive surgery and refused chemotherapy afterwards, she again refused chemotherapy in March when she was recovering and able to walk again with a walker, the patient had regained her sensation and was able to walk with a walker up until a week ago and she also had return of bladder function, now over the last week she is gradual onset painless return of paraplegia with severe weakness and numbness to both legs from the buttocks distally with no change in bowel or bladder function, she is unable to walk today and for the last few days and has had to be carried, she still has slight movement of her feet and slight sensation in her legs, she is no fever confusion chest pain cough shortness breath abdominal pain vomiting or other concerns. There is no recent trauma. There is no treatment prior to arrival. The patient still refuses chemotherapy and is undecided even if surgery is offered again if she will undergo surgery.  Past Medical History  Diagnosis Date  . Hypertension   . Diffuse large B cell lymphoma   . Hiatal hernia   . GERD (gastroesophageal reflux disease)   . Weakness of both lower extremities 03/08/2013   Past Surgical History  Procedure Laterality Date  . Abdominal hysterectomy    . Cataract extraction      Bilateral  . Cyst excision      Left  Elbow, Arm and back  . Esophagogastroduodenoscopy N/A 04/20/2012    Procedure: ESOPHAGOGASTRODUODENOSCOPY (EGD);  Surgeon: Cleotis Nipper, MD;  Location: [facility] ENDOSCOPY;  Service: Endoscopy;  Laterality: N/A;  . Laminectomy N/A 02/22/2013    Procedure: THORACIC LAMINECTOMY FOR TUMOR;  Surgeon: Erline Levine, MD;  Location: [place] NEURO ORS;  Service: Neurosurgery;  Laterality: N/A;   Family History  Problem Relation Age of Onset  . Breast cancer Sister   . Cancer Sister     uterine v/s ovarina?   History  Substance Use Topics  . Smoking status: Never Smoker   . Smokeless tobacco: Never Used  . Alcohol Use: No     Comment: 2-3 beers per week   OB History   Grav Para Term Preterm Abortions TAB SAB Ect Mult Living                 Review of Systems 10 Systems reviewed and are negative for acute change except as noted in the HPI.   Allergies  Review of patient's allergies indicates no known allergies.  Home Medications   Current Outpatient Rx  Name  Route  Sig  Dispense  Refill  . b complex vitamins tablet   Oral   Take 1 tablet by mouth daily.         . diclofenac (VOLTAREN) 75 MG EC tablet   Oral   Take 75 mg by mouth 2 (two) times daily as needed (pain).         Marland Kitchen  losartan-hydrochlorothiazide (HYZAAR) 50-12.5 MG per tablet   Oral   Take 1 tablet by mouth every morning.          . Multiple Vitamin (MULTIVITAMIN WITH MINERALS) TABS   Oral   Take 1 tablet by mouth daily.         Marland Kitchen omeprazole (PRILOSEC) 20 MG capsule   Oral   Take 20 mg by mouth daily.         . polyethylene glycol (MIRALAX / GLYCOLAX) packet   Oral   Take 17 g by mouth daily.         . Potassium 99 MG TABS   Oral   Take 1 tablet by mouth daily.         . promethazine (PHENERGAN) 12.5 MG tablet   Oral   Take 2 tablets (25 mg total) by mouth every 6 (six) hours as needed for nausea.   30 tablet   3   . sodium chloride (OCEAN) 0.65 % SOLN nasal spray   Each Nare   Place 2-3 sprays  into both nostrils daily as needed for congestion.         . vitamin C (ASCORBIC ACID) 500 MG tablet   Oral   Take 500 mg by mouth daily.         . vitamin E (VITAMIN E) 400 UNIT capsule   Oral   Take 400 Units by mouth daily.         Marland Kitchen dexamethasone (DECADRON) 2 MG tablet   Oral   Take 5 tablets (10 mg total) by mouth 2 (two) times daily with a meal. Take 10 mg every 4 hours   200 tablet   0    BP 149/68  Pulse 67  Temp(Src) 97.9 F (36.6 C) (Oral)  Resp 17  Ht 5' (1.524 m)  Wt 123 lb 7.3 oz (56 kg)  BMI 24.11 kg/m2  SpO2 100% Physical Exam  Nursing note and vitals reviewed. Constitutional:  Awake, alert, nontoxic appearance with baseline speech for patient.  HENT:  Head: Atraumatic.  Mouth/Throat: No oropharyngeal exudate.  Eyes: EOM are normal. Pupils are equal, round, and reactive to light. Right eye exhibits no discharge. Left eye exhibits no discharge.  Neck: Neck supple.  Cardiovascular: Normal rate and regular rhythm.   No murmur heard. Pulmonary/Chest: Effort normal and breath sounds normal. No stridor. No respiratory distress. She has no wheezes. She has no rales. She exhibits no tenderness.  Abdominal: Soft. Bowel sounds are normal. She exhibits no mass. There is no tenderness. There is no rebound.  Genitourinary:  Chaperone present for rectal examination with greatly decreased and perianal sensation but good rectal tone  Musculoskeletal: She exhibits no tenderness.  Baseline ROM, moves extremities with no obvious new focal weakness.  Lymphadenopathy:    She has no cervical adenopathy.  Neurological: She is alert.  Awake, alert, cooperative and aware of situation; motor strength bilaterally 5 out of 5 in her arms and 2/5 in her legs; sensation normal to light touch bilaterally to upper extremities and greatly decreased light touch to both legs below the waist; peripheral visual fields full to confrontation; no facial asymmetry; tongue midline; major  cranial nerves appear intact; no pronator drift, normal finger to nose bilaterally; deep tendon reflexes knee jerk and ankle jerk trace present bilaterally  Skin: No rash noted.  Psychiatric: She has a normal mood and affect.    ED Course  Procedures (including critical care time) Patient / Family / Caregiver  understand and agree with initial ED impression and plan with expectations set for ED visit. Will consult NSurg after MRI. Pt seen by [facility] in ED but request wait until MRI and NSurg input before [facility] involved. MRI pending at 1630. Signed out to Dr. Doy Mince. [place] Reviewed  CBC WITH DIFFERENTIAL - Abnormal; Notable for the following:    Hemoglobin 11.0 (*)    HCT 34.5 (*)    Platelets 454 (*)    Neutrophils Relative % 80 (*)    All other components within normal limits  COMPREHENSIVE METABOLIC PANEL - Abnormal; Notable for the following:    Calcium 11.2 (*)    GFR calc non Af Amer 72 (*)    GFR calc Af Amer 84 (*)    All other components within normal limits  URINALYSIS, ROUTINE W REFLEX MICROSCOPIC - Abnormal; Notable for the following:    Leukocytes, UA TRACE (*)    All other components within normal limits  BASIC METABOLIC PANEL - Abnormal; Notable for the following:    Sodium 136 (*)    Glucose, Bld 192 (*)    Calcium 11.2 (*)    GFR calc non Af Amer 67 (*)    GFR calc Af Amer 78 (*)    All other components within normal limits  CBC - Abnormal; Notable for the following:    Hemoglobin 10.8 (*)    HCT 34.7 (*)    MCH 25.7 (*)    Platelets 466 (*)    All other components within normal limits  CBC WITH DIFFERENTIAL - Abnormal; Notable for the following:    WBC 20.6 (*)    Hemoglobin 11.0 (*)    HCT 34.4 (*)    MCH 25.8 (*)    Platelets 456 (*)    Neutrophils Relative % 92 (*)    Neutro Abs 19.0 (*)    Lymphocytes Relative 4 (*)    All other components within normal limits  URINE MICROSCOPIC-ADD ON  APTT  PROTIME-INR   Imaging  Review No results found.   EKG Interpretation   Date/Time:  Sunday April 15 2013 13:39:44 EDT Ventricular Rate:  62 PR Interval:  146 QRS Duration: 65 QT Interval:  382 QTC Calculation: 388 R Axis:   38 Text Interpretation:  Sinus rhythm Normal ECG No significant change since  last tracing Confirmed by [facility]  MD, Jenny Reichmann ([id number]) on 04/15/2013 1:46:09 PM      MDM   Final diagnoses:  Paraplegia    Anticipate admit after MRI.    Babette Relic, MD 04/19/13 [phone]

## 2013-04-15 NOTE — ED Notes (Signed)
Pt from home via [facility] with c/o increasing weakness in bilateral legs starting a week ago.  Numbness started this past Friday.  Reports being unable to move lower extremities but EMS reports seeing pt being able to move knees limitedly.  Reflexes decreased but intact and good pulses.  Pt has a hx of tumor in spine with previous surgery.  Refused chemo/radiation treatment.  Pt in NAD, A&O.

## 2013-04-15 NOTE — Progress Notes (Addendum)
called by ED to admit Ms Coatney who is a 73 year old female with diffuse B cell lymphoma who was admitted 2 months back for thoracic cord paraplegia and underwent thoracic laminectomy for tumor at T5-T8.  patient discharged to CIR and then home. She was followed by Dr Alvy Bimler as outpt and although initially agreed for chemotherapy she was now refusing for it. patient and family still unclear if chemotherapy would help her despite being told by her oncologist that it may prevent spread of the tumor.  she was initially hesitant for any surgical procedure if MRI showed any cord compression. palliative care consult had seen pt on last hospitalization but patient refused for any palliative care. She reports progressive LE weakness for past 1 week and now unable to ambulate. Also has b/l LE sensory impairment. Denies bowel or urinary incontinence. Denies urinary retention or constipation.  On exam:  vitals stable  HEENT: no pallor, moist mucosa Chest: clear b/l, no added sounds  CVS: NS1&S2, no murmurs, rubs or gallop Abd: soft, NT, ND, BS+ Ext: warm, no edema CNS: AAOX3, 3+/5 power over b/l lower extremities. Plantars equivocal b/l, reflexes 2+, diminished sensations b/l. Rectal tone normal as per EDP.   Patient now wishes to have surgery if there is cord compression and symptoms are treatable with surgery.  Will defer admission to medicine until MRI of the thoracic and lumbar spine obtained. Neurosurgery will be consulted by EDP following MRI results. Based on MRI results, If neurology recommend that  there is a need and benefit for surgical intervention she can be admitted to neurosurgery service.   Discussed with patient , her husband and sister at bedside. Also discussed with EDP Dr Stevie Kern who agrees with the plan.

## 2013-04-15 NOTE — ED Notes (Signed)
Neurosurgery MD at bedside.

## 2013-04-15 NOTE — Consult Note (Addendum)
Reason for Consult: Recurrent tumor in thoracic spine Referring Physician: Dr. Cyndie Mull is an 73 y.o. female.  HPI: Patient is a 73 year old individual who had become weak in her lower extremities in early February. Around mid-February Dr. Erline Levine did an emergency operation which was a laminectomy from T5-T8 to decompress epidural tumor from the region. 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 The patient was advised regarding chemotherapy but she declined. Her legs get stronger after surgery she was able to ambulate until the past week's time. Since she's noted progressive weakness in her legs and she has not been able to bear weight or walk independently in last few days. She presented to the emergency room room today and an MRI of the thoracic spine reveals the presence of recurrent tumor in the laminectomy bed from T5-T8.  Past Medical History  Diagnosis Date  . Hypertension   . Diffuse large B cell lymphoma   . Hiatal hernia   . GERD (gastroesophageal reflux disease)   . Weakness of both lower extremities 03/08/2013    Past Surgical History  Procedure Laterality Date  . Abdominal hysterectomy    . Cataract extraction      Bilateral  . Cyst excision      Left Elbow, Arm and back  . Esophagogastroduodenoscopy N/A 04/20/2012    Procedure: ESOPHAGOGASTRODUODENOSCOPY (EGD);  Surgeon: Cleotis Nipper, MD;  Location: [facility] ENDOSCOPY;  Service: Endoscopy;  Laterality: N/A;  . Laminectomy N/A 02/22/2013    Procedure: THORACIC LAMINECTOMY FOR TUMOR;  Surgeon: Erline Levine, MD;  Location: [facility] NEURO ORS;  Service: Neurosurgery;  Laterality: N/A;    Family History  Problem Relation Age of Onset  . Breast cancer Sister   . Cancer Sister     uterine v/s ovarina?    Social History:  reports that she has never smoked. She has never used smokeless tobacco. She reports that she does not drink alcohol or use illicit drugs.  Allergies: No Known Allergies  Medications: I have  reviewed the patient's current medications.  Results for orders placed during the hospital encounter of 04/15/13 (from the past 48 hour(s))  CBC WITH DIFFERENTIAL     Status: Abnormal   Collection Time    04/15/13  1:12 PM      Result Value Ref Range   WBC 5.8  4.0 - 10.5 K/uL   RBC 4.20  3.87 - 5.11 MIL/uL   Hemoglobin 11.0 (*) 12.0 - 15.0 g/dL   HCT 34.5 (*) 36.0 - 46.0 %   MCV 82.1  78.0 - 100.0 fL   MCH 26.2  26.0 - 34.0 pg   MCHC 31.9  30.0 - 36.0 g/dL   RDW 15.3  11.5 - 15.5 %   Platelets 454 (*) 150 - 400 K/uL   Neutrophils Relative % 80 (*) 43 - 77 %   Neutro Abs 4.6  1.7 - 7.7 K/uL   Lymphocytes Relative 13  12 - 46 %   Lymphs Abs 0.8  0.7 - 4.0 K/uL   Monocytes Relative 6  3 - 12 %   Monocytes Absolute 0.4  0.1 - 1.0 K/uL   Eosinophils Relative 1  0 - 5 %   Eosinophils Absolute 0.1  0.0 - 0.7 K/uL   Basophils Relative 0  0 - 1 %   Basophils Absolute 0.0  0.0 - 0.1 K/uL  COMPREHENSIVE METABOLIC PANEL     Status: Abnormal   Collection Time    04/15/13  1:12 PM      Result Value Ref Range   Sodium 137  137 - 147 mEq/L   Potassium 3.9  3.7 - 5.3 mEq/L   Chloride 98  96 - 112 mEq/L   CO2 24  19 - 32 mEq/L   Glucose, Bld 96  70 - 99 mg/dL   BUN 13  6 - 23 mg/dL   Creatinine, Ser 0.80  0.50 - 1.10 mg/dL   Calcium 11.2 (*) 8.4 - 10.5 mg/dL   Total Protein 6.9  6.0 - 8.3 g/dL   Albumin 3.7  3.5 - 5.2 g/dL   AST 19  0 - 37 U/L   ALT 26  0 - 35 U/L   Alkaline Phosphatase 102  39 - 117 U/L   Total Bilirubin 0.4  0.3 - 1.2 mg/dL   GFR calc non Af Amer 72 (*) >90 mL/min   GFR calc Af Amer 84 (*) >90 mL/min   Comment: (NOTE)     The eGFR has been calculated using the CKD EPI equation.     This calculation has not been validated in all clinical situations.     eGFR's persistently <90 mL/min signify possible Chronic Kidney     Disease.  URINALYSIS, ROUTINE W REFLEX MICROSCOPIC     Status: Abnormal   Collection Time    04/15/13  1:38 PM      Result Value Ref Range    Color, Urine YELLOW  YELLOW   APPearance CLEAR  CLEAR   Specific Gravity, Urine 1.012  1.005 - 1.030   pH 7.5  5.0 - 8.0   Glucose, UA NEGATIVE  NEGATIVE mg/dL   Hgb urine dipstick NEGATIVE  NEGATIVE   Bilirubin Urine NEGATIVE  NEGATIVE   Ketones, ur NEGATIVE  NEGATIVE mg/dL   Protein, ur NEGATIVE  NEGATIVE mg/dL   Urobilinogen, UA 0.2  0.0 - 1.0 mg/dL   Nitrite NEGATIVE  NEGATIVE   Leukocytes, UA TRACE (*) NEGATIVE  URINE MICROSCOPIC-ADD ON     Status: None   Collection Time    04/15/13  1:38 PM      Result Value Ref Range   Squamous Epithelial / LPF RARE  RARE   WBC, UA 0-2  <3 WBC/hpf    Mr Thoracic Spine W Wo Contrast  04/15/2013   CLINICAL DATA:  Status post decompression for intraspinal tumor, representing lymphoma. Increasing inability to walk.  EXAM: MRI THORACIC SPINE WITHOUT AND WITH CONTRAST  TECHNIQUE: Multiplanar and multiecho pulse sequences of the thoracic spine were obtained without and with intravenous contrast.  CONTRAST:  76m MULTIHANCE GADOBENATE DIMEGLUMINE 529 MG/ML IV SOLN  COMPARISON:  CT CHEST W/CM dated 03/02/2013; MR T SPINE WO/W CM dated 02/22/2013; MR L SPINE WO/W CM dated 02/22/2013  FINDINGS: There has been thoracic decompression from the T5 through T7 levels 02/22/2013. Significant tumor regrowth within the spinal canal has occurred. There is significant cord compression with abnormal cord signal implying ischemia or infarction. No definite intramedullary tumor. Compression of the spinal cord is greater from the right than left. Some ventral tumor at these levels is also present. A large right and small left pleural effusion is evident, new from prior chest CT. Widespread osseous metastatic disease is seen elsewhere in the thoracic vertebral bodies without frank pathologic compression deformity.  IMPRESSION: Significant tumor regrowth within the spinal canal has occurred since prior thoracic decompression. There is significant cord compression with abnormal cord  signal, implying ischemia or infarction.  Findings discussed with ordering  provider.   Electronically Signed   By: Rolla Flatten M.D.   On: 04/15/2013 19:53   Mr Lumbar Spine W Wo Contrast  04/15/2013   CLINICAL DATA:  Widespread lymphoma, status post thoracic decompression now with increasing inability to walk.  EXAM: MRI LUMBAR SPINE WITHOUT AND WITH CONTRAST  TECHNIQUE: Multiplanar and multiecho pulse sequences of the lumbar spine were obtained without and with intravenous contrast.  CONTRAST:  25m MULTIHANCE GADOBENATE DIMEGLUMINE 529 MG/ML IV SOLN  COMPARISON:  CT ABD/PELVIS W CM dated 03/02/2013; MR L SPINE WO/W CM dated 02/22/2013. Thoracic MRI without and with contrast reported separately.  FINDINGS: There is widespread osseous disease to the lumbar spine. The greatest involvement remains in the S1 vertebral body. No intraspinal tumor is evident. The conus is unaffected. There is no significant disc protrusion. There may be mild retroperitoneal adenopathy but not of a bulky nature. Slight presacral fluid of uncertain significance. Bladder not grossly distended. Osseous disease noted in the iliac bones and lower sacrum as well.  IMPRESSION: Widespread osseous disease to the lumbar spine similar to that noted 02/22/2013. No intraspinal tumor is evident. There is no cauda equina compression.   Electronically Signed   By: JRolla FlattenM.D.   On: 04/15/2013 20:00    Review of Systems  HENT: Negative.   Eyes: Negative.   Respiratory: Negative.   Cardiovascular: Negative.   Gastrointestinal: Negative.   Genitourinary: Negative.   Musculoskeletal: Positive for back pain.  Skin: Negative.   Neurological: Positive for weakness.       Decreased ability to walk  Endo/Heme/Allergies: Negative.   Psychiatric/Behavioral: Negative.    Blood pressure 177/71, pulse 63, temperature 98.3 F (36.8 C), temperature source Oral, resp. rate 22, SpO2 100.00%. Physical Exam  Musculoskeletal:  Healed midline scar  in back  Neurological:  Alert and oriented. Lower extremity strength reveals 3/5 strength in iliopsoas 2/5  quad 2/5 strength in tibialis anterior and gastrocs sensation appears intact. Upper extremity strength appears intact.  Skin: Skin is warm and dry.  Psychiatric: She has a normal mood and affect. Her behavior is normal. Judgment and thought content normal.    Assessment/Plan: And discussed the findings with the patient. I noted that unless she is determined to have some adjuvant therapy such as chemotherapy and/or radiation if it's advisable then there is no point in considering further surgical intervention. Again will only be temporary and short-lived. The patient will be seen by oncology to discuss whether she would want to proceed her consider chemotherapy and whether radiation should be offered. The from a surgical standpoint I don't feel that we have anything to offer this lady at this time. Please contact [place] further surgical intervention is felt to be required Aldin Drees J 04/15/2013, 11:04 PM

## 2013-04-16 ENCOUNTER — Ambulatory Visit
Admit: 2013-04-16 | Discharge: 2013-04-16 | Disposition: A | Discharge status: Home / Self Care | Payer: Medicare HMO | Attending: Radiation Oncology | Admitting: Radiation Oncology

## 2013-04-16 DIAGNOSIS — C8589 Other specified types of non-Hodgkin lymphoma, extranodal and solid organ sites: Principal | ICD-10-CM

## 2013-04-16 DIAGNOSIS — R29898 Other symptoms and signs involving the musculoskeletal system: Secondary | ICD-10-CM

## 2013-04-16 DIAGNOSIS — C8330 Diffuse large B-cell lymphoma, unspecified site: Secondary | ICD-10-CM

## 2013-04-16 DIAGNOSIS — D649 Anemia, unspecified: Secondary | ICD-10-CM

## 2013-04-16 DIAGNOSIS — F1010 Alcohol abuse, uncomplicated: Secondary | ICD-10-CM

## 2013-04-16 DIAGNOSIS — Z510 Encounter for antineoplastic radiation therapy: Secondary | ICD-10-CM | POA: Insufficient documentation

## 2013-04-16 DIAGNOSIS — Z515 Encounter for palliative care: Secondary | ICD-10-CM

## 2013-04-16 DIAGNOSIS — G8220 Paraplegia, unspecified: Secondary | ICD-10-CM

## 2013-04-16 DIAGNOSIS — I10 Essential (primary) hypertension: Secondary | ICD-10-CM

## 2013-04-16 LAB — BASIC METABOLIC PANEL
BUN: 16 mg/dL (ref 6–23)
CO2: 23 mEq/L (ref 19–32)
CREATININE: 0.85 mg/dL (ref 0.50–1.10)
Calcium: 11.2 mg/dL — ABNORMAL HIGH (ref 8.4–10.5)
Chloride: 102 mEq/L (ref 96–112)
GFR calc Af Amer: 78 mL/min — ABNORMAL LOW (ref >90)
GFR calc non Af Amer: 67 mL/min — ABNORMAL LOW (ref >90)
GLUCOSE: 192 mg/dL — AB (ref 70–99)
POTASSIUM: 4.4 meq/L (ref 3.7–5.3)
Sodium: 136 mEq/L — ABNORMAL LOW (ref 137–147)

## 2013-04-16 LAB — CBC
HEMATOCRIT: 34.7 % — AB (ref 36.0–46.0)
HEMOGLOBIN: 10.8 g/dL — AB (ref 12.0–15.0)
MCH: 25.7 pg — AB (ref 26.0–34.0)
MCHC: 31.1 g/dL (ref 30.0–36.0)
MCV: 82.6 fL (ref 78.0–100.0)
Platelets: 466 10*3/uL — ABNORMAL HIGH (ref 150–400)
RBC: 4.2 MIL/uL (ref 3.87–5.11)
RDW: 15.3 % (ref 11.5–15.5)
WBC: 8.3 10*3/uL (ref 4.0–10.5)

## 2013-04-16 MED ORDER — ONDANSETRON HCL 4 MG PO TABS: 4.0000 mg | ORAL_TABLET | Freq: Four times a day (QID) | ORAL | Status: DC | PRN

## 2013-04-16 MED ORDER — VITAMIN C 500 MG PO TABS
500.0000 mg | ORAL_TABLET | Freq: Every day | ORAL | Status: DC
  Administered 2013-04-16 – 2013-04-19 (×4): 500 mg via ORAL
  Filled 2013-04-16 (×4): qty 1

## 2013-04-16 MED ORDER — FA-PYRIDOXINE-CYANOCOBALAMIN 2.5-25-2 MG PO TABS
1.0000 | ORAL_TABLET | Freq: Every day | ORAL | Status: DC
  Administered 2013-04-16 – 2013-04-19 (×4): 1 via ORAL
  Filled 2013-04-16 (×4): qty 1

## 2013-04-16 MED ORDER — ONDANSETRON HCL 4 MG/2ML IJ SOLN: 4.0000 mg | Freq: Four times a day (QID) | INTRAMUSCULAR | Status: DC | PRN

## 2013-04-16 MED ORDER — ACETAMINOPHEN 650 MG RE SUPP: 650.0000 mg | Freq: Four times a day (QID) | RECTAL | Status: DC | PRN

## 2013-04-16 MED ORDER — LOSARTAN POTASSIUM 50 MG PO TABS
50.0000 mg | ORAL_TABLET | Freq: Every day | ORAL | Status: DC
  Administered 2013-04-16 – 2013-04-19 (×4): 50 mg via ORAL
  Filled 2013-04-16 (×4): qty 1

## 2013-04-16 MED ORDER — POLYETHYLENE GLYCOL 3350 17 G PO PACK
17.0000 g | PACK | Freq: Every day | ORAL | Status: DC
  Administered 2013-04-16 – 2013-04-19 (×4): 17 g via ORAL
  Filled 2013-04-16 (×4): qty 1

## 2013-04-16 MED ORDER — VITAMIN E 180 MG (400 UNIT) PO CAPS
400.0000 [IU] | ORAL_CAPSULE | Freq: Every day | ORAL | Status: DC
  Administered 2013-04-16 – 2013-04-19 (×4): 400 [IU] via ORAL
  Filled 2013-04-16 (×4): qty 1

## 2013-04-16 MED ORDER — ENOXAPARIN SODIUM 40 MG/0.4ML ~~LOC~~ SOLN
40.0000 mg | SUBCUTANEOUS | Status: DC
  Administered 2013-04-16 – 2013-04-19 (×4): 40 mg via SUBCUTANEOUS
  Filled 2013-04-16 (×4): qty 0.4

## 2013-04-16 MED ORDER — DEXAMETHASONE SODIUM PHOSPHATE 10 MG/ML IJ SOLN
10.0000 mg | Freq: Four times a day (QID) | INTRAMUSCULAR | Status: DC
  Administered 2013-04-16 – 2013-04-19 (×15): 10 mg via INTRAVENOUS
  Filled 2013-04-16 (×18): qty 1

## 2013-04-16 MED ORDER — POTASSIUM 99 MG PO TABS: 1.0000 | ORAL_TABLET | Freq: Every day | ORAL | Status: DC

## 2013-04-16 MED ORDER — HYDROCHLOROTHIAZIDE 12.5 MG PO CAPS
12.5000 mg | ORAL_CAPSULE | Freq: Every day | ORAL | Status: DC
  Administered 2013-04-16 – 2013-04-19 (×4): 12.5 mg via ORAL
  Filled 2013-04-16 (×4): qty 1

## 2013-04-16 MED ORDER — PANTOPRAZOLE SODIUM 40 MG PO TBEC
40.0000 mg | DELAYED_RELEASE_TABLET | Freq: Every day | ORAL | Status: DC
  Administered 2013-04-16 – 2013-04-19 (×4): 40 mg via ORAL
  Filled 2013-04-16 (×4): qty 1

## 2013-04-16 MED ORDER — LOSARTAN POTASSIUM-HCTZ 50-12.5 MG PO TABS: 1.0000 | ORAL_TABLET | Freq: Every morning | ORAL | Status: DC

## 2013-04-16 MED ORDER — PROMETHAZINE HCL 25 MG PO TABS: 25.0000 mg | ORAL_TABLET | Freq: Four times a day (QID) | ORAL | Status: DC | PRN

## 2013-04-16 MED ORDER — ACETAMINOPHEN 325 MG PO TABS: 650.0000 mg | ORAL_TABLET | Freq: Four times a day (QID) | ORAL | Status: DC | PRN

## 2013-04-16 MED ORDER — ADULT MULTIVITAMIN W/MINERALS CH
1.0000 | ORAL_TABLET | Freq: Every day | ORAL | Status: DC
  Administered 2013-04-16 – 2013-04-19 (×4): 1 via ORAL
  Filled 2013-04-16 (×4): qty 1

## 2013-04-16 MED ORDER — SALINE SPRAY 0.65 % NA SOLN
2.0000 | Freq: Every day | NASAL | Status: DC | PRN
  Filled 2013-04-16: qty 44

## 2013-04-16 NOTE — Progress Notes (Signed)
Pt had 1 st Radiation tx today. Appetite good

## 2013-04-16 NOTE — Progress Notes (Deleted)
PT Cancellation Note  Patient Details Name: AUBRIEE SZETO MRN: [id number] DOB: [birth year]   Cancelled Treatment:    Reason Eval/Treat Not Completed: Other (comment) PT order received and per PT order, start tomorrow.   Valentine Kuechle,KATHrine E 04/16/2013, 8:37 AM Carmelia Bake, PT, DPT 04/16/2013 Pager: [phone]

## 2013-04-16 NOTE — Progress Notes (Signed)
  Radiation Oncology         [phone]) [phone] ________________________________  Name: Ashley West  MRN: [id number]  Date: 04/16/2013  DOB: 1940/06/05  Simulation Verification Note  Status: inpatient  NARRATIVE: The patient was brought to the treatment unit and placed in the planned treatment position. The clinical setup was verified. Then port films were obtained and uploaded to the radiation oncology medical record software.  The treatment beams were carefully compared against the planned radiation fields. The position location and shape of the radiation fields was reviewed. They targeted volume of tissue appears to be appropriately covered by the radiation beams. Organs at risk appear to be excluded as planned.  Based on my personal review, I approved the simulation verification. The patient's treatment will proceed as planned.  ------------------------------------------------  Sheral Apley Tammi Klippel, M.D.

## 2013-04-16 NOTE — Progress Notes (Signed)
Checked on patient after arrival from [facility].  Patient asleep and comfortable.  Orders appear intact.  VSS, afebrile, NAD. Imogene Burn, PA-C [facility] Pager: [phone]

## 2013-04-16 NOTE — Consult Note (Signed)
Radiation Oncology         [phone] ________________________________  Name: Ashley West MRN: [id number]  Date: 04/15/2013  DOB: 1940/01/13  INPATIENT Follow-Up Visit Note  CC: [name], MD  No ref. provider found  Diagnosis:   73 y.o. female with recurrent spinal cord compression and lower extremity weakness from B-cell lymphoma  Narrative:  The patient returns today with hospitalization for increasing leg weakness. MRI shows recurrent tumor at T5-7 and cord compression.  Neurosurgery was consulted and recommended radiation oncology consult.  To review recent events,    had become weak in her lower extremities in early February. Around mid-February Dr. Erline Levine did an emergency operation which was a laminectomy from T5-T8 to decompress epidural tumor from the region. 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 The patient was advised regarding chemotherapy but she declined. Her legs get stronger after surgery she was able to ambulate until the past week's time. Since she's noted progressive weakness in her legs and she has not been able to bear weight or walk independently in last few days.                          ALLERGIES:  has No Known Allergies.  Meds: Current Facility-Administered Medications  Medication Dose Route Frequency Provider Last Rate Last Dose  . acetaminophen (TYLENOL) tablet 650 mg  650 mg Oral Q6H PRN Bynum Bellows, MD       Or  . acetaminophen (TYLENOL) suppository 650 mg  650 mg Rectal Q6H PRN Bynum Bellows, MD      . dexamethasone (DECADRON) injection 10 mg  10 mg Intravenous 4 times per day Bynum Bellows, MD   10 mg at 04/16/13 0552  . enoxaparin (LOVENOX) injection 40 mg  40 mg Subcutaneous Q24H Bynum Bellows, MD   40 mg at 04/16/13 0950  . folic acid-pyridoxine-cyancobalamin (FOLTX) 2.5-25-2 MG per tablet 1 tablet  1 tablet Oral Daily Bynum Bellows, MD   1 tablet at 04/16/13 0950  . hydrochlorothiazide (MICROZIDE) capsule 12.5 mg  12.5 mg Oral Daily  Bynum Bellows, MD   12.5 mg at 04/16/13 0951  . losartan (COZAAR) tablet 50 mg  50 mg Oral Daily Bynum Bellows, MD   50 mg at 04/16/13 0950  . multivitamin with minerals tablet 1 tablet  1 tablet Oral Daily Bynum Bellows, MD   1 tablet at 04/16/13 0950  . ondansetron (ZOFRAN) tablet 4 mg  4 mg Oral Q6H PRN Bynum Bellows, MD       Or  . ondansetron (ZOFRAN) injection 4 mg  4 mg Intravenous Q6H PRN Bynum Bellows, MD      . pantoprazole (PROTONIX) EC tablet 40 mg  40 mg Oral Daily Bynum Bellows, MD   40 mg at 04/16/13 0951  . polyethylene glycol (MIRALAX / GLYCOLAX) packet 17 g  17 g Oral Daily Bynum Bellows, MD   17 g at 04/16/13 0950  . promethazine (PHENERGAN) tablet 25 mg  25 mg Oral Q6H PRN Bynum Bellows, MD      . sodium chloride (OCEAN) 0.65 % nasal spray 2-3 spray  2-3 spray Each Nare Daily PRN Bynum Bellows, MD      . vitamin C (ASCORBIC ACID) tablet 500 mg  500 mg Oral Daily Bynum Bellows, MD   500 mg at 04/16/13 0950  . vitamin E capsule 400 Units  400 Units Oral Daily Bynum Bellows, MD   400 Units at 04/16/13 1601    Physical Findings: The patient is in no acute distress. Patient is alert and oriented.  height is 5' (1.524 m) and weight is 123 lb 3.8 oz (55.9 kg). Her oral temperature is 98.4 F (36.9 C). Her blood pressure is 131/61 and her pulse is 65. Her respiration is 18 and oxygen saturation is 100%. .  Per neurosurgery:  Blood pressure 177/71, pulse 63, temperature 98.3 F (36.8 C), temperature source Oral, resp. rate 22, SpO2 100.00%.  Physical Exam  Musculoskeletal:  Healed midline scar in back  Neurological:  Alert and oriented. Lower extremity strength reveals 3/5 strength in iliopsoas 2/5 quad 2/5 strength in tibialis anterior and gastrocs sensation appears intact. Upper extremity strength appears intact.  Skin: Skin is warm and dry.  Psychiatric: She has a normal mood and affect. Her behavior is normal. Judgment and thought content normal.      Lab  Findings: Lab Results  Component Value Date   WBC 8.3 04/16/2013   HGB 10.8* 04/16/2013   HCT 34.7* 04/16/2013   MCV 82.6 04/16/2013   PLT 466* 04/16/2013   Radiographic Findings: Mr Thoracic Spine W Wo Contrast  04/15/2013   CLINICAL DATA:  Status post decompression for intraspinal tumor, representing lymphoma. Increasing inability to walk.  EXAM: MRI THORACIC SPINE WITHOUT AND WITH CONTRAST  TECHNIQUE: Multiplanar and multiecho pulse sequences of the thoracic spine were obtained without and with intravenous contrast.  CONTRAST:  63mL MULTIHANCE GADOBENATE DIMEGLUMINE 529 MG/ML IV SOLN  COMPARISON:  CT CHEST W/CM dated 03/02/2013; MR T SPINE WO/W CM dated 02/22/2013; MR L SPINE WO/W CM dated 02/22/2013  FINDINGS: There has been thoracic decompression from the T5 through T7 levels 02/22/2013. Significant tumor regrowth within the spinal canal has occurred. There is significant cord compression with abnormal cord signal implying ischemia or infarction. No definite intramedullary tumor. Compression of the spinal cord is greater from the right than left. Some ventral tumor at these levels is also present. A large right and small left pleural effusion is evident, new from prior chest CT. Widespread osseous metastatic disease is seen elsewhere in the thoracic vertebral bodies without frank pathologic compression deformity.  IMPRESSION: Significant tumor regrowth within the spinal canal has occurred since prior thoracic decompression. There is significant cord compression with abnormal cord signal, implying ischemia or infarction.  Findings discussed with ordering provider.   Electronically Signed   By: Rolla Flatten M.D.   On: 04/15/2013 19:53   Mr Lumbar Spine W Wo Contrast  04/15/2013   CLINICAL DATA:  Widespread lymphoma, status post thoracic decompression now with increasing inability to walk.  EXAM: MRI LUMBAR SPINE WITHOUT AND WITH CONTRAST  TECHNIQUE: Multiplanar and multiecho pulse sequences of the lumbar spine were  obtained without and with intravenous contrast.  CONTRAST:  16mL MULTIHANCE GADOBENATE DIMEGLUMINE 529 MG/ML IV SOLN  COMPARISON:  CT ABD/PELVIS W CM dated 03/02/2013; MR L SPINE WO/W CM dated 02/22/2013. Thoracic MRI without and with contrast reported separately.  FINDINGS: There is widespread osseous disease to the lumbar spine. The greatest involvement remains in the S1 vertebral body. No intraspinal tumor is evident. The conus is unaffected. There is no significant disc protrusion. There may be mild retroperitoneal adenopathy but not of a bulky nature. Slight presacral fluid of uncertain significance. Bladder not grossly distended. Osseous disease noted in the iliac bones and lower sacrum as well.  IMPRESSION: Widespread osseous disease to  the lumbar spine similar to that noted 02/22/2013. No intraspinal tumor is evident. There is no cauda equina compression.   Electronically Signed   By: Rolla Flatten M.D.   On: 04/15/2013 20:00    Impression:  The patient has recurrent spinal cord compression following laminectomy and refusal of chemotherapy.  She may benefit from localized radiotherapy.  Plan:  Today, I talked to the patient and family about the findings and work-up thus far.  We discussed the natural history of cord compression from lymphoma and general treatment, highlighting the role or radiotherapy in the management.  We discussed the available radiation techniques, and focused on the details of logistics and delivery.  We reviewed the anticipated acute and late sequelae associated with radiation in this setting.  The patient was encouraged to ask questions that I answered to the best of my ability.  I filled out a patient counseling form during our discussion including treatment diagrams.  We retained a copy for our records.  The patient would like to proceed with radiation and will be scheduled for CT simulation.  I spent 60 minutes minutes face to face with the patient and more than 50% of that  time was spent in counseling and/or coordination of care.   _____________________________________  Sheral Apley. Tammi Klippel, M.D.

## 2013-04-16 NOTE — Procedures (Signed)
  Radiation Oncology         [phone] ________________________________  Name: Ashley West MRN: [id number]  Date: 04/15/2013  DOB: [birth year]  INPATIENT  SIMULATION AND TREATMENT PLANNING NOTE  DIAGNOSIS:  73 yo woman with recurrent spinal cord compression from b-cell lymphoma at T5-T7  NARRATIVE:  The patient was brought to the [facility].  Identity was confirmed.  All relevant records and images related to the planned course of therapy were reviewed.  The patient freely provided informed written consent to proceed with treatment after reviewing the details related to the planned course of therapy. The consent form was witnessed and verified by the simulation staff.  Then, the patient was set-up in a stable reproducible  supine position for radiation therapy.  CT images were obtained.  Surface markings were placed.  The CT images were loaded into the planning software.  Then the target and avoidance structures were contoured.  Treatment planning then occurred.  The radiation prescription was entered and confirmed.  Then, I designed and supervised the construction of a total of zero  complex treatment devices.  Two unmodified rectangular port fields will be treated AP PA covering T4-T8 inclusively.  I have requested : Isodose Plan.  I have ordered:Nutrition Consult  PLAN:  The patient will receive 30 Gy in 10 fractions.  ________________________________  Sheral Apley Tammi Klippel, M.D.

## 2013-04-16 NOTE — Evaluation (Signed)
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  MRI confirmed thoracic spinal cord compression at T5-T7.  Pt currently with functional limitations due to the deficits listed below (see PT Problem List).  Pt reports she had been unable to move legs yesterday but had received a steroid shot and radiation therapy and seemed improved.  Pt required total assist to come to EOB and had no trunk control to remain upright without support.  Light touch sensation was intact but deep pressure was not perceived by pt (reports all touches 'feel like a feather').  Generalized weakness of B LE, however L slightly stronger than R.  Pt did not want to attempt standing as she felt weak, had radiation, and was fearful she would not be able to stand.  Role of PT explained and pt has interest in learning about wheelchair mobility and transferring using UE.  Pt currently needs more care than available at home.  Pt will benefit from skilled PT to increase their independence and safety with  Mobility to allow discharge.      Follow Up Recommendations SNF    Equipment Recommendations  Wheelchair (measurements PT);Hospital bed    Recommendations for Other Services       Precautions / Restrictions Precautions Precautions: Fall;Back Precaution Comments: tumor in  thoracic spine causes back pain, use log roll technique for minimized pain Restrictions Weight Bearing Restrictions: No      Mobility  Bed Mobility Overal bed mobility: Needs Assistance;+2 for physical assistance Bed Mobility: Rolling;Sidelying to Sit;Sit to Sidelying Rolling: Total assist;+2 for physical assistance Sidelying to sit: Total assist;+2 for physical assistance     Sit to sidelying: Total assist;+2 for physical assistance General bed mobility comments: cues for log roll technique to minimize back pain; pt attempted to assist bringing trunk into sitting but was unsuccessful  Transfers                 General transfer comment: pt wanted to defer attempting to stand due to  weakness and radiation therapy earlier in the day  Ambulation/Gait                Stairs            Wheelchair Mobility    Modified Rankin (Stroke Patients Only)       Balance Overall balance assessment: Needs assistance Sitting-balance support: Bilateral upper extremity supported Sitting balance-Leahy Scale: Poor Sitting balance - Comments: pt reports she must hold on with UE and have support behind her in sitting to stay upright; poor trunk control                                     Pertinent Vitals/Pain Activity to tolerance.  Back pain minimized with log roll technique for bed mobility.    Home Living Family/patient expects to be discharged  to:: Private residence Living Arrangements: Alone Available Help at Discharge: Family (husband works 40 hr/wk) Type of Home: House Home Access: Stairs to enter   Technical brewer of Steps: 1 [place]: One level Home Equipment: Environmental consultant - 4 wheels;Cane - quad Additional Comments: reports husband is able to take her down the one step while she sits in her 4 wheeled walker    Prior Function Level of Independence: Independent         Comments: PTA pt was ambulating with 4 wheeled walker; reports in  the last week she has become too weak to walk with her walker, husband pushes her in the walker seat     Hand Dominance   Dominant Hand: Right    Extremity/Trunk Assessment   Upper Extremity Assessment: Generalized weakness           Lower Extremity Assessment: Generalized weakness;RLE deficits/detail;LLE deficits/detail RLE Deficits / Details: Generalized weakness of RLE, had light touch sensation but not deep pressure over whole RLE; hip flexion (2-/5), knee extension (2+/5), df (4/5), eversion (4/5), knee flexion (2/5), abduction (4/5), adduction (4/5) LLE Deficits / Details: LLE was mildly stronger than RLE; had light touch sensation but not deep pressure over whole LLE; hip flexion (3/5), knee extension (3/5), df (4/5), eversion (4/5), knee flexion (2/5), abduction (4/5), adduction (4/5)  Cervical / Trunk Assessment: Other exceptions  Communication   Communication: No difficulties  Cognition Arousal/Alertness: Awake/alert Behavior During Therapy: WFL for tasks assessed/performed Overall Cognitive Status: Within Functional Limits for tasks assessed                      General Comments      Exercises        Assessment/Plan    PT Assessment Patient needs continued PT services  PT Diagnosis Difficulty walking;Generalized weakness   PT Problem List Decreased strength;Decreased activity tolerance;Decreased balance;Decreased mobility;Decreased knowledge of use of DME;Impaired sensation  PT Treatment Interventions DME instruction;Gait training;Functional mobility training;Therapeutic activities;Therapeutic exercise;Balance training;Neuromuscular re-education;Patient/family education;Wheelchair mobility training   PT Goals (Current goals can be found in the Care Plan section) Acute Rehab PT Goals Patient Stated Goal: to not be so weak, go home and be strong enough to walk PT Goal Formulation: With patient Time For Goal Achievement: 04/23/13 Potential to Achieve  Goals: Fair    Frequency Min 3X/week   Barriers to discharge        Co-evaluation               End of Session   Activity Tolerance: Patient tolerated treatment well Patient left: in bed;with call bell/phone within reach;with family/visitor present           Time: 1448-1856 PT Time Calculation (min): 15 min   Charges:   PT Evaluation $Initial PT Evaluation Tier I: 1 Procedure PT Treatments $Therapeutic Activity: 8-22 mins   PT G Codes:          Jacqulyn Cane 04/16/2013, 4:08 PM Jacqulyn Cane SPT 04/16/2013

## 2013-04-16 NOTE — Progress Notes (Addendum)
[name]      DOB: 26-Feb-1940      GGY:[id number]  Consult received for Goals of care.  Visited with Ashley West who informed me that she does not want to talk to hospice and if palliative care is going to make her take chemo then she won't talk with them either.   I discussed with her that we would like to formalize her goals and if that included no chemo then the would be what we would support and respect that. I offered to include any family member that she desired and she stated "its all talk" I won't talk. She then informed me that her goals stand- she will do anything but chemo.  I spoke with her briefly that with that in mind that we might want to plan for what support she would need if her disease progresses.  She remains resistant to intervention.  I gave her my card and asked if she would call if we could comeback because I really thought we might be able to help.  She then opened up a little be more and told me her husband is her only care giver and he is having to lift her to the toilet.  He works full time.  At a minimum, this patient will need additional personal care at home if she can safely return home.  I updated SW who will update CM.  Patient is a at high risk to need SNF level care.  Patient deferred exam  Updated Onc PA and Dr. Daleen Bo. Discussed case with Dr. Alvy Bimler.  We will give her some space and then try to see if she will open up further for formal consult.  Time: 1130-1150 am  50% of this time was spent counseling and coordinating care. Ashley Najjar L. Lovena Le, MD MBA The Palliative Medicine Team at [facility] Phone: [phone] Pager: [phone]

## 2013-04-16 NOTE — Evaluation (Signed)
I have reviewed this note and agree with all findings. Kati Willam Munford, PT, DPT Pager: [phone]   

## 2013-04-16 NOTE — Progress Notes (Signed)
[facility] PROGRESS NOTE  Ashley West CWC:[id number] DOB: August 04, 1940 DOA: 04/15/2013 PCP: [name], MD  Assessment/Plan: 73 y.o. 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 Patient was evaluated by Dr. Ellene Route, neurosurgery, he felt that the patient was not a good candidate for surgery at this time.  1. Bilateral lower extremity weakness Due to significant tumor regrowth within the spinal canal and significant cord compression. S/p laminectomy from T5-T8 to decompress epidural tumor 02/22/13  -Patient discussed options with Dr. Ellene Route, neurosurgery, patient was deemed not a candidate for surgery at this time. Continue dexamethasone 10 mg IV every 6 hours. Patient still refusing chemo; consulted oncology ? radiation therapy.  2. Diffuse large B-cell lymphoma Patient continues to decline chemotherapy; f/u hematology eval   3. Hypertension Continue antihypertensive medications. Stable.  4. Alcohol abuse no h/o or s/s of withdrawal; Counseled on cessation. Continue home multivitamin and B12.  5. Generalized weakness Request physical therapy and occupational therapy evaluation in the morning.    Code Status: DNR Family Communication: d/w patient (indicate person spoken with, relationship, and if by phone, the number) Disposition Plan: pend clinical improvement    Consultants:  Oncology  palliatyive care   Procedures:  None   Antibiotics:  None  (indicate start date, and stop date if known)  HPI/Subjective: aler  Objective: Filed Vitals:   04/16/13 0500  BP: 131/61  Pulse: 65   Temp: 98.4 F (36.9 C)  Resp: 18    Intake/Output Summary (Last 24 hours) at 04/16/13 0908 Last data filed at 04/16/13 0500  Gross per 24 hour  Intake    120 ml  Output    701 ml  Net   -581 ml   Filed Weights   04/16/13 0500  Weight: 55.9 kg (123 lb 3.8 oz)    Exam:   General:  alert  Cardiovascular: s1,s2 rrr  Respiratory: CTA BL  Abdomen: soft, nt,nd   Musculoskeletal: no LE edema   Data Reviewed: Basic Metabolic Panel:  Recent Labs Lab 04/15/13 1312 04/16/13 0402  NA 137 136*  K 3.9 4.4  CL 98 102  CO2 24 23  GLUCOSE 96 192*  BUN 13 16  CREATININE 0.80 0.85  CALCIUM 11.2* 11.2*   Liver Function Tests:  Recent Labs Lab 04/15/13 1312  AST 19  ALT 26  ALKPHOS 102  BILITOT 0.4  PROT 6.9  ALBUMIN 3.7   No results found for this basename: LIPASE, AMYLASE,  in the last 168 hours No results found for this basename: AMMONIA,  in the last 168 hours CBC:  Recent Labs Lab 04/15/13 1312 04/16/13 0402  WBC 5.8 8.3  NEUTROABS 4.6  --   HGB 11.0* 10.8*  HCT 34.5* 34.7*  MCV 82.1 82.6  PLT 454* 466*   Cardiac Enzymes: No results found for this basename: CKTOTAL, CKMB, CKMBINDEX, TROPONINI,  in the last 168 hours BNP (last 3 results) No results found for this basename: PROBNP,  in the last 8760 hours CBG: No results found for this basename: GLUCAP,  in the last 168 hours  No results found for this or any previous visit (from the past 240 hour(s)).   Studies:  Mr Thoracic Spine W Wo Contrast  04/15/2013   CLINICAL DATA:  Status post decompression for intraspinal tumor, representing lymphoma. Increasing inability to walk.  EXAM: MRI THORACIC SPINE WITHOUT AND WITH CONTRAST  TECHNIQUE: Multiplanar and multiecho pulse sequences of the thoracic spine were obtained without and with intravenous contrast.  CONTRAST:  20mL MULTIHANCE GADOBENATE DIMEGLUMINE 529 MG/ML IV SOLN  COMPARISON:  CT CHEST W/CM dated 03/02/2013; MR T SPINE WO/W CM dated 02/22/2013; MR  L SPINE WO/W CM dated 02/22/2013  FINDINGS: There has been thoracic decompression from the T5 through T7 levels 02/22/2013. Significant tumor regrowth within the spinal canal has occurred. There is significant cord compression with abnormal cord signal implying ischemia or infarction. No definite intramedullary tumor. Compression of the spinal cord is greater from the right than left. Some ventral tumor at these levels is also present. A large right and small left pleural effusion is evident, new from prior chest CT. Widespread osseous metastatic disease is seen elsewhere in the thoracic vertebral bodies without frank pathologic compression deformity.  IMPRESSION: Significant tumor regrowth within the spinal canal has occurred since prior thoracic decompression. There is significant cord compression with abnormal cord signal, implying ischemia or infarction.  Findings discussed with ordering provider.   Electronically Signed   By: Rolla Flatten M.D.   On: 04/15/2013 19:53   Mr Lumbar Spine W Wo Contrast  04/15/2013   CLINICAL DATA:  Widespread lymphoma, status post thoracic decompression now with increasing inability to walk.  EXAM: MRI LUMBAR SPINE WITHOUT AND WITH CONTRAST  TECHNIQUE: Multiplanar and multiecho pulse sequences of the lumbar spine were obtained without and with intravenous contrast.  CONTRAST:  76mL MULTIHANCE GADOBENATE DIMEGLUMINE 529 MG/ML IV SOLN  COMPARISON:  CT ABD/PELVIS W CM dated 03/02/2013; MR L SPINE WO/W CM dated 02/22/2013. Thoracic MRI without and with contrast reported separately.  FINDINGS: There is widespread osseous disease to the lumbar spine. The greatest involvement remains in the S1 vertebral body. No intraspinal tumor is evident. The conus is unaffected. There is no significant disc protrusion. There may be mild retroperitoneal adenopathy but not of a bulky nature. Slight presacral fluid of uncertain significance. Bladder not grossly distended. Osseous disease noted in the iliac  bones and lower sacrum as well.  IMPRESSION: Widespread osseous disease to the lumbar spine similar to that noted 02/22/2013. No intraspinal tumor is evident. There is no cauda equina compression.   Electronically Signed   By: Rolla Flatten M.D.   On: 04/15/2013 20:00    Scheduled Meds: . dexamethasone  10 mg Intravenous 4 times per day  . enoxaparin (LOVENOX) injection  40 mg Subcutaneous Q24H  . folic acid-pyridoxine-cyancobalamin  1 tablet Oral Daily  . hydrochlorothiazide  12.5 mg Oral Daily  . losartan  50 mg Oral Daily  . multivitamin with minerals  1 tablet Oral Daily  . pantoprazole  40 mg Oral Daily  . polyethylene glycol  17 g Oral Daily  . vitamin C  500 mg Oral Daily  . vitamin E  400 Units Oral Daily   Continuous Infusions:   Active Problems:   HTN (hypertension)   Alcohol abuse   Anemia   Diffuse large B cell lymphoma   Weakness of both lower extremities   Paraplegia   Weakness of lower extremity    Time spent: >35 minutes     Kinnie Feil  [facility] Pager [phone]. If 7PM-7AM, please contact night-coverage at [email or web address], password [facility] 04/16/2013, 9:08 AM  LOS: 1  day

## 2013-04-16 NOTE — Consult Note (Signed)
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 Humphrey Rolls on 02/27/2013 after presentation with bilateral lower extremity weakness and numbness, gait instability, urinary incontinence and constipation.  MRI of the thoracic and lumbar spine showed diffuse metastatic disease throughout. CT scan of the chest, abdomen and pelvis show no evidence of abnormal lesions.  The patient underwent urgent decompression surgery on 02/22/13. Pathology from the tumor came back positive for diffuse large B-cell lymphoma.  She declined systemic chemotherapy at the time of diagnosis.  She received high- dose intravenous steroids. She was subsequently discharged to a skilled nursing facility for rehabilitation and then home. Patient was last seen at [facility] on 03/12/13 for further discussion but eventually declined. She was symptomatic, with persistent back  pain.   HPI: On 04/15/2013 she presented to the ED with 2 week history of worsening, bilateral lower extremity weakness, worse 3 days prior to admission. MRI of her thoracic spine and lumbar spine on 04/15/13 with contrast showed significant tumor regrowth within the spinal canal  with significant cord compression, greater from the right than left along  with abnormal cord signal. No definite intramedullary tumor.Some ventral tumor at these levels is also present. A large right and small left pleural effusion is evident, new from prior chest CT. Widespread osseous metastatic disease is seen elsewhere in the thoracic vertebral bodies without frank pathologic compression deformity . In the lumbar region, the greatest involvement is at the S1 vertebral body, without intraspinal tumor.There may be mild  retroperitoneal adenopathy but not of a bulky nature. Slight presacral fluid of uncertain significance is seen. Osseous disease noted in the iliac bones and lower sacrum as well. Patient was evaluated by Dr. Ellene Route on 04/15/13, neurosurgery, he felt that the patient was not a good candidate for surgery at this time, as she refuses adjuvant chemo. She was admitted under the Hospitalist service. No other complaints at the present. Pain control adequate. We have been kindly informed of the patient's admission with recommendations.    Past Medical History  Diagnosis Date  . Hypertension   . Diffuse large B cell lymphoma   . Hiatal hernia   . GERD (gastroesophageal reflux disease)   . Weakness of both lower extremities 03/08/2013    Past Surgical History  Procedure Laterality Date  . Abdominal hysterectomy    . Cataract extraction      Bilateral  . Cyst excision      Left Elbow, Arm and back  . Esophagogastroduodenoscopy N/A 04/20/2012    Procedure: ESOPHAGOGASTRODUODENOSCOPY (EGD);  Surgeon: Cleotis Nipper, MD;  Location: [facility] ENDOSCOPY;  Service: Endoscopy;  Laterality: N/A;  . Laminectomy N/A 02/22/2013    Procedure: THORACIC LAMINECTOMY FOR TUMOR;  Surgeon: Erline Levine, MD;  Location: [facility] NEURO ORS;  Service: Neurosurgery;  Laterality: N/A;   Review of systems:  Constitutional: Denies fevers, chills or abnormal weight loss  Eyes: Denies blurriness of vision  Ears, nose, mouth, throat, and face: Denies mucositis or sore throat  Respiratory: Denies cough, dyspnea or wheezes  Cardiovascular: Denies palpitation, chest discomfort or lower extremity swelling  Gastrointestinal: Denies nausea, heartburn or change in bowel habits  Skin: Denies abnormal skin rashes  Lymphatics: Denies new lymphadenopathy or easy bruising  Neurological: as in HPI Behavioral/Psych: Mood is stable, no new changes  All other systems were reviewed with the patient and are negative.  MEDICATIONS:  Scheduled  Meds: . dexamethasone  10 mg Intravenous 4 times per day  . enoxaparin (LOVENOX) injection  40 mg Subcutaneous Q24H  . folic acid-pyridoxine-cyancobalamin  1 tablet Oral Daily  . hydrochlorothiazide  12.5 mg Oral Daily  . losartan  50 mg Oral Daily  . multivitamin with minerals  1 tablet Oral Daily  . pantoprazole  40 mg Oral Daily  . polyethylene glycol  17 g Oral Daily  . vitamin C  500 mg Oral Daily  . vitamin E  400 Units Oral Daily   Continuous Infusions:  PRN Meds:.acetaminophen, acetaminophen, ondansetron (ZOFRAN) IV, ondansetron, promethazine, sodium chloride  ALLERGIES:  No Known Allergies  Family History  Problem Relation Age of Onset  . Breast cancer Sister   . Cancer Sister     uterine v/s ovarina?   History   Social History  . Marital Status: Single    Spouse Name: N/A    Number of Children: N/A  . Years of Education: N/A   Occupational History  . Not on file.   Social History Main Topics  . Smoking status: Never Smoker   . Smokeless tobacco: Never Used  . Alcohol Use: No     Comment: 2-3 beers per week  . Drug Use: No  . Sexual Activity: Not Currently   Other Topics Concern  . Not on file   Social History Narrative  . No narrative on file   PHYSICAL EXAMINATION:   Filed Vitals:   04/16/13 0500  BP: 131/61  Pulse: 65  Temp: 98.4 F (36.9 C)  Resp: 18     Filed Weights   04/16/13 0500  Weight: 123 lb 3.8 oz (55.9 kg)    GENERAL:alert, no distress and comfortable  HEENT: sclera anicteric. Oral cavity without thrush. No mucositis.  SKIN: skin color, texture, turgor are normal, no rashes or significant lesions  Lungs:  Essentially clear to auscultation Cardio: regular rate and rhythm, without murmurs, rubs or gallops. Abdomen: soft, non tender, bowel sounds present in all quadrants.  Musculoskeletal:no cyanosis of digits and no clubbing  NEURO: alert & oriented x 3 with fluent speech, upper extremities without motor or sensory deficits.  Lower extremities with 3/5 motor deficits and decreased sensation.   LABORATORY/RADIOLOGY DATA:   Recent Labs Lab 04/15/13 1312 04/16/13 0402  WBC 5.8 8.3  HGB 11.0* 10.8*  HCT 34.5* 34.7*  PLT 454* 466*  MCV 82.1 82.6  MCH 26.2 25.7*  MCHC 31.9 31.1  RDW 15.3 15.3  LYMPHSABS 0.8  --   MONOABS 0.4  --   EOSABS 0.1  --   BASOSABS 0.0  --     CMP    Recent Labs Lab 04/15/13 1312 04/16/13 0402  NA 137 136*  K 3.9 4.4  CL 98 102  CO2 24 23  GLUCOSE 96 192*  BUN 13 16  CREATININE 0.80 0.85  CALCIUM 11.2* 11.2*  AST 19  --   ALT 26  --   ALKPHOS 102  --   BILITOT 0.4  --         Component Value Date/Time   BILITOT 0.4 04/15/2013 1312   BILITOT 0.29 03/12/2013 1503      Urinalysis    Component Value Date/Time   COLORURINE YELLOW 04/15/2013 [place] 04/15/2013 1338   LABSPEC 1.012 04/15/2013 1338  PHURINE 7.5 04/15/2013 1338   GLUCOSEU NEGATIVE 04/15/2013 1338   HGBUR NEGATIVE 04/15/2013 [place] 04/15/2013 1338   KETONESUR NEGATIVE 04/15/2013 1338   PROTEINUR NEGATIVE 04/15/2013 1338   UROBILINOGEN 0.2 04/15/2013 1338   NITRITE NEGATIVE 04/15/2013 1338   LEUKOCYTESUR TRACE* 04/15/2013 1338     Liver Function Tests:  Recent Labs Lab 04/15/13 1312  AST 19  ALT 26  ALKPHOS 102  BILITOT 0.4  PROT 6.9  ALBUMIN 3.7    Radiological Studies:  Mr Thoracic Spine W Wo Contrast  04/15/2013    COMPARISON:  CT CHEST W/CM dated 03/02/2013; MR T SPINE WO/W CM dated 02/22/2013; MR L SPINE WO/W CM dated 02/22/2013  FINDINGS: There has been thoracic decompression from the T5 through T7 levels 02/22/2013. Significant tumor regrowth within the spinal canal has occurred. There is significant cord compression with abnormal cord signal implying ischemia or infarction. No definite intramedullary tumor. Compression of the spinal cord is greater from the right than left. Some ventral tumor at these levels is also present. A large right and small left pleural  effusion is evident, new from prior chest CT. Widespread osseous metastatic disease is seen elsewhere in the thoracic vertebral bodies without frank pathologic compression deformity.  IMPRESSION: Significant tumor regrowth within the spinal canal has occurred since prior thoracic decompression. There is significant cord compression with abnormal cord signal, implying ischemia or infarction.  Findings discussed with ordering provider.   Electronically Signed   By: Rolla Flatten M.D.   On: 04/15/2013 19:53   Mr Lumbar Spine W Wo Contrast  04/15/2013   COMPARISON:  CT ABD/PELVIS W CM dated 03/02/2013; MR L SPINE WO/W CM dated 02/22/2013. Thoracic MRI without and with contrast reported separately.  FINDINGS: There is widespread osseous disease to the lumbar spine. The greatest involvement remains in the S1 vertebral body. No intraspinal tumor is evident. The conus is unaffected. There is no significant disc protrusion. There may be mild retroperitoneal adenopathy but not of a bulky nature. Slight presacral fluid of uncertain significance. Bladder not grossly distended. Osseous disease noted in the iliac bones and lower sacrum as well.  IMPRESSION: Widespread osseous disease to the lumbar spine similar to that noted 02/22/2013. No intraspinal tumor is evident. There is no cauda equina compression.   Electronically Signed   By: Rolla Flatten M.D.   On: 04/15/2013 20:00   ASSESSMENT AND PLAN:   1. Bilateral lower extremity weakness Due to significant progression of metastatic disease within the spinal canal and significant cord compression. S/p laminectomy from T5-T8 to decompress epidural tumor 02/22/13. Neurosurgery deemed patient not a candidate for surgery .She is on Dexamethasone 10 mg IV every 6 hours. Patient continues to refuse chemotherapy. Simulation in process (Dr. Tammi Klippel, Rad Onc).  Palliative Care consultation may be appropriate.   2. Diffuse large B-cell lymphoma Patient continues to decline  chemotherapy.  3. Hypertension Continue antihypertensive medications. Stable.   4. Alcohol abuse  Continue home multivitamin and B12.   5. Generalized weakness Physical therapy and Occupational therapy to see today.   I recommend palliative care to refer her to home hospice after discharge. The patient is aware of poor prognosis without systemic chemotherapy. Plan of care is discussed with the hospitalist. I will sign off. Please call if questions arise.  Code Status: DNR  Rondel Jumbo, PA-C 04/16/2013, 9:21 AM Alvy Bimler, Gwyn Mehring, MD 04/16/2013

## 2013-04-17 ENCOUNTER — Ambulatory Visit
Admit: 2013-04-17 | Discharge: 2013-04-17 | Disposition: A | Discharge status: Home / Self Care | Payer: Medicare HMO | Attending: Radiation Oncology | Admitting: Radiation Oncology

## 2013-04-17 LAB — CBC WITH DIFFERENTIAL/PLATELET
BASOS ABS: 0 10*3/uL (ref 0.0–0.1)
Basophils Relative: 0 % (ref 0–1)
Eosinophils Absolute: 0 10*3/uL (ref 0.0–0.7)
Eosinophils Relative: 0 % (ref 0–5)
HEMATOCRIT: 34.4 % — AB (ref 36.0–46.0)
Hemoglobin: 11 g/dL — ABNORMAL LOW (ref 12.0–15.0)
LYMPHS PCT: 4 % — AB (ref 12–46)
Lymphs Abs: 0.9 10*3/uL (ref 0.7–4.0)
MCH: 25.8 pg — AB (ref 26.0–34.0)
MCHC: 32 g/dL (ref 30.0–36.0)
MCV: 80.8 fL (ref 78.0–100.0)
MONO ABS: 0.7 10*3/uL (ref 0.1–1.0)
Monocytes Relative: 4 % (ref 3–12)
Neutro Abs: 19 10*3/uL — ABNORMAL HIGH (ref 1.7–7.7)
Neutrophils Relative %: 92 % — ABNORMAL HIGH (ref 43–77)
Platelets: 456 10*3/uL — ABNORMAL HIGH (ref 150–400)
RBC: 4.26 MIL/uL (ref 3.87–5.11)
RDW: 15.3 % (ref 11.5–15.5)
WBC: 20.6 10*3/uL — ABNORMAL HIGH (ref 4.0–10.5)

## 2013-04-17 LAB — PROTIME-INR
INR: 1.03 (ref 0.00–1.49)
Prothrombin Time: 13.3 seconds (ref 11.6–15.2)

## 2013-04-17 LAB — APTT: aPTT: 32 seconds (ref 24–37)

## 2013-04-17 MED ORDER — CEFAZOLIN SODIUM-DEXTROSE 2-3 GM-% IV SOLR
2.0000 g | Freq: Once | INTRAVENOUS | Status: DC
  Filled 2013-04-17: qty 50

## 2013-04-17 MED ORDER — SENNOSIDES-DOCUSATE SODIUM 8.6-50 MG PO TABS
1.0000 | ORAL_TABLET | Freq: Two times a day (BID) | ORAL | Status: DC
Start: 2013-04-17 — End: 2013-04-19
  Administered 2013-04-17 – 2013-04-19 (×3): 1 via ORAL
  Filled 2013-04-17 (×5): qty 1

## 2013-04-17 MED ORDER — SODIUM CHLORIDE 0.9 % IV SOLN: INTRAVENOUS | Status: DC

## 2013-04-17 NOTE — Evaluation (Signed)
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  She will benefit from skilled OT to increase safety and independence with adls.  Pt is flexible and can reach feet when in supported sitting by crossing legs.  OT will focus on sitting balance as well as mobility during adls to increase independence with adls.  Prior to admission, pt was independent with adls but she had a suddent onset of LE weakness which brought her to ED.      Follow Up Recommendations  SNF    Equipment Recommendations  3 in 1 bedside comode    Recommendations for Other Services       Precautions / Restrictions Precautions Precautions: Fall;Back Restrictions Weight Bearing Restrictions: No      Mobility Bed Mobility     Rolling: Mod assist;Max assist         General bed mobility comments: mod to L; max to R.  assistance to keep back straight  Transfers                      Balance                                            ADL Overall ADL's : Needs assistance/impaired Eating/Feeding: Independent;Bed level   Grooming: Set up;Bed level   Upper Body Bathing: Set up;Bed level   Lower Body Bathing: Moderate assistance Lower Body Bathing Details (indicate cue type and reason): mod A for rolling to L Upper Body Dressing : Set up;Bed level   Lower Body Dressing: Moderate assistance;Bed level       Toileting- Clothing  Manipulation and Hygiene: Maximal assistance;Bed level         General ADL Comments: Assessment performed bed level, HOB raised and also rolling for adls.  Pt is able to cross bil LEs with HOB raised to reach feet.  She needs mod A to roll to  L and max A to roll to R.  Pt assists LLE to cross leg.  Pt reports increased movement in bil LEs.  Did not feel strong enough to sit eob.  Reviewed back precautions     Vision                     Perception     Praxis      Pertinent Vitals/Pain No c/o pain.  Observed back precautions     Hand Dominance Right   Extremity/Trunk Assessment Upper Extremity Assessment Upper Extremity Assessment: Overall WFL for tasks assessed (RUE 4+/5 LUE 4/5)           Communication Communication Communication: No difficulties   Cognition Arousal/Alertness: Awake/alert Behavior During Therapy: WFL for tasks assessed/performed Overall Cognitive Status: Within Functional Limits for tasks assessed                     General Comments  Exercises       Shoulder Instructions      Home Living Family/patient expects to be discharged to:: Unsure                                 Additional Comments: Per chart possibly SNF/hospice.  Pt's husband works FT      Prior Functioning/Environment Level of Independence: Independent            OT Diagnosis: Generalized weakness   OT Problem List: Decreased strength;Decreased activity tolerance;Impaired balance (sitting and/or standing)   OT Treatment/Interventions: Self-care/ADL training;Therapeutic activities;Energy conservation;Patient/family education;Balance training    OT Goals(Current goals can be found in the care plan section) Acute Rehab OT Goals Patient Stated Goal: be able to sit on commode for BM OT Goal Formulation: With patient Time For Goal Achievement: 05/01/13 Potential to Achieve Goals: Good ADL Goals Pt Will Transfer to Toilet: with +2  assist;with mod assist;squat pivot transfer;with transfer board;bedside commode (drop arm) Additional ADL Goal #1: pt will maintain static sitting at eob with min A, bil UE support x 3 minutes in preparation for adls Additional ADL Goal #2: Pt will roll to Bil side with min A for adls  OT Frequency: Min 2X/week   Barriers to D/C:            Co-evaluation              End of Session    Activity Tolerance: Patient limited by pain Patient left: in bed;with call bell/phone within reach   Time: 9629-5284 OT Time Calculation (min): 18 min Charges:  OT General Charges $OT Visit: 1 Procedure OT Evaluation $Initial OT Evaluation Tier I: 1 Procedure OT Treatments $Therapeutic Activity: 8-22 mins G-Codes:    Janis Cuffe 2013/04/25, 11:18 AM  Lesle Chris, OTR/L [phone] 04/25/2013

## 2013-04-17 NOTE — Progress Notes (Signed)
CSW continuing to follow for disposition planning.  CSW met with pt and pt sister at bedside.   CSW provided bed offers for SNF and clarified pt questions regarding insurance coverage for SNF.  Pt is agreeable to SNF placement atleast throughout the completion of radiation treatment.   Pt with the assistance of pt sister chose bed at [facility] which is in-network facility with pt insurance company.  CSW notified [facility] who confirmed that they could accept pt and will begin insurance authorization process through [facility]   CSW to continue to follow and facilitate pt discharge needs when pt medically ready for discharge and insurance authorization received.  Alison Murray, MSW, [place] Work [phone]

## 2013-04-17 NOTE — Progress Notes (Signed)
Clinical Social Work Department BRIEF PSYCHOSOCIAL ASSESSMENT 04/17/2013  Patient:  Ashley West, Ashley West     Account Number:  [id number]     Admit date:  04/15/2013  Clinical Social Worker:  Ulyess Blossom  Date/Time:  04/17/2013 09:45 AM  Referred by:  Physician  Date Referred:  04/17/2013 Referred for  SNF Placement   Other Referral:   Interview type:  Patient Other interview type:    PSYCHOSOCIAL DATA Living Status:  HUSBAND Admitted from facility:   Level of care:   Primary support name:  Mortimer Fries Nylund/[phone] Primary support relationship to patient:  SPOUSE Degree of support available:   adequate    CURRENT CONCERNS Current Concerns  Post-Acute Placement   Other Concerns:    SOCIAL WORK ASSESSMENT / PLAN CSW received referral for New SNF.    CSW met with pt at bedside. CSW introduced self and explained role. Pt discussed that she lives with pt husband who works full time. Pt reports that pt husband comes home at lunch and pt sister lives near by and can come to pt home to provide assist. CSW discussed with pt about recommendation for rehab at [facility]. Pt stated that she was hesitant about SNF as she had to go to a facility following back surgery and did not have a good experience. CSW discussed that CSW could explore other options for SNF. Pt agreeable to [place] as pt was not satisfied with facility.    Pt agreed to SNF search, but continued to ask questions regarding home care needs. CSW discussed with pt that CSW can ask RNCM to see pt to address these questions. CSW discussed that if pt goes to SNF then [facility] can be arranged following short term SNF stay.    CSW completed FL2 and initiated SNF search to [place] at pt request.    CSW to follow up with pt regarding SNF bed offers and facilitate pt discharge needs when pt medically stable for discharge.   Assessment/plan status:   Psychosocial Support/Ongoing Assessment of Needs Other assessment/ plan:   discharge planning   Information/referral to community resources:   [facility] list    PATIENT'S/FAMILY'S RESPONSE TO PLAN OF CARE: Pt alert and oriented x 4. Pt reports strong support from pt spouse and pt sister. Pt appears hesitant about short term SNF placement, but when CSW discussed safety concerns at home the pt became to more agreeable. Pt agreeable to SNF search, but may be hesitant to agree to going to SNF upon discharge.       Alison Murray, MSW, [place] Work [phone]

## 2013-04-17 NOTE — Progress Notes (Signed)
Patient IE:PPIRJJ Ashley West      DOB: [birth year]      OAC:[id number]  Discussed case with SW.  Patient remains adamant about not participating in goals of care but did open up about needing higher level of care at least while doing radiation.  Will sign off .  Patient has our contact info and we would be glad to be re consulted when she is open to the opportunity.   Ashley Fomby L. Lovena Le, MD MBA The Palliative Medicine Team at [facility] Phone: [phone] Pager: [phone]

## 2013-04-17 NOTE — Progress Notes (Addendum)
Clinical Social Work Department CLINICAL SOCIAL WORK PLACEMENT NOTE 04/17/2013  Patient:  Ashley West, Ashley West  Account Number:  [id number] Admit date:  04/15/2013  Clinical Social Worker:  Ulyess Blossom  Date/time:  04/17/2013 10:00 AM  Clinical Social Work is seeking post-discharge placement for this patient at the following level of care:   [place]   (*CSW will update this form in Epic as items are completed)   04/17/2013  Patient/family provided with [place] Department of Clinical Social Work's list of facilities offering this level of care within the geographic area requested by the patient (or if unable, by the patient's family).  04/17/2013  Patient/family informed of their freedom to choose among providers that offer the needed level of care, that participate in Medicare, Medicaid or managed care program needed by the patient, have an available bed and are willing to accept the patient.  04/17/2013  Patient/family informed of [facility]' ownership interest in [facility], as well as of the fact that they are under no obligation to receive care at this facility.  PASARR submitted to EDS on 04/17/2013 PASARR number received from EDS on 04/17/2013  FL2 transmitted to all facilities in geographic area requested by pt/family on  04/17/2013 FL2 transmitted to all facilities within larger geographic area on   Patient informed that his/her managed care company has contracts with or will negotiate with  certain facilities, including the following:     Patient/family informed of bed offers received:  04/17/2013 Patient chooses bed at [facility] Physician recommends and patient chooses bed at    Patient to be transferred to  on  [facility] on 04/19/2013 Patient to be transferred to facility by ambulance [facility])  The following physician request were entered in Epic:   Additional Comments:    Alison Murray, MSW,  [place] Work [phone]

## 2013-04-17 NOTE — Progress Notes (Signed)
[facility] PROGRESS NOTE  Ashley West ZOX:[id number] DOB: [birth year] DOA: 04/15/2013 PCP: [name], MD  Assessment/Plan: 73 y.o. 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 Patient was evaluated by Dr. Ellene Route, neurosurgery, he felt that the patient was not a good candidate for surgery at this time.  1. Bilateral lower extremity weakness Due to significant tumor regrowth within the spinal canal and significant cord compression. S/p laminectomy from T5-T8 to decompress epidural tumor 02/22/13  -Patient discussed options with Dr. Ellene Route, neurosurgery, patient was deemed not a candidate for surgery at this time. On dexamethasone 10 mg. Patient still refusing chemo;  -on radiation therapy. Likely need palliative care, and hospice upon discharge  2. Diffuse large B-cell lymphoma Patient continues to decline chemotherapy; f/u palliative care    3. Hypertension Continue antihypertensive medications. Stable.  4. Alcohol abuse no h/o or s/s of withdrawal; Counseled on cessation. Continue home multivitamin and B12.  5. Generalized weakness Request physical therapy and occupational therapy evaluation; SNF     Code Status: DNR Family Communication: d/w patient (indicate person spoken with, relationship, and if by phone, the number) Disposition Plan: SNF vs home with hospice pend     Consultants:  Oncology  palliatyive care   Procedures:  None   Antibiotics:  None  (indicate start date, and stop date if known)  HPI/Subjective: aler  Objective: Filed Vitals:   04/17/13 0532  BP:  152/67  Pulse: 67  Temp: 98.2 F (36.8 C)  Resp: 16    Intake/Output Summary (Last 24 hours) at 04/17/13 0930 Last data filed at 04/17/13 0532  Gross per 24 hour  Intake   1070 ml  Output    200 ml  Net    870 ml   Filed Weights   04/16/13 0500 04/17/13 0532  Weight: 55.9 kg (123 lb 3.8 oz) 55.8 kg (123 lb 0.3 oz)    Exam:   General:  alert  Cardiovascular: s1,s2 rrr  Respiratory: CTA BL  Abdomen: soft, nt,nd   Musculoskeletal: no LE edema   Data Reviewed: Basic Metabolic Panel:  Recent Labs Lab 04/15/13 1312 04/16/13 0402  NA 137 136*  K 3.9 4.4  CL 98 102  CO2 24 23  GLUCOSE 96 192*  BUN 13 16  CREATININE 0.80 0.85  CALCIUM 11.2* 11.2*   Liver Function Tests:  Recent Labs Lab 04/15/13 1312  AST 19  ALT 26  ALKPHOS 102  BILITOT 0.4  PROT 6.9  ALBUMIN 3.7   No results found for this basename: LIPASE, AMYLASE,  in the last 168 hours No results found for this basename: AMMONIA,  in the last 168 hours CBC:  Recent Labs Lab 04/15/13 1312 04/16/13 0402  WBC 5.8 8.3  NEUTROABS 4.6  --   HGB 11.0* 10.8*  HCT 34.5* 34.7*  MCV 82.1 82.6  PLT 454* 466*   Cardiac Enzymes: No results found for this basename: CKTOTAL, CKMB, CKMBINDEX, TROPONINI,  in the last 168 hours BNP (last 3 results) No results found for this basename: PROBNP,  in the last 8760 hours CBG: No results found for this basename: GLUCAP,  in the last 168 hours  No results  found for this or any previous visit (from the past 240 hour(s)).   Studies: Mr Thoracic Spine W Wo Contrast  04/15/2013   CLINICAL DATA:  Status post decompression for intraspinal tumor, representing lymphoma. Increasing inability to walk.  EXAM: MRI THORACIC SPINE WITHOUT AND WITH CONTRAST  TECHNIQUE: Multiplanar and multiecho pulse sequences of the thoracic spine were obtained without and with intravenous contrast.  CONTRAST:  73mL MULTIHANCE GADOBENATE DIMEGLUMINE 529 MG/ML IV SOLN  COMPARISON:  CT CHEST  W/CM dated 03/02/2013; MR T SPINE WO/W CM dated 02/22/2013; MR L SPINE WO/W CM dated 02/22/2013  FINDINGS: There has been thoracic decompression from the T5 through T7 levels 02/22/2013. Significant tumor regrowth within the spinal canal has occurred. There is significant cord compression with abnormal cord signal implying ischemia or infarction. No definite intramedullary tumor. Compression of the spinal cord is greater from the right than left. Some ventral tumor at these levels is also present. A large right and small left pleural effusion is evident, new from prior chest CT. Widespread osseous metastatic disease is seen elsewhere in the thoracic vertebral bodies without frank pathologic compression deformity.  IMPRESSION: Significant tumor regrowth within the spinal canal has occurred since prior thoracic decompression. There is significant cord compression with abnormal cord signal, implying ischemia or infarction.  Findings discussed with ordering provider.   Electronically Signed   By: Rolla Flatten M.D.   On: 04/15/2013 19:53   Mr Lumbar Spine W Wo Contrast  04/15/2013   CLINICAL DATA:  Widespread lymphoma, status post thoracic decompression now with increasing inability to walk.  EXAM: MRI LUMBAR SPINE WITHOUT AND WITH CONTRAST  TECHNIQUE: Multiplanar and multiecho pulse sequences of the lumbar spine were obtained without and with intravenous contrast.  CONTRAST:  31mL MULTIHANCE GADOBENATE DIMEGLUMINE 529 MG/ML IV SOLN  COMPARISON:  CT ABD/PELVIS W CM dated 03/02/2013; MR L SPINE WO/W CM dated 02/22/2013. Thoracic MRI without and with contrast reported separately.  FINDINGS: There is widespread osseous disease to the lumbar spine. The greatest involvement remains in the S1 vertebral body. No intraspinal tumor is evident. The conus is unaffected. There is no significant disc protrusion. There may be mild retroperitoneal adenopathy but not of a bulky nature. Slight presacral fluid of uncertain significance.  Bladder not grossly distended. Osseous disease noted in the iliac bones and lower sacrum as well.  IMPRESSION: Widespread osseous disease to the lumbar spine similar to that noted 02/22/2013. No intraspinal tumor is evident. There is no cauda equina compression.   Electronically Signed   By: Rolla Flatten M.D.   On: 04/15/2013 20:00    Scheduled Meds: . dexamethasone  10 mg Intravenous 4 times per day  . enoxaparin (LOVENOX) injection  40 mg Subcutaneous Q24H  . folic acid-pyridoxine-cyancobalamin  1 tablet Oral Daily  . hydrochlorothiazide  12.5 mg Oral Daily  . losartan  50 mg Oral Daily  . multivitamin with minerals  1 tablet Oral Daily  . pantoprazole  40 mg Oral Daily  . polyethylene glycol  17 g Oral Daily  . vitamin C  500 mg Oral Daily  . vitamin E  400 Units Oral Daily   Continuous Infusions:   Active Problems:   HTN (hypertension)   Alcohol abuse   Anemia   Diffuse large B cell lymphoma   Weakness of both lower extremities   Paraplegia   Weakness of lower extremity    Time spent: >35 minutes     Kinnie Feil  [facility] Pager [phone].  If 7PM-7AM, please contact night-coverage at [email or web address], password [place] 04/17/2013, 9:30 AM  LOS: 2 days

## 2013-04-18 ENCOUNTER — Telehealth: Payer: Self-pay | Admitting: Radiation Oncology

## 2013-04-18 ENCOUNTER — Ambulatory Visit
Admit: 2013-04-18 | Discharge: 2013-04-18 | Disposition: A | Discharge status: Home / Self Care | Payer: Medicare HMO | Attending: Radiation Oncology | Admitting: Radiation Oncology

## 2013-04-18 NOTE — Progress Notes (Signed)
Physical Therapy Treatment Patient Details Name: Ashley West MRN: [id number] DOB: 1940-07-11 Today's Date: 04/18/2013    History of Present Illness Pt is a 73 year old female presenting with stage IV diffuse large B-cell lymphoma causing cord compression at T5-T7 resulting in weakness and parasthesia of the trunk and LE (paraplegia), undergoing radiation therapy; PMH includes prior thoracic laminectomy T5-T8 to decompress epidural tumor 02/22/13 (temporary relief of LE weakness and parasthesia), HTN, alcohol abuse, and anemia    PT Comments    Pt performed LE exercises and reported she had been performing them 2-3 times daily.  Pt instructed on sliding board transfer to wheelchair; pt anxious about trunk control and falling.  Required min assist to manage and block LE throughout transfers.  Therapist explained parts of wheelchair and gave instructions on turning and propulsion.  Pt able to self-propel in hallway and transfer back to bed.  She reports discharge to SNF tomorrow.  Follow Up Recommendations  SNF     Equipment Recommendations  Wheelchair (measurements PT);Hospital bed    Recommendations for Other Services       Precautions / Restrictions Precautions Precautions: Fall;Back Restrictions Weight Bearing Restrictions: No    Mobility  Bed Mobility Overal bed mobility: Needs Assistance Bed Mobility: Rolling;Sidelying to Sit;Sit to Sidelying Rolling: Min assist Sidelying to sit: Min assist     Sit to sidelying: Min assist General bed mobility comments: verbal cues for sequence and safety; min assist during rolling to bring hips over in line with shoulders to maintain straight back; min assist to manage LE during mobility  Transfers Overall transfer level: Needs assistance Equipment used:  (sliding board) Transfers: Lateral/Scoot Transfers          Lateral/Scoot Transfers: Min assist;With slide board General transfer comment: verbal cues for sequence and safety,  multiple cues for hand placement to let go of bed rail and place hand in safe position for transfer; pt very anxious about falling off bed and sliding board despite guarding and knees blocked; pt managed UE and trunk but required assist to control LE and prevent sliding forward  Ambulation/Gait                 Hotel manager mobility: Yes Wheelchair propulsion: Both upper extremities Wheelchair parts: Supervision/cueing Distance: 100 Wheelchair Assistance Details (indicate cue type and reason): Pt had never propelled a WC; explanation of parts of wheelchair and instruction for brakes; cuing for turning and straight propulsion  Modified Rankin (Stroke Patients Only)       Balance     Sitting balance-Leahy Scale: Poor Sitting balance - Comments: pt reports she must hold on with UE and have support behind her in sitting to stay upright; poor trunk control                            Cognition Arousal/Alertness: Awake/alert Behavior During Therapy: WFL for tasks assessed/performed Overall Cognitive Status: Within Functional Limits for tasks assessed                      Exercises General Exercises - Lower Extremity Quad Sets: AROM;Both;10 reps;Supine Gluteal Sets: AROM;Both;10 reps;Supine Heel Slides: AAROM;Both;15 reps;Supine;Limitations Heel Slides Limitations: unable to keep leg in midline, requried support on lateral knee Hip ABduction/ADduction: Both;10 reps;AROM;Supine Straight Leg Raises: AAROM;Both;5 reps;Supine    General Comments  Pertinent Vitals/Pain Activity to tolerance.  No complaints of pain.    Home Living                      Prior Function            PT Goals (current goals can now be found in the care plan section) Acute Rehab PT Goals Patient Stated Goal: to not be so weak, would like to walk but also OK with learning how to be mobile in a  wheelchair PT Goal Formulation: With patient Time For Goal Achievement: 04/23/13 Potential to Achieve Goals: Fair Progress towards PT goals: Progressing toward goals    Frequency  Min 3X/week    PT Plan Current plan remains appropriate    Co-evaluation             End of Session Equipment Utilized During Treatment: Other (comment) (sliding board) Activity Tolerance: Patient tolerated treatment well Patient left: in bed;with call bell/phone within reach;with family/visitor present     Time: 4627-0350 PT Time Calculation (min): 32 min  Charges:  $Therapeutic Exercise: 8-22 mins $Wheel Chair Management: 8-22 mins                    G Codes:      Jacqulyn Cane Apr 27, 2013, 3:38 PM Jacqulyn Cane SPT April 27, 2013

## 2013-04-18 NOTE — Progress Notes (Signed)
I have reviewed this note and agree with all findings. Kati Najat Olazabal, PT, DPT Pager: [phone]   

## 2013-04-18 NOTE — Progress Notes (Signed)
[facility] Therapy Treatment Record Phone [phone]   Radiation Therapy was administered to Ashley West on: 04/18/2013  10:55 AM and was treatment # 3 out of a planned course of 10 treatments.

## 2013-04-18 NOTE — Telephone Encounter (Signed)
Understand from Faith, RT that this patient may possibly be discharged to [facility] today. Contacted Velva Harman, in house case manager for patient. Provided her with patient's radiation schedule so transportation could be arranged with [facility] prior to discharge ensuring continuity of care.

## 2013-04-18 NOTE — Progress Notes (Signed)
[facility] PROGRESS NOTE  Ashley West OJJ:[id number] DOB: Mar 29, 1940 DOA: 04/15/2013 PCP: Ashley Coffin, MD  Assessment/Plan: 73 y.o. 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 Patient was evaluated by Dr. Ellene Route, neurosurgery, he felt that the patient was not a good candidate for surgery at this time. Receiving palliative radiation. She refuses chemotherapy. Plan for SNF.  1. Bilateral lower extremity weakness Due to significant tumor regrowth within the spinal canal and significant cord compression. S/p laminectomy from T5-T8 to decompress epidural tumor 02/22/13  -Patient discussed options with Dr. Ellene Route, neurosurgery, patient was deemed not a candidate for surgery at this time. On dexamethasone 10 mg. Patient still refusing chemo;  -on radiation therapy. Refuses hospice care. Agreeable to SNF.  2. Diffuse large B-cell lymphoma Patient continues to decline chemotherapy.   3. Hypertension Continue antihypertensive medications. Stable.   4. Alcohol abuse no h/o or s/s of withdrawal; Counseled on cessation. Continue home multivitamin and B12.   5. Generalized weakness/Paraplegia -For SNF once bed available.    Code Status: DNR Family Communication: Sister Ashley West at bedside updated on plan of care. Disposition Plan: SNF pending insurance authorization.   Consultants:  Oncology  Palliative Care   Procedures:  None   Antibiotics:  None   HPI/Subjective: Eating lunch, no complaints.  Objective: Filed Vitals:   04/18/13 0544  BP: 156/69  Pulse: 59  Temp: 98.1 F (36.7 C)   Resp: 20    Intake/Output Summary (Last 24 hours) at 04/18/13 1246 Last data filed at 04/18/13 1205  Gross per 24 hour  Intake    480 ml  Output    200 ml  Net    280 ml   Filed Weights   04/16/13 0500 04/17/13 0532 04/18/13 0544  Weight: 55.9 kg (123 lb 3.8 oz) 55.8 kg (123 lb 0.3 oz) 55.792 kg (123 lb)    Exam:   General:  alert  Cardiovascular: s1,s2 rrr  Respiratory: CTA BL  Abdomen: soft, nt,nd   Musculoskeletal: no LE edema   Data Reviewed: Basic Metabolic Panel:  Recent Labs Lab 04/15/13 1312 04/16/13 0402  NA 137 136*  K 3.9 4.4  CL 98 102  CO2 24 23  GLUCOSE 96 192*  BUN 13 16  CREATININE 0.80 0.85  CALCIUM 11.2* 11.2*   Liver Function Tests:  Recent Labs Lab 04/15/13 1312  AST 19  ALT 26  ALKPHOS 102  BILITOT 0.4  PROT 6.9  ALBUMIN 3.7   No results found for this basename: LIPASE, AMYLASE,  in the last 168 hours No results found for this basename: AMMONIA,  in the last 168 hours CBC:  Recent Labs Lab 04/15/13 1312 04/16/13 0402 04/17/13 1245  WBC 5.8 8.3 20.6*  NEUTROABS 4.6  --  19.0*  HGB 11.0* 10.8* 11.0*  HCT 34.5* 34.7* 34.4*  MCV 82.1 82.6 80.8  PLT 454* 466* 456*   Cardiac Enzymes: No results found for this basename: CKTOTAL, CKMB, CKMBINDEX, TROPONINI,  in the last 168 hours BNP (last 3 results) No results found for this basename: PROBNP,  in the last 8760 hours CBG: No results found for this basename: GLUCAP,  in the last 168 hours  No results found for this or any previous visit (from the past 240 hour(s)).   Studies: No results found.  Scheduled Meds: .  ceFAZolin (ANCEF) IV  2 g Intravenous Once  . dexamethasone  10 mg Intravenous 4 times per day  . enoxaparin (LOVENOX) injection  40 mg Subcutaneous Q24H  . folic acid-pyridoxine-cyancobalamin  1 tablet Oral Daily  . hydrochlorothiazide  12.5 mg Oral Daily  . losartan  50 mg Oral Daily  . multivitamin with minerals  1 tablet Oral Daily  . pantoprazole   40 mg Oral Daily  . polyethylene glycol  17 g Oral Daily  . senna-docusate  1 tablet Oral BID  . vitamin C  500 mg Oral Daily  . vitamin E  400 Units Oral Daily   Continuous Infusions: . sodium chloride      Active Problems:   HTN (hypertension)   Alcohol abuse   Anemia   Diffuse large B cell lymphoma   Weakness of both lower extremities   Paraplegia   Weakness of lower extremity    Time spent: 35 minutes     [facility] Pager [phone]. If 7PM-7AM, please contact night-coverage at [email or web address], password [facility] 04/18/2013, 12:46 PM  LOS: 3 days

## 2013-04-18 NOTE — Progress Notes (Signed)
CSW continuing to follow.  CSW checked with [facility] multiple times today and facility had not yet received insurance authorization from [facility]. Facility hopeful insurance authorization will be received tomorrow.  CSW updated MD.  CSW to facilitate pt discharge needs when insurance authorization received.  Alison Murray, MSW, [place] [employer] [phone]

## 2013-04-19 ENCOUNTER — Ambulatory Visit
Admit: 2013-04-19 | Discharge: 2013-04-19 | Disposition: A | Discharge status: Home / Self Care | Payer: Medicare HMO | Attending: Radiation Oncology | Admitting: Radiation Oncology

## 2013-04-19 DIAGNOSIS — J9600 Acute respiratory failure, unspecified whether with hypoxia or hypercapnia: Secondary | ICD-10-CM

## 2013-04-19 MED ORDER — DEXAMETHASONE 2 MG PO TABS: 10.0000 mg | ORAL_TABLET | Freq: Two times a day (BID) | ORAL | Status: DC

## 2013-04-19 NOTE — Progress Notes (Signed)
[facility] Therapy Treatment Record Phone [phone]   Radiation Therapy was administered to Ashley West on: 04/19/2013  1:31 PM and was treatment # 4 out of a planned course of 10 treatments.

## 2013-04-19 NOTE — Discharge Summary (Signed)
Physician Discharge Summary  Ashley West CWC:[id number] DOB: 27-Mar-1940 DOA: 04/15/2013  PCP: [name], MD  Admit date: 04/15/2013 Discharge date: 04/19/2013  Time spent: 45 minutes  Recommendations for Outpatient Follow-up:  -Will be discharged to SNF today.   Discharge Diagnoses:  Active Problems:   HTN (hypertension)   Alcohol abuse   Anemia   Diffuse large B cell lymphoma   Weakness of both lower extremities   Paraplegia   Weakness of lower extremity   Discharge Condition: Stable  Filed Weights   04/17/13 0532 04/18/13 0544 04/19/13 0601  Weight: 55.8 kg (123 lb 0.3 oz) 55.792 kg (123 lb) 56 kg (123 lb 7.3 oz)    History of present illness:  Ashley West is a 73 y.o. 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 Since her diagnosis in February, she is followed Dr. Alvy Bimler, she has been offered chemotherapy and possible radiation, patient has declined chemotherapy. Since her surgery in February, she has been mobile using a walker at home. About one and half weeks ago, she started noticing that her legs were weak bilaterally, and she had difficulty walking. Over the last 3 days, her walking has deteriorated significantly as such her husband had to carry her to the bathroom. She presented to the emergency department for further evaluation. MRI of her thoracic spine and lumbar spine showed significant tumor regrowth in the thoracic spinal canal with significant cord compression and abnormal cord signal. Patient was evaluated by Dr. Ellene Route, neurosurgery, he felt that the patient was not a good candidate for surgery at this time. Hospitalist service was asked to admit the patient for further care and management. She denies any recent fevers, chills,  nausea, vomiting, chest pain, shortness of breath, abdominal pain, diarrhea, headaches or vision changes. Patient denies any bowel or bladder incontinence at this time. Hospitalist admission was requested.   Hospital Course:   1. Bilateral lower extremity weakness Due to significant tumor regrowth within the spinal canal and significant cord compression. S/p laminectomy from T5-T8 to decompress epidural tumor 02/22/13  -Patient discussed options with Dr. Ellene Route, neurosurgery, patient was deemed not a candidate for surgery at this time. On dexamethasone 10 mg. Patient still refusing chemo;  -on radiation therapy. Has 6 treatments remaining on DC. Refuses hospice care. Agreeable to SNF.   2. Diffuse large B-cell lymphoma Patient continues to decline chemotherapy.   3. Hypertension Continue antihypertensive medications. Stable.   4. Alcohol abuse no h/o or s/s of withdrawal; Counseled on cessation. Continue home multivitamin and B12.   5. Generalized weakness/Paraplegia -For SNF once bed available.      Procedures:  Radiation    Consultations:  Rad onc  Discharge Instructions  Discharge Orders   Future Appointments Provider Department Dept Phone   04/19/2013 3:50 PM [facility] [phone]   04/20/2013 3:40 PM [facility] [phone]   04/20/2013 4:00 PM Lora Paula, MD [facility] [phone]   04/23/2013 3:20 PM [facility] [phone]   04/24/2013 3:50 PM [facility] [phone]   04/25/2013 1:30 PM [facility] [phone]   04/26/2013 12:30 PM [facility] [phone]   04/27/2013 4:50 PM [facility] [phone]   Future Orders Complete By Expires   Diet - low sodium heart healthy  As directed    Discontinue IV  As directed    Increase activity slowly  As directed        Medication List         b complex vitamins tablet  Take 1 tablet by mouth daily.     dexamethasone 2 MG tablet  Commonly known as:  DECADRON  Take 5 tablets (10 mg total) by mouth 2 (two) times daily with a meal. Take 10 mg every 4 hours     diclofenac 75 MG EC tablet  Commonly known as:  VOLTAREN  Take 75 mg by mouth 2 (two) times daily as needed (pain).     losartan-hydrochlorothiazide 50-12.5 MG per tablet  Commonly known as:  HYZAAR  Take 1 tablet by mouth every morning.     multivitamin with minerals Tabs tablet  Take 1 tablet by mouth daily.     omeprazole 20 MG capsule  Commonly known as:  PRILOSEC  Take 20 mg by mouth daily.     polyethylene glycol packet  Commonly known as:  MIRALAX / GLYCOLAX  Take 17 g by mouth daily.     Potassium 99 MG Tabs  Take 1 tablet by mouth daily.     promethazine 12.5 MG tablet  Commonly known as:  PHENERGAN  Take 2 tablets (25 mg total) by mouth every 6 (six) hours as needed for nausea.     sodium chloride 0.65 % Soln nasal spray  Commonly known as:  OCEAN  Place 2-3 sprays into both nostrils daily as needed for congestion.     vitamin C 500 MG tablet  Commonly known as:  ASCORBIC ACID  Take 500 mg by mouth daily.     vitamin E 400 UNIT capsule  Generic drug:  vitamin E  Take 400 Units by mouth daily.       No Known Allergies    The results of significant diagnostics from this hospitalization (including imaging, microbiology, ancillary and laboratory) are listed below for reference.    Significant Diagnostic Studies: Mr Thoracic Spine W Wo Contrast  04/15/2013   CLINICAL DATA:  Status post decompression for intraspinal tumor, representing lymphoma. Increasing inability to walk.  EXAM: MRI THORACIC SPINE  WITHOUT AND WITH CONTRAST  TECHNIQUE: Multiplanar and multiecho pulse sequences of the thoracic spine were obtained without and with intravenous contrast.  CONTRAST:  50mL MULTIHANCE GADOBENATE DIMEGLUMINE 529 MG/ML IV SOLN  COMPARISON:  CT CHEST W/CM dated 03/02/2013; MR T SPINE WO/W CM dated 02/22/2013; MR L SPINE WO/W CM dated 02/22/2013  FINDINGS: There has been thoracic decompression from the T5 through T7 levels 02/22/2013. Significant tumor regrowth within the spinal canal has occurred. There is significant cord compression with abnormal cord signal implying ischemia or infarction. No definite intramedullary tumor. Compression of the spinal cord is greater from the right than left. Some ventral tumor at these levels is also present. A large right and small left pleural effusion is evident, new from prior chest CT. Widespread osseous metastatic disease is seen elsewhere in the thoracic vertebral bodies without frank pathologic compression deformity.  IMPRESSION: Significant tumor regrowth within the spinal canal has occurred since prior thoracic decompression. There is significant cord compression with abnormal cord signal, implying ischemia or infarction.  Findings discussed with ordering provider.  Electronically Signed   By: Rolla Flatten M.D.   On: 04/15/2013 19:53   Mr Lumbar Spine W Wo Contrast  04/15/2013   CLINICAL DATA:  Widespread lymphoma, status post thoracic decompression now with increasing inability to walk.  EXAM: MRI LUMBAR SPINE WITHOUT AND WITH CONTRAST  TECHNIQUE: Multiplanar and multiecho pulse sequences of the lumbar spine were obtained without and with intravenous contrast.  CONTRAST:  84mL MULTIHANCE GADOBENATE DIMEGLUMINE 529 MG/ML IV SOLN  COMPARISON:  CT ABD/PELVIS W CM dated 03/02/2013; MR L SPINE WO/W CM dated 02/22/2013. Thoracic MRI without and with contrast reported separately.  FINDINGS: There is widespread osseous disease to the lumbar spine. The greatest involvement remains in  the S1 vertebral body. No intraspinal tumor is evident. The conus is unaffected. There is no significant disc protrusion. There may be mild retroperitoneal adenopathy but not of a bulky nature. Slight presacral fluid of uncertain significance. Bladder not grossly distended. Osseous disease noted in the iliac bones and lower sacrum as well.  IMPRESSION: Widespread osseous disease to the lumbar spine similar to that noted 02/22/2013. No intraspinal tumor is evident. There is no cauda equina compression.   Electronically Signed   By: Rolla Flatten M.D.   On: 04/15/2013 20:00    Microbiology: No results found for this or any previous visit (from the past 240 hour(s)).   Labs: Basic Metabolic Panel:  Recent Labs Lab 04/15/13 1312 04/16/13 0402  NA 137 136*  K 3.9 4.4  CL 98 102  CO2 24 23  GLUCOSE 96 192*  BUN 13 16  CREATININE 0.80 0.85  CALCIUM 11.2* 11.2*   Liver Function Tests:  Recent Labs Lab 04/15/13 1312  AST 19  ALT 26  ALKPHOS 102  BILITOT 0.4  PROT 6.9  ALBUMIN 3.7   No results found for this basename: LIPASE, AMYLASE,  in the last 168 hours No results found for this basename: AMMONIA,  in the last 168 hours CBC:  Recent Labs Lab 04/15/13 1312 04/16/13 0402 04/17/13 1245  WBC 5.8 8.3 20.6*  NEUTROABS 4.6  --  19.0*  HGB 11.0* 10.8* 11.0*  HCT 34.5* 34.7* 34.4*  MCV 82.1 82.6 80.8  PLT 454* 466* 456*   Cardiac Enzymes: No results found for this basename: CKTOTAL, CKMB, CKMBINDEX, TROPONINI,  in the last 168 hours BNP: BNP (last 3 results) No results found for this basename: PROBNP,  in the last 8760 hours CBG: No results found for this basename: GLUCAP,  in the last 168 hours     Signed:  [facility] Pager: [phone] 04/19/2013, 11:34 AM

## 2013-04-19 NOTE — Progress Notes (Addendum)
Pt for discharge to [facility]. CSW received notification from facility that [facility] authorization received.  CSW facilitated pt discharge needs including contacting facility, faxing pt discharge information via [facility], discussing with pt at bedside, providing RN phone number to call report, and arranging ambulance transport for pt to [facility] via [facility].  No further social work needs identified at this time.  CSW signing off.  Alison Murray, MSW, [facility] [phone]

## 2013-04-19 NOTE — Progress Notes (Signed)
Report called to [place] at [facility].

## 2013-04-20 ENCOUNTER — Non-Acute Institutional Stay (SKILLED_NURSING_FACILITY): Payer: Medicare HMO | Admitting: Internal Medicine

## 2013-04-20 ENCOUNTER — Encounter: Payer: Medicare HMO | Admitting: Radiation Oncology

## 2013-04-20 ENCOUNTER — Ambulatory Visit: Payer: Medicare HMO

## 2013-04-20 DIAGNOSIS — C8589 Other specified types of non-Hodgkin lymphoma, extranodal and solid organ sites: Secondary | ICD-10-CM

## 2013-04-20 DIAGNOSIS — R29898 Other symptoms and signs involving the musculoskeletal system: Secondary | ICD-10-CM

## 2013-04-20 DIAGNOSIS — C8330 Diffuse large B-cell lymphoma, unspecified site: Secondary | ICD-10-CM

## 2013-04-20 DIAGNOSIS — G959 Disease of spinal cord, unspecified: Secondary | ICD-10-CM

## 2013-04-20 DIAGNOSIS — G9520 Unspecified cord compression: Secondary | ICD-10-CM

## 2013-04-20 DIAGNOSIS — I10 Essential (primary) hypertension: Secondary | ICD-10-CM

## 2013-04-20 NOTE — Progress Notes (Signed)
Patient ID: Ashley West, female   DOB: 03-17-1940, 73 y.o.   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 MRI of her thoracic spine and lumbar spine showed significant tumor regrowth in the thoracic spinal canal with significant cord compression and abnormal cord signal. Neurosurgery was consulted and patient was not thought to be a good candidate for surgery given her medical conditions. Medical management was opted and physical therapy was recommended. Patient has been working with therapy team. She complaints of constipation and mentions that getting miralax in the evening helps her. She would like some prune juice for now. No other concerns.   Review of Systems:  Constitutional: Negative for fever, chills. Has fatigue and malaise HENT: Negative for congestion, hearing loss and sore throat.   Eyes: Negative for eye pain, blurred vision, double vision and discharge.  Respiratory: Negative for cough, sputum production, shortness of breath and wheezing.   Cardiovascular: Negative for chest pain, palpitations, orthopnea  Gastrointestinal: Negative for heartburn, nausea, vomiting, abdominal pain. Has constipation.  Genitourinary: Negative for dysuria, urgency, frequency, hematuria and flank pain.  Musculoskeletal: positive for back pain Skin: Negative for itching and rash.  Neurological: Negative for dizziness, tingling, focal weakness and headaches.  Psychiatric/Behavioral: Negative for depression and memory loss. The patient is not nervous/anxious.     Past Medical History  Diagnosis Date    . Hypertension   . Diffuse large B cell lymphoma   . Hiatal hernia   . GERD (gastroesophageal reflux disease)   . Weakness of both lower extremities 03/08/2013   Past Surgical History  Procedure Laterality Date  . Abdominal hysterectomy    . Cataract extraction      Bilateral  . Cyst excision      Left Elbow, Arm and back  . Esophagogastroduodenoscopy N/A 04/20/2012    Procedure: ESOPHAGOGASTRODUODENOSCOPY (EGD);  Surgeon: Cleotis Nipper, MD;  Location: [facility] END[facility]COPY;  Service: Endoscopy;  Laterality: N/A;  . Laminectomy N/A 02/22/2013    Procedure: THORACIC LAMINECTOMY FOR TUMOR;  Surgeon: Erline Levine, MD;  Location: [place] [place] NEURO ORS;  Service: Neurosurgery;  Laterality: N/A;   Social History:   reports that she has never smoked. She has never used smokeless tobacco. She reports that she does not drink alcohol or use illicit drugs.  Family History  Problem Relation Age of Onset  . Breast cancer Sister   . Cancer Sister     uterine v/s ovarina?    Medications: Patient's Medications  New Prescriptions   No medications on file  Previous Medications   B COMPLEX VITAMINS TABLET    Take 1 tablet by mouth daily.   DEXAMETHASONE (DECADRON) 2 MG TABLET    Take 5 tablets (10 mg total) by mouth 2 (two) times daily with a meal. Take 10 mg every 4 hours   DICLOFENAC (VOLTAREN) 75 MG EC TABLET    Take 75 mg by mouth 2 (two) times daily as needed (pain).   LOSARTAN-HYDROCHLOROTHIAZIDE (HYZAAR) 50-12.5 MG PER TABLET    Take 1 tablet by mouth every morning.    MULTIPLE VITAMIN (MULTIVITAMIN WITH MINERALS) TABS    Take  1 tablet by mouth daily.   OMEPRAZOLE (PRILOSEC) 20 MG CAPSULE    Take 20 mg by mouth daily.   POLYETHYLENE GLYCOL (MIRALAX / GLYCOLAX) PACKET    Take 17 g by mouth daily.   POTASSIUM 99 MG TABS    Take 1 tablet by mouth daily.   PROMETHAZINE (PHENERGAN) 12.5 MG TABLET    Take 2 tablets (25 mg total) by mouth every 6 (six) hours as needed for nausea.   SODIUM CHLORIDE (OCEAN)  0.65 % SOLN NASAL SPRAY    Place 2-3 sprays into both nostrils daily as needed for congestion.   VITAMIN C (ASCORBIC ACID) 500 MG TABLET    Take 500 mg by mouth daily.   VITAMIN E (VITAMIN E) 400 UNIT CAPSULE    Take 400 Units by mouth daily.  Modified Medications   No medications on file  Discontinued Medications   No medications on file    Physical Exam: BP 142/75  Pulse 68  Temp(Src) 97.9 F (36.6 C)  Resp 18  SpO2 95%  General- elderly female in no acute distress Head- atraumatic, normocephalic Eyes- PERRLA, EOMI, no pallor, no icterus, no discharge Neck- no lymphadenopathy Throat- moist mucus membrane Nose- normal nasal mucosa, no maxillary or frontal sinus tenderness Cardiovascular- normal s1,s2, no murmurs/ rubs/ gallops Respiratory- bilateral clear to auscultation, no wheeze, no rhonchi, no crackles, no use of accessory muscles Abdomen- bowel sounds present, soft, non tender Musculoskeletal- able to move all 4 extremities, on wheelchair, no edema, lower extremity weakness > upper extremity weakness Neurological- no focal deficit Skin- warm and dry Psychiatry- alert and oriented to person, place and time, normal mood and affect   Labs reviewed: Basic Metabolic Panel:  Recent Labs  02/25/13 0335 03/12/13 1503 04/15/13 1312 04/16/13 0402  NA 137 135* 137 136*  K 4.5 4.0 3.9 4.4  CL 101  --  98 102  CO2 24 24 24 23   GLUCOSE 93 122 96 192*  BUN 8 13.5 13 16   CREATININE 0.87 0.9 0.80 0.85  CALCIUM 10.4 11.4* 11.2* 11.2*   Liver Function Tests:  Recent Labs  03/12/13 1503 04/15/13 1312  AST 23 19  ALT 31 26  ALKPHOS 105 102  BILITOT 0.29 0.4  PROT 6.9 6.9  ALBUMIN 3.7 3.7   No results found for this basename: LIPASE, AMYLASE,  in the last 8760 hours No results found for this basename: AMMONIA,  in the last 8760 hours CBC:  Recent Labs  03/12/13 1503 04/15/13 1312 04/16/13 0402 04/17/13 1245  WBC 7.2 5.8 8.3 20.6*  NEUTROABS 5.8 4.6  --  19.0*   HGB 9.3* 11.0* 10.8* 11.0*  HCT 28.7* 34.5* 34.7* 34.4*  MCV 82.2 82.1 82.6 80.8  PLT 545* 454* 466* 456*   Cardiac Enzymes: No results found for this basename: CKTOTAL, CKMB, CKMBINDEX, TROPONINI,  in the last 8760 hours BNP: No components found with this basename: POCBNP,  CBG:  Recent Labs  02/26/13 1126  GLUCAP 95    Radiological Exams: Mr Thoracic Spine W Wo Contrast  04/15/2013   CLINICAL DATA:  Status post decompression for intraspinal tumor, representing lymphoma. Increasing inability to walk.  EXAM: MRI THORACIC SPINE WITHOUT AND WITH CONTRAST  TECHNIQUE: Multiplanar and multiecho pulse sequences of the thoracic spine were obtained without and with intravenous contrast.  CONTRAST:  18mL MULTIHANCE GADOBENATE DIMEGLUMINE 529 MG/ML IV SOLN  COMPARISON:  CT CHEST W/CM dated 03/02/2013; MR T SPINE WO/W CM dated 02/22/2013; MR L SPINE WO/W CM  dated 02/22/2013  FINDINGS: There has been thoracic decompression from the T5 through T7 levels 02/22/2013. Significant tumor regrowth within the spinal canal has occurred. There is significant cord compression with abnormal cord signal implying ischemia or infarction. No definite intramedullary tumor. Compression of the spinal cord is greater from the right than left. Some ventral tumor at these levels is also present. A large right and small left pleural effusion is evident, new from prior chest CT. Widespread osseous metastatic disease is seen elsewhere in the thoracic vertebral bodies without frank pathologic compression deformity.  IMPRESSION: Significant tumor regrowth within the spinal canal has occurred since prior thoracic decompression. There is significant cord compression with abnormal cord signal, implying ischemia or infarction.  Findings discussed with ordering provider.   Electronically Signed   By: Rolla Flatten M.D.   On: 04/15/2013 19:53   Mr Lumbar Spine W Wo Contrast  04/15/2013   CLINICAL DATA:  Widespread lymphoma, status post  thoracic decompression now with increasing inability to walk.  EXAM: MRI LUMBAR SPINE WITHOUT AND WITH CONTRAST  TECHNIQUE: Multiplanar and multiecho pulse sequences of the lumbar spine were obtained without and with intravenous contrast.  CONTRAST:  61mL MULTIHANCE GADOBENATE DIMEGLUMINE 529 MG/ML IV SOLN  COMPARISON:  CT ABD/PELVIS W CM dated 03/02/2013; MR L SPINE WO/W CM dated 02/22/2013. Thoracic MRI without and with contrast reported separately.  FINDINGS: There is widespread osseous disease to the lumbar spine. The greatest involvement remains in the S1 vertebral body. No intraspinal tumor is evident. The conus is unaffected. There is no significant disc protrusion. There may be mild retroperitoneal adenopathy but not of a bulky nature. Slight presacral fluid of uncertain significance. Bladder not grossly distended. Osseous disease noted in the iliac bones and lower sacrum as well.  IMPRESSION: Widespread osseous disease to the lumbar spine similar to that noted 02/22/2013. No intraspinal tumor is evident. There is no cauda equina compression.   Electronically Signed   By: Rolla Flatten M.D.   On: 04/15/2013 20:00    Assessment/Plan  Bilateral lower extremity weakness- tumor regrowth leading to cord compression causing this weakness. Continue dexamethasone 10 mg bid. Patient has been refusing chemotherapy. Will have her work with PT team as tolerated for gait training and muscle strengthening exercises. Continue voltaren bid for pain. reasess for need for adjustment of pain medication  Epidural tumor with cord compression- has radiation therapy schedule 04/23/13 at 3:20 pm. Will need transportation set up for this. Continue dexamethasone 10 mg bid for now  Diffuse large B-cell lymphoma- Patient continues to decline chemotherapy.   Hypertension- Continue hyzaar and monitor bp   GERD- continue prilosec 20 mg daily  Constipation- prn warm prune juice and will change miralax to once a day at  bedtime  Family/ staff Communication: reviewed care plan with patient and nursing supervisor   Goals of care: STR   Labs/tests ordered: cbc, cmp    Blanchie Serve, MD  [facility] [phone] (Monday-Friday 8 am - 5 pm) [phone] (afterhours)

## 2013-04-22 ENCOUNTER — Encounter: Payer: Self-pay | Admitting: Internal Medicine

## 2013-04-23 ENCOUNTER — Ambulatory Visit
Admit: 2013-04-23 | Discharge: 2013-04-23 | Disposition: A | Discharge status: Home / Self Care | Payer: Medicare HMO | Attending: Radiation Oncology | Admitting: Radiation Oncology

## 2013-04-23 ENCOUNTER — Encounter: Payer: Self-pay | Admitting: Radiation Oncology

## 2013-04-23 ENCOUNTER — Ambulatory Visit
Admission: RE | Admit: 2013-04-23 | Discharge: 2013-04-23 | Disposition: A | Discharge status: Home / Self Care | Payer: Medicare HMO | Source: Ambulatory Visit | Attending: Radiation Oncology | Admitting: Radiation Oncology

## 2013-04-23 VITALS — BP 160/72 | HR 73 | Resp 16

## 2013-04-23 DIAGNOSIS — G8220 Paraplegia, unspecified: Secondary | ICD-10-CM

## 2013-04-23 DIAGNOSIS — C8330 Diffuse large B-cell lymphoma, unspecified site: Secondary | ICD-10-CM

## 2013-04-23 NOTE — Progress Notes (Signed)
  Radiation Oncology         [phone] ________________________________  Name: ONNIE ALATORRE MRN: [id number]  Date: 04/23/2013  DOB: 17-Dec-1940  Weekly Radiation Therapy Management  Current Dose: 15 Gy     Planned Dose:  30 Gy  Narrative . . . . . . . . The patient presents for routine under treatment assessment.                                   Patient now a resident of [place]. Patient being pushed in wheelchair. Patient denies pain at this time. Patient states, "that decadron is making me hungry I have gained 4 lb." Patient unsure of decadron 10 mg BID. Denies nausea, vomiting, headache or dizziness                                 Set-up films were reviewed.                                 The chart was checked. Physical Findings. . .  blood pressure is 160/72 and her pulse is 73. Her respiration is 16. . Weight essentially stable.  No significant changes.  LE strength improving 4-5-/5 Impression . . . . . . . The patient is tolerating radiation. Plan . . . . . . . . . . . . Continue treatment as planned.  ________________________________  Sheral Apley. Tammi Klippel, M.D.

## 2013-04-23 NOTE — Progress Notes (Signed)
Patient now a resident of [place]. Patient being pushed in wheelchair. Patient denies pain at this time. Patient states, "that decadron is making me hungry I have gained 4 lb." Patient unsure of decadron strength or frequency. Denies nausea, vomiting, headache or dizziness.   Oriented patient to staff and routine of the clinic. Provided patient with RADIATION THERAPY AND YOU handbook then, reviewed pertinent information. Provided this patient with business card and encouraged to call with needs.

## 2013-04-24 ENCOUNTER — Ambulatory Visit
Admit: 2013-04-24 | Discharge: 2013-04-24 | Disposition: A | Discharge status: Home / Self Care | Payer: Medicare HMO | Attending: Radiation Oncology | Admitting: Radiation Oncology

## 2013-04-25 ENCOUNTER — Ambulatory Visit
Admit: 2013-04-25 | Discharge: 2013-04-25 | Disposition: A | Discharge status: Home / Self Care | Payer: Medicare HMO | Attending: Radiation Oncology | Admitting: Radiation Oncology

## 2013-04-25 ENCOUNTER — Encounter: Payer: Self-pay | Admitting: Radiation Oncology

## 2013-04-25 NOTE — Progress Notes (Addendum)
  Radiation Oncology         [phone] ________________________________  Name: Ashley West  MRN: [id number]  Date: 04/26/2013  DOB: 09-14-1940  Weekly Radiation Therapy Management  Current Dose: 24 Gy     Planned Dose:  30 Gy  Narrative . . . . . . . . The patient presents for routine under treatment assessment.                                   no c/o pain, eating well, unable to stand to get weighed, drinking plenty water and 1 cup coffee in am                                 Set-up films were reviewed.                                 The chart was checked. Physical Findings. . .  oral temperature is 98.5 F (36.9 C). Her blood pressure is 184/65 and her pulse is 65. Her respiration is 20. . Weight essentially stable.  No significant changes.  MS 4+/5 LE today. Impression . . . . . . . The patient is tolerating radiation. Plan . . . . . . . . . . . . Continue treatment as planned.  Given 30-day steroid taper instructions today  ________________________________  Sheral Apley. Tammi Klippel, M.D.

## 2013-04-26 ENCOUNTER — Ambulatory Visit
Admission: RE | Admit: 2013-04-26 | Discharge: 2013-04-26 | Disposition: A | Discharge status: Home / Self Care | Payer: Medicare HMO | Source: Ambulatory Visit | Attending: Radiation Oncology | Admitting: Radiation Oncology

## 2013-04-26 ENCOUNTER — Encounter: Payer: Self-pay | Admitting: Radiation Oncology

## 2013-04-26 ENCOUNTER — Ambulatory Visit
Admit: 2013-04-26 | Discharge: 2013-04-26 | Disposition: A | Discharge status: Home / Self Care | Payer: Medicare HMO | Attending: Radiation Oncology | Admitting: Radiation Oncology

## 2013-04-26 VITALS — BP 184/65 | HR 65 | Temp 98.5°F | Resp 20

## 2013-04-26 DIAGNOSIS — C8330 Diffuse large B-cell lymphoma, unspecified site: Secondary | ICD-10-CM

## 2013-04-26 NOTE — Progress Notes (Signed)
Weekly rad tx 8/10 completed T4-T8, no c/o pain, eating well, unable to stand to get weighed, drinking plenty water and 1 cup coffee in am 12:56 PM

## 2013-04-27 ENCOUNTER — Ambulatory Visit
Admission: RE | Admit: 2013-04-27 | Discharge: 2013-04-27 | Disposition: A | Discharge status: Home / Self Care | Payer: Medicare HMO | Source: Ambulatory Visit | Attending: Radiation Oncology | Admitting: Radiation Oncology

## 2013-04-27 ENCOUNTER — Ambulatory Visit: Payer: Medicare HMO

## 2013-04-30 ENCOUNTER — Ambulatory Visit
Admission: RE | Admit: 2013-04-30 | Discharge: 2013-04-30 | Disposition: A | Discharge status: Home / Self Care | Payer: Medicare HMO | Source: Ambulatory Visit | Attending: Radiation Oncology | Admitting: Radiation Oncology

## 2013-04-30 ENCOUNTER — Encounter: Payer: Self-pay | Admitting: Radiation Oncology

## 2013-04-30 ENCOUNTER — Ambulatory Visit: Payer: Medicare HMO

## 2013-04-30 DIAGNOSIS — C8330 Diffuse large B-cell lymphoma, unspecified site: Secondary | ICD-10-CM

## 2013-04-30 NOTE — Progress Notes (Signed)
  Radiation Oncology         [phone] ________________________________  Name: Ashley West MRN: [id number]  Date: 04/30/2013  DOB: 05/26/1940  Weekly Radiation Therapy Management  Current Dose: 30 Gy     Planned Dose:  30 Gy  Narrative . . . . . . . . The patient presents for routine under treatment assessment.                                   The patient is without complaint except for swelling in both lower extremities particularly the knee area. This is likely related to her steroid medication.                                 Set-up films were reviewed.                                 The chart was checked. Physical Findings. . .  vitals were not taken for this visit.. The lungs are clear.  Swelling is noted in both the areas in particular the popliteal fossa. No palpable cords in either leg Impression . . . . . . . The patient is tolerated radiation well. Plan . . . . . . . . . . . Marland Kitchen routine followup with Dr. Tammi Klippel in one month. The patient was given  Decadron taper instructions last week. ________________________________   Blair Promise, PhD, MD

## 2013-04-30 NOTE — Progress Notes (Signed)
Ashley West completes radiation therapy to her T4-T8 spine.  She comes to nursing with concerns regarding swelling of her interior knees with mild swelling of the ankles. Calfs without edema.  Negative for Homan's sign.  Denies any pain today.

## 2013-05-02 ENCOUNTER — Telehealth: Payer: Self-pay | Admitting: Radiation Oncology

## 2013-05-02 NOTE — Telephone Encounter (Signed)
Faxed decadron taper order to Raynelle Highland @ [facility]. Fax confirmation of delivery obtained. Also, spoke with Raynelle Highland via phone to confirm she received the order and had no questions. She confirms yes.

## 2013-05-07 NOTE — Progress Notes (Signed)
  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  Narrative: The patient tolerated radiation treatment relatively well.   Her leg strength improved modestly during radiation to the point of some assisted weight bearing.  Plan: The patient has completed radiation treatment. The patient will return to radiation oncology clinic for routine followup in one month. I advised them to call or return sooner if they have any questions or concerns related to their recovery or treatment. ________________________________  Sheral Apley. Tammi Klippel, M.D.

## 2013-05-15 DIAGNOSIS — C8589 Other specified types of non-Hodgkin lymphoma, extranodal and solid organ sites: Secondary | ICD-10-CM | POA: Diagnosis not present

## 2013-05-15 DIAGNOSIS — C720 Malignant neoplasm of spinal cord: Secondary | ICD-10-CM | POA: Diagnosis not present

## 2013-05-15 DIAGNOSIS — G8220 Paraplegia, unspecified: Secondary | ICD-10-CM | POA: Diagnosis not present

## 2013-05-15 DIAGNOSIS — L8992 Pressure ulcer of unspecified site, stage 2: Secondary | ICD-10-CM | POA: Diagnosis not present

## 2013-05-15 DIAGNOSIS — R630 Anorexia: Secondary | ICD-10-CM | POA: Diagnosis not present

## 2013-05-15 DIAGNOSIS — E46 Unspecified protein-calorie malnutrition: Secondary | ICD-10-CM | POA: Diagnosis not present

## 2013-05-16 DIAGNOSIS — C720 Malignant neoplasm of spinal cord: Secondary | ICD-10-CM | POA: Diagnosis not present

## 2013-05-16 DIAGNOSIS — L8992 Pressure ulcer of unspecified site, stage 2: Secondary | ICD-10-CM | POA: Diagnosis not present

## 2013-05-16 DIAGNOSIS — G8220 Paraplegia, unspecified: Secondary | ICD-10-CM | POA: Diagnosis not present

## 2013-05-16 DIAGNOSIS — R630 Anorexia: Secondary | ICD-10-CM | POA: Diagnosis not present

## 2013-05-16 DIAGNOSIS — C8589 Other specified types of non-Hodgkin lymphoma, extranodal and solid organ sites: Secondary | ICD-10-CM | POA: Diagnosis not present

## 2013-05-16 DIAGNOSIS — E46 Unspecified protein-calorie malnutrition: Secondary | ICD-10-CM | POA: Diagnosis not present

## 2013-05-17 DIAGNOSIS — R630 Anorexia: Secondary | ICD-10-CM | POA: Diagnosis not present

## 2013-05-17 DIAGNOSIS — L8992 Pressure ulcer of unspecified site, stage 2: Secondary | ICD-10-CM | POA: Diagnosis not present

## 2013-05-17 DIAGNOSIS — C720 Malignant neoplasm of spinal cord: Secondary | ICD-10-CM | POA: Diagnosis not present

## 2013-05-17 DIAGNOSIS — G8220 Paraplegia, unspecified: Secondary | ICD-10-CM | POA: Diagnosis not present

## 2013-05-17 DIAGNOSIS — C8589 Other specified types of non-Hodgkin lymphoma, extranodal and solid organ sites: Secondary | ICD-10-CM | POA: Diagnosis not present

## 2013-05-17 DIAGNOSIS — E46 Unspecified protein-calorie malnutrition: Secondary | ICD-10-CM | POA: Diagnosis not present

## 2013-05-21 DIAGNOSIS — R630 Anorexia: Secondary | ICD-10-CM | POA: Diagnosis not present

## 2013-05-21 DIAGNOSIS — E46 Unspecified protein-calorie malnutrition: Secondary | ICD-10-CM | POA: Diagnosis not present

## 2013-05-21 DIAGNOSIS — C8589 Other specified types of non-Hodgkin lymphoma, extranodal and solid organ sites: Secondary | ICD-10-CM | POA: Diagnosis not present

## 2013-05-21 DIAGNOSIS — G8220 Paraplegia, unspecified: Secondary | ICD-10-CM | POA: Diagnosis not present

## 2013-05-21 DIAGNOSIS — L8992 Pressure ulcer of unspecified site, stage 2: Secondary | ICD-10-CM | POA: Diagnosis not present

## 2013-05-21 DIAGNOSIS — C720 Malignant neoplasm of spinal cord: Secondary | ICD-10-CM | POA: Diagnosis not present

## 2013-05-22 ENCOUNTER — Other Ambulatory Visit: Payer: Self-pay | Admitting: *Deleted

## 2013-05-22 MED ORDER — HYDROCODONE-ACETAMINOPHEN 5-325 MG PO TABS: ORAL_TABLET | ORAL | Status: DC

## 2013-05-22 NOTE — Telephone Encounter (Signed)
[facility] 

## 2013-05-23 ENCOUNTER — Other Ambulatory Visit: Payer: Self-pay | Admitting: *Deleted

## 2013-05-23 DIAGNOSIS — F3289 Other specified depressive episodes: Secondary | ICD-10-CM | POA: Diagnosis not present

## 2013-05-23 DIAGNOSIS — F329 Major depressive disorder, single episode, unspecified: Secondary | ICD-10-CM | POA: Diagnosis not present

## 2013-05-23 MED ORDER — HYDROCODONE-ACETAMINOPHEN 5-325 MG PO TABS: ORAL_TABLET | ORAL | Status: DC

## 2013-05-23 NOTE — Telephone Encounter (Signed)
[facility]  

## 2013-05-24 ENCOUNTER — Other Ambulatory Visit: Payer: Self-pay | Admitting: *Deleted

## 2013-05-24 ENCOUNTER — Ambulatory Visit
Admission: RE | Admit: 2013-05-24 | Discharge: 2013-05-24 | Disposition: A | Discharge status: Home / Self Care | Payer: Medicare HMO | Source: Ambulatory Visit | Attending: Radiation Oncology | Admitting: Radiation Oncology

## 2013-05-24 ENCOUNTER — Encounter: Payer: Self-pay | Admitting: Radiation Oncology

## 2013-05-24 ENCOUNTER — Telehealth: Payer: Self-pay | Admitting: Radiation Oncology

## 2013-05-24 VITALS — BP 141/68 | HR 83 | Temp 98.2°F | Ht 60.0 in

## 2013-05-24 DIAGNOSIS — C8330 Diffuse large B-cell lymphoma, unspecified site: Secondary | ICD-10-CM

## 2013-05-24 MED ORDER — HYDROCODONE-ACETAMINOPHEN 5-325 MG PO TABS: ORAL_TABLET | ORAL | Status: DC

## 2013-05-24 NOTE — Progress Notes (Signed)
Ashley West here today for assessment s/p radiation therapy to her Thoracic spine which completed on 04/30/13.  She is traveling by wheelchair today and accompanied by her spouse and another family member.  She denies any pain today.  Unable to weigh.  She continues to refuse to bath and her spouse has a written request , per the care facility, for a written order for her to bath and allow staff to check her skin, because the staff state she will comply if here is an order.

## 2013-05-24 NOTE — Progress Notes (Signed)
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  She is traveling by wheelchair today and accompanied by her spouse and another family member. She denies any pain today. Unable to weigh. She continues to refuse to bath and her spouse has a written request , per the care facility, for a written order for her to bath and allow staff to check her skin, because the staff state she will comply if here is an order.  Her leg strength was beginning to improve during her radiation treatment, but, has now worsened.                               ALLERGIES:  has No Known Allergies.  Meds: Current Outpatient Prescriptions  Medication Sig Dispense Refill  . b complex vitamins tablet Take 1 tablet by mouth daily.      Marland Kitchen dexamethasone (DECADRON) 2 MG tablet Take 2 mg by mouth daily.      . diclofenac (VOLTAREN) 75 MG EC tablet Take 75 mg by mouth 2 (two) times daily as needed (pain).      Marland Kitchen losartan-hydrochlorothiazide (HYZAAR) 50-12.5 MG per tablet Take 1 tablet by mouth every morning.       . Multiple Vitamin (MULTIVITAMIN WITH MINERALS) TABS Take 1 tablet by mouth daily.      Marland Kitchen omeprazole (PRILOSEC) 20 MG capsule Take 20 mg by mouth daily.      . polyethylene glycol (MIRALAX / GLYCOLAX) packet Take 17 g by mouth daily.      . Potassium 99 MG TABS Take 1 tablet by mouth daily. 10 meg po once daily      . promethazine (PHENERGAN) 12.5 MG tablet Take 2 tablets (25 mg total) by  mouth every 6 (six) hours as needed for nausea.  30 tablet  3  . sodium chloride (OCEAN) 0.65 % SOLN nasal spray Place 1 spray into both nostrils as needed for congestion.      . vitamin C (ASCORBIC ACID) 500 MG tablet Take 500 mg by mouth daily.      . vitamin E (VITAMIN E) 400 UNIT capsule Take 400 Units by mouth daily.      Marland Kitchen HYDROcodone-acetaminophen (NORCO/VICODIN) 5-325 MG per tablet Take one tablet by mouth every 4 hours as needed for pain  18 tablet  0   No current facility-administered medications for this encounter.    Physical Findings: The patient is in no acute distress. Patient is alert and oriented.  height is 5' (1.524 m). Her temperature is 98.2 F (36.8 C). Her blood pressure is 141/68 and her pulse is 83. .  Unable to move legs.  Some intact light touch in LE's  Affect is withdrawn and mood seems aggravated but cooperative.  Lab Findings: Lab Results  Component Value Date   WBC 20.6* 04/17/2013   HGB 11.0* 04/17/2013   HCT 34.4* 04/17/2013   MCV 80.8 04/17/2013  PLT 456* 04/17/2013    Impression:  The patient is recovering from the effects of radiation.  Our attempt to stave off paralysis in the lower extremities was not successful.  Plan:  At the patient's request, no further systemic therapy will be recommended.  I would not recommend additional imaging or radiotherapy at this time.  Continued comfort care with hospice is appropriate.  _____________________________________  Sheral Apley. Tammi Klippel, M.D.

## 2013-05-24 NOTE — Telephone Encounter (Signed)
Received call from [facility]. She expresses concern that the patient will not allow them to wash her with soap or apply deodorant because she had radiation. Explained she has complete radiation therapy thus, there are no restrictions with soap or deodorant at this time. Shelly verbalized, "we understand that but, the patient doesn't." She request our staff discuss this with her today because she "is odorous." Expressed appreciation for the call and confirmed the staff will educate her about hygiene today.

## 2013-05-24 NOTE — Telephone Encounter (Signed)
[facility] 

## 2013-05-28 ENCOUNTER — Other Ambulatory Visit: Payer: Self-pay | Admitting: *Deleted

## 2013-05-31 ENCOUNTER — Ambulatory Visit: Payer: Medicare HMO | Admitting: Radiation Oncology

## 2013-06-01 DIAGNOSIS — R630 Anorexia: Secondary | ICD-10-CM | POA: Diagnosis not present

## 2013-06-01 DIAGNOSIS — C720 Malignant neoplasm of spinal cord: Secondary | ICD-10-CM | POA: Diagnosis not present

## 2013-06-01 DIAGNOSIS — L8992 Pressure ulcer of unspecified site, stage 2: Secondary | ICD-10-CM | POA: Diagnosis not present

## 2013-06-01 DIAGNOSIS — C8589 Other specified types of non-Hodgkin lymphoma, extranodal and solid organ sites: Secondary | ICD-10-CM | POA: Diagnosis not present

## 2013-06-01 DIAGNOSIS — E46 Unspecified protein-calorie malnutrition: Secondary | ICD-10-CM | POA: Diagnosis not present

## 2013-06-01 DIAGNOSIS — G8220 Paraplegia, unspecified: Secondary | ICD-10-CM | POA: Diagnosis not present

## 2013-06-06 DIAGNOSIS — E46 Unspecified protein-calorie malnutrition: Secondary | ICD-10-CM | POA: Diagnosis not present

## 2013-06-06 DIAGNOSIS — G8220 Paraplegia, unspecified: Secondary | ICD-10-CM | POA: Diagnosis not present

## 2013-06-06 DIAGNOSIS — R630 Anorexia: Secondary | ICD-10-CM | POA: Diagnosis not present

## 2013-06-06 DIAGNOSIS — L8992 Pressure ulcer of unspecified site, stage 2: Secondary | ICD-10-CM | POA: Diagnosis not present

## 2013-06-06 DIAGNOSIS — C720 Malignant neoplasm of spinal cord: Secondary | ICD-10-CM | POA: Diagnosis not present

## 2013-06-06 DIAGNOSIS — C8589 Other specified types of non-Hodgkin lymphoma, extranodal and solid organ sites: Secondary | ICD-10-CM | POA: Diagnosis not present

## 2013-06-11 DIAGNOSIS — C8589 Other specified types of non-Hodgkin lymphoma, extranodal and solid organ sites: Secondary | ICD-10-CM | POA: Diagnosis not present

## 2013-06-11 DIAGNOSIS — C720 Malignant neoplasm of spinal cord: Secondary | ICD-10-CM | POA: Diagnosis not present

## 2013-06-11 DIAGNOSIS — L8992 Pressure ulcer of unspecified site, stage 2: Secondary | ICD-10-CM | POA: Diagnosis not present

## 2013-06-11 DIAGNOSIS — R630 Anorexia: Secondary | ICD-10-CM | POA: Diagnosis not present

## 2013-06-11 DIAGNOSIS — G8220 Paraplegia, unspecified: Secondary | ICD-10-CM | POA: Diagnosis not present

## 2013-06-11 DIAGNOSIS — E46 Unspecified protein-calorie malnutrition: Secondary | ICD-10-CM | POA: Diagnosis not present

## 2013-06-14 DIAGNOSIS — F329 Major depressive disorder, single episode, unspecified: Secondary | ICD-10-CM | POA: Diagnosis not present

## 2013-06-14 DIAGNOSIS — F3289 Other specified depressive episodes: Secondary | ICD-10-CM | POA: Diagnosis not present

## 2013-06-26 ENCOUNTER — Non-Acute Institutional Stay (SKILLED_NURSING_FACILITY): Payer: Medicare Other | Admitting: Internal Medicine

## 2013-06-26 DIAGNOSIS — G959 Disease of spinal cord, unspecified: Secondary | ICD-10-CM | POA: Diagnosis not present

## 2013-06-26 DIAGNOSIS — G9520 Unspecified cord compression: Secondary | ICD-10-CM

## 2013-06-26 DIAGNOSIS — R29898 Other symptoms and signs involving the musculoskeletal system: Secondary | ICD-10-CM | POA: Diagnosis not present

## 2013-06-26 DIAGNOSIS — C8330 Diffuse large B-cell lymphoma, unspecified site: Secondary | ICD-10-CM

## 2013-06-26 DIAGNOSIS — C8589 Other specified types of non-Hodgkin lymphoma, extranodal and solid organ sites: Secondary | ICD-10-CM

## 2013-06-26 NOTE — Progress Notes (Signed)
Patient ID: Ashley West, female   DOB: June 14, 1940, 73 y.o.   MRN: [id number]  Location:  [facility]  Provider:  Rexene Edison. Mariea Clonts, D.O., C.M.D.  Code Status:   DNR  Chief Complaint  Patient presents with  . Medical Management of Chronic Issues    Routine Visit     HPI:  73 yo female here for short term rehab with h/o alcohol abuse, paraplegia, GERD, htn, generalized weakness, hiatal hernia, diverticulosis and large b cell lymphoma seen for med mgt of her chronic illnesses.  She is receiving hospice care since last month for her thoracic spine T5-7 large b cell lymphoma for which she already had radiation therapy to her thoracic spine.  She refuses to bathe unless she sees the order written out on paper.  No additional curative therapies are recommended for her--the radiation oncologist agrees with comfort care and hospice.  Review of Systems:  Review of Systems  Constitutional: Positive for weight loss and malaise/fatigue. Negative for fever and chills.  HENT: Negative for congestion.   Respiratory: Negative for shortness of breath.   Cardiovascular: Negative for chest pain.  Gastrointestinal: Positive for nausea. Negative for abdominal pain.  Genitourinary: Negative for dysuria.  Musculoskeletal: Negative for falls.  Skin: Negative for rash.  Neurological: Positive for weakness. Negative for dizziness.  Endo/Heme/Allergies: Bruises/bleeds easily.  Psychiatric/Behavioral: Positive for depression and memory loss.    Medications: Patient's Medications  New Prescriptions   No medications on file  Previous Medications   B COMPLEX VITAMINS TABLET    Take 1 tablet by mouth daily.   DICLOFENAC (VOLTAREN) 75 MG EC TABLET    Take 75 mg by mouth 2 (two) times daily as needed (pain).   HYDROCODONE-ACETAMINOPHEN (NORCO/VICODIN) 5-325 MG PER TABLET    Take one tablet by mouth every 4 hours as needed for pain   LOSARTAN-HYDROCHLOROTHIAZIDE (HYZAAR) 50-12.5 MG PER TABLET     Take 1 tablet by mouth every morning.    MULTIPLE VITAMIN (MULTIVITAMIN WITH MINERALS) TABS    Take 1 tablet by mouth daily.   OMEPRAZOLE (PRILOSEC) 20 MG CAPSULE    Take 20 mg by mouth daily.   POLYETHYLENE GLYCOL (MIRALAX / GLYCOLAX) PACKET    Take 17 g by mouth daily.   POTASSIUM CHLORIDE (K-DUR) 10 MEQ TABLET    Take 10 mEq by mouth daily.   PROMETHAZINE (PHENERGAN) 25 MG TABLET    Take 25 mg by mouth every 6 (six) hours as needed for nausea or vomiting.   SERTRALINE (ZOLOFT) 50 MG TABLET    Take 50 mg by mouth daily.   SODIUM CHLORIDE (OCEAN) 0.65 % SOLN NASAL SPRAY    Place 2 sprays into both nostrils as needed for congestion.    VITAMIN C (ASCORBIC ACID) 500 MG TABLET    Take 500 mg by mouth daily.   VITAMIN E (VITAMIN E) 400 UNIT CAPSULE    Take 400 Units by mouth daily.   WOUND DRESSINGS (HYDROGEL) GEL    Apply topically. Apply to buttock coccxy topically every day shift for wound  Modified Medications   No medications on file  Discontinued Medications   DEXAMETHASONE (DECADRON) 2 MG TABLET    Take 2 mg by mouth daily.   POTASSIUM 99 MG TABS    Take 1 tablet by mouth daily. 10 meg po once daily   PROMETHAZINE (PHENERGAN) 12.5 MG TABLET    Take 2 tablets (25 mg total) by mouth every 6 (six) hours as needed  for nausea.    Physical Exam: Filed Vitals:   06/26/13 1457  BP: 134/62  Pulse: 82  Temp: 97.7 F (36.5 C)  TempSrc: Oral  Resp: 16  Weight: 126 lb (57.153 kg)  Physical Exam  Constitutional: No distress.  Cardiovascular: Normal rate, regular rhythm, normal heart sounds and intact distal pulses.   Pulmonary/Chest: Effort normal and breath sounds normal.  Abdominal: Soft. Bowel sounds are normal.  Neurological: She is alert.  Skin: Skin is warm and dry.  Psychiatric:  Flat affect    Labs reviewed: Basic Metabolic Panel:  Recent Labs  02/25/13 0335 03/12/13 1503 04/15/13 1312 04/16/13 0402  NA 137 135* 137 136*  K 4.5 4.0 3.9 4.4  CL 101  --  98 102  CO2 24  24 24 23   GLUCOSE 93 122 96 192*  BUN 8 13.5 13 16   CREATININE 0.87 0.9 0.80 0.85  CALCIUM 10.4 11.4* 11.2* 11.2*    Liver Function Tests:  Recent Labs  03/12/13 1503 04/15/13 1312  AST 23 19  ALT 31 26  ALKPHOS 105 102  BILITOT 0.29 0.4  PROT 6.9 6.9  ALBUMIN 3.7 3.7    CBC:  Recent Labs  03/12/13 1503 04/15/13 1312 04/16/13 0402 04/17/13 1245  WBC 7.2 5.8 8.3 20.6*  NEUTROABS 5.8 4.6  --  19.0*  HGB 9.3* 11.0* 10.8* 11.0*  HCT 28.7* 34.5* 34.7* 34.4*  MCV 82.2 82.1 82.6 80.8  PLT 545* 454* 466* 456*  Labs 04/23/13:  Na 132, K 4.8, BUN 29, cr 0.81, Ca 10.6, glucose 122, wbc 10.3, h/h 110.8/32.7, plts 421  Assessment/Plan 1. Diffuse large B cell lymphoma -cont hospice care -she does not c/o pain -is on bowel regimen, antidepressant and nausea medications 2. Weakness of both lower extremities -due 3 from 1 -has had XRT to help shrink tumor burden, steroids, but strength has not returned 3. Compression of spinal cord with myelopathy -due to #1 -she is now on hospice care and appears comfortable  Family/ staff Communication: seen with unit supervisor Goals of care: comfort until death

## 2013-07-06 ENCOUNTER — Encounter: Payer: Self-pay | Admitting: Internal Medicine

## 2013-07-06 NOTE — Progress Notes (Signed)
Patient ID: Ashley West, female   DOB: [birth year], 73 y.o.   MRN: [id number]    Facility: [facility]  Chief complaint- discharge visit  Allergies reviewed  HPI 73 Y/o patient is seen today for discharge visit. Patient was here for short term rehabilitation and has worked with therapy team. 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   Review of system Constitutional: Negative for fever, chills HENT: Negative for congestion.  Respiratory: Negative for cough, sputum production, shortness of breath and wheezing.   Cardiovascular: Negative for chest pain, palpitations, leg swelling.  Gastrointestinal: Negative for heartburn, nausea, vomiting, abdominal pain, constipation Genitourinary: Negative for dysuria Musculoskeletal: Negative for back pain, falls Skin: Negative for itching and rash.  Neurological: Negative for dizziness, tingling, focal weakness and headaches.  Psychiatric/Behavioral: Negative for depression   Medication reviewed. See [facility] Current Outpatient Prescriptions on File Prior to Visit  Medication Sig Dispense Refill  . b complex vitamins tablet Take 1 tablet by mouth daily.      . diclofenac (VOLTAREN) 75 MG EC tablet Take 75 mg by mouth 2 (two) times daily as needed (pain).      Marland Kitchen HYDROcodone-acetaminophen (NORCO/VICODIN) 5-325 MG per tablet Take one tablet by mouth every 4 hours as needed for pain  18 tablet  0  . losartan-hydrochlorothiazide (HYZAAR) 50-12.5 MG per tablet Take 1 tablet by mouth every morning.       . Multiple Vitamin (MULTIVITAMIN WITH MINERALS) TABS Take 1 tablet by mouth daily.      Marland Kitchen omeprazole (PRILOSEC) 20 MG capsule Take 20 mg by mouth daily.      . polyethylene glycol (MIRALAX / GLYCOLAX) packet Take 17 g by mouth daily.      . potassium chloride (K-DUR) 10 MEQ tablet Take 10 mEq  by mouth daily.      . promethazine (PHENERGAN) 25 MG tablet Take 25 mg by mouth every 6 (six) hours as needed for nausea or vomiting.      . sertraline (ZOLOFT) 50 MG tablet Take 50 mg by mouth daily.      . sodium chloride (OCEAN) 0.65 % SOLN nasal spray Place 2 sprays into both nostrils as needed for congestion.       . vitamin C (ASCORBIC ACID) 500 MG tablet Take 500 mg by mouth daily.      . vitamin E (VITAMIN E) 400 UNIT capsule Take 400 Units by mouth daily.      . Wound Dressings (HYDROGEL) GEL Apply topically. Apply to buttock coccxy topically every day shift for wound       No current facility-administered medications on file prior to visit.     Physical exam BP 135/64  Pulse 88  Temp(Src) 96.4 F (35.8 C)  Resp 18  Ht 5' (1.524 m)  Wt 126 lb (57.153 kg)  BMI 24.61 kg/m2  General- elderly female in no acute distress Head- atraumatic, normocephalic Cardiovascular- normal s1,s2, no murmurs/ rubs/ gallops Respiratory- bilateral clear to auscultation, no wheeze, no rhonchi, no crackles Abdomen- bowel sounds present, soft, non tender Musculoskeletal- able to move all 4 extremities, no use of assistive device Neurological- no focal deficit Psychiatry- alert and oriented to person, place and time, normal mood and affect   Labs reviewed  Lab Results  Component Value Date   CREATININE 0.85 04/16/2013   Assessment/plan   DME required  PCP follow  up  30 days supply of prescription medications provided  Plan of care discussed with patient and verbalizes understanding this. Reviewed care plan with nursing staff    This encounter was created in error - please disregard.

## 2013-07-11 DIAGNOSIS — E46 Unspecified protein-calorie malnutrition: Secondary | ICD-10-CM | POA: Diagnosis not present

## 2013-07-11 DIAGNOSIS — C8589 Other specified types of non-Hodgkin lymphoma, extranodal and solid organ sites: Secondary | ICD-10-CM | POA: Diagnosis not present

## 2013-07-11 DIAGNOSIS — G8220 Paraplegia, unspecified: Secondary | ICD-10-CM | POA: Diagnosis not present

## 2013-07-11 DIAGNOSIS — C720 Malignant neoplasm of spinal cord: Secondary | ICD-10-CM | POA: Diagnosis not present

## 2013-07-11 DIAGNOSIS — R630 Anorexia: Secondary | ICD-10-CM | POA: Diagnosis not present

## 2013-07-11 DIAGNOSIS — L8992 Pressure ulcer of unspecified site, stage 2: Secondary | ICD-10-CM | POA: Diagnosis not present

## 2013-08-11 DIAGNOSIS — L8992 Pressure ulcer of unspecified site, stage 2: Secondary | ICD-10-CM | POA: Diagnosis not present

## 2013-08-11 DIAGNOSIS — C720 Malignant neoplasm of spinal cord: Secondary | ICD-10-CM | POA: Diagnosis not present

## 2013-08-11 DIAGNOSIS — C8589 Other specified types of non-Hodgkin lymphoma, extranodal and solid organ sites: Secondary | ICD-10-CM | POA: Diagnosis not present

## 2013-08-11 DIAGNOSIS — G8220 Paraplegia, unspecified: Secondary | ICD-10-CM | POA: Diagnosis not present

## 2013-08-11 DIAGNOSIS — R630 Anorexia: Secondary | ICD-10-CM | POA: Diagnosis not present

## 2013-08-11 DIAGNOSIS — E46 Unspecified protein-calorie malnutrition: Secondary | ICD-10-CM | POA: Diagnosis not present

## 2013-09-11 DIAGNOSIS — L8992 Pressure ulcer of unspecified site, stage 2: Secondary | ICD-10-CM | POA: Diagnosis not present

## 2013-09-11 DIAGNOSIS — C8589 Other specified types of non-Hodgkin lymphoma, extranodal and solid organ sites: Secondary | ICD-10-CM | POA: Diagnosis not present

## 2013-09-11 DIAGNOSIS — C720 Malignant neoplasm of spinal cord: Secondary | ICD-10-CM | POA: Diagnosis not present

## 2013-09-11 DIAGNOSIS — R630 Anorexia: Secondary | ICD-10-CM | POA: Diagnosis not present

## 2013-09-11 DIAGNOSIS — G8220 Paraplegia, unspecified: Secondary | ICD-10-CM | POA: Diagnosis not present

## 2013-09-11 DIAGNOSIS — E46 Unspecified protein-calorie malnutrition: Secondary | ICD-10-CM | POA: Diagnosis not present

## 2013-10-11 DIAGNOSIS — K219 Gastro-esophageal reflux disease without esophagitis: Secondary | ICD-10-CM | POA: Diagnosis not present

## 2013-10-11 DIAGNOSIS — C8599 Non-Hodgkin lymphoma, unspecified, extranodal and solid organ sites: Secondary | ICD-10-CM | POA: Diagnosis not present

## 2013-10-11 DIAGNOSIS — E46 Unspecified protein-calorie malnutrition: Secondary | ICD-10-CM | POA: Diagnosis not present

## 2013-10-11 DIAGNOSIS — R630 Anorexia: Secondary | ICD-10-CM | POA: Diagnosis not present

## 2013-10-11 DIAGNOSIS — L8992 Pressure ulcer of unspecified site, stage 2: Secondary | ICD-10-CM | POA: Diagnosis not present

## 2013-10-11 DIAGNOSIS — G8220 Paraplegia, unspecified: Secondary | ICD-10-CM | POA: Diagnosis not present

## 2013-10-11 DIAGNOSIS — I10 Essential (primary) hypertension: Secondary | ICD-10-CM | POA: Diagnosis not present

## 2013-10-11 DIAGNOSIS — C720 Malignant neoplasm of spinal cord: Secondary | ICD-10-CM | POA: Diagnosis not present

## 2013-10-16 DIAGNOSIS — C8590 Non-Hodgkin lymphoma, unspecified, unspecified site: Secondary | ICD-10-CM | POA: Diagnosis not present

## 2013-10-16 DIAGNOSIS — I10 Essential (primary) hypertension: Secondary | ICD-10-CM | POA: Diagnosis not present

## 2013-10-16 DIAGNOSIS — M150 Primary generalized (osteo)arthritis: Secondary | ICD-10-CM | POA: Diagnosis not present

## 2013-10-16 DIAGNOSIS — K219 Gastro-esophageal reflux disease without esophagitis: Secondary | ICD-10-CM | POA: Diagnosis not present

## 2013-11-11 DIAGNOSIS — G8220 Paraplegia, unspecified: Secondary | ICD-10-CM | POA: Diagnosis not present

## 2013-11-11 DIAGNOSIS — E46 Unspecified protein-calorie malnutrition: Secondary | ICD-10-CM | POA: Diagnosis not present

## 2013-11-11 DIAGNOSIS — C8599 Non-Hodgkin lymphoma, unspecified, extranodal and solid organ sites: Secondary | ICD-10-CM | POA: Diagnosis not present

## 2013-11-11 DIAGNOSIS — R630 Anorexia: Secondary | ICD-10-CM | POA: Diagnosis not present

## 2013-11-11 DIAGNOSIS — K219 Gastro-esophageal reflux disease without esophagitis: Secondary | ICD-10-CM | POA: Diagnosis not present

## 2013-11-11 DIAGNOSIS — I10 Essential (primary) hypertension: Secondary | ICD-10-CM | POA: Diagnosis not present

## 2013-11-11 DIAGNOSIS — C720 Malignant neoplasm of spinal cord: Secondary | ICD-10-CM | POA: Diagnosis not present

## 2013-12-11 DIAGNOSIS — E46 Unspecified protein-calorie malnutrition: Secondary | ICD-10-CM | POA: Diagnosis not present

## 2013-12-11 DIAGNOSIS — C720 Malignant neoplasm of spinal cord: Secondary | ICD-10-CM | POA: Diagnosis not present

## 2013-12-11 DIAGNOSIS — C8599 Non-Hodgkin lymphoma, unspecified, extranodal and solid organ sites: Secondary | ICD-10-CM | POA: Diagnosis not present

## 2013-12-11 DIAGNOSIS — I10 Essential (primary) hypertension: Secondary | ICD-10-CM | POA: Diagnosis not present

## 2013-12-11 DIAGNOSIS — R630 Anorexia: Secondary | ICD-10-CM | POA: Diagnosis not present

## 2013-12-11 DIAGNOSIS — G8220 Paraplegia, unspecified: Secondary | ICD-10-CM | POA: Diagnosis not present

## 2013-12-11 DIAGNOSIS — K219 Gastro-esophageal reflux disease without esophagitis: Secondary | ICD-10-CM | POA: Diagnosis not present

## 2013-12-23 ENCOUNTER — Encounter: Payer: Self-pay | Admitting: Internal Medicine

## 2014-01-11 DIAGNOSIS — I10 Essential (primary) hypertension: Secondary | ICD-10-CM | POA: Diagnosis not present

## 2014-01-11 DIAGNOSIS — C720 Malignant neoplasm of spinal cord: Secondary | ICD-10-CM | POA: Diagnosis not present

## 2014-01-11 DIAGNOSIS — R630 Anorexia: Secondary | ICD-10-CM | POA: Diagnosis not present

## 2014-01-11 DIAGNOSIS — C8599 Non-Hodgkin lymphoma, unspecified, extranodal and solid organ sites: Secondary | ICD-10-CM | POA: Diagnosis not present

## 2014-01-11 DIAGNOSIS — G8220 Paraplegia, unspecified: Secondary | ICD-10-CM | POA: Diagnosis not present

## 2014-01-11 DIAGNOSIS — E46 Unspecified protein-calorie malnutrition: Secondary | ICD-10-CM | POA: Diagnosis not present

## 2014-01-11 DIAGNOSIS — K219 Gastro-esophageal reflux disease without esophagitis: Secondary | ICD-10-CM | POA: Diagnosis not present

## 2014-02-11 DIAGNOSIS — K219 Gastro-esophageal reflux disease without esophagitis: Secondary | ICD-10-CM | POA: Diagnosis not present

## 2014-02-11 DIAGNOSIS — R630 Anorexia: Secondary | ICD-10-CM | POA: Diagnosis not present

## 2014-02-11 DIAGNOSIS — C720 Malignant neoplasm of spinal cord: Secondary | ICD-10-CM | POA: Diagnosis not present

## 2014-02-11 DIAGNOSIS — C8599 Non-Hodgkin lymphoma, unspecified, extranodal and solid organ sites: Secondary | ICD-10-CM | POA: Diagnosis not present

## 2014-02-11 DIAGNOSIS — E46 Unspecified protein-calorie malnutrition: Secondary | ICD-10-CM | POA: Diagnosis not present

## 2014-02-11 DIAGNOSIS — G8220 Paraplegia, unspecified: Secondary | ICD-10-CM | POA: Diagnosis not present

## 2014-02-11 DIAGNOSIS — I10 Essential (primary) hypertension: Secondary | ICD-10-CM | POA: Diagnosis not present

## 2014-03-12 DIAGNOSIS — I10 Essential (primary) hypertension: Secondary | ICD-10-CM | POA: Diagnosis not present

## 2014-03-12 DIAGNOSIS — E46 Unspecified protein-calorie malnutrition: Secondary | ICD-10-CM | POA: Diagnosis not present

## 2014-03-12 DIAGNOSIS — R630 Anorexia: Secondary | ICD-10-CM | POA: Diagnosis not present

## 2014-03-12 DIAGNOSIS — C720 Malignant neoplasm of spinal cord: Secondary | ICD-10-CM | POA: Diagnosis not present

## 2014-03-12 DIAGNOSIS — K219 Gastro-esophageal reflux disease without esophagitis: Secondary | ICD-10-CM | POA: Diagnosis not present

## 2014-03-12 DIAGNOSIS — G8220 Paraplegia, unspecified: Secondary | ICD-10-CM | POA: Diagnosis not present

## 2014-03-12 DIAGNOSIS — C8599 Non-Hodgkin lymphoma, unspecified, extranodal and solid organ sites: Secondary | ICD-10-CM | POA: Diagnosis not present

## 2014-04-12 DIAGNOSIS — C8599 Non-Hodgkin lymphoma, unspecified, extranodal and solid organ sites: Secondary | ICD-10-CM | POA: Diagnosis not present

## 2014-04-12 DIAGNOSIS — R630 Anorexia: Secondary | ICD-10-CM | POA: Diagnosis not present

## 2014-04-12 DIAGNOSIS — G8220 Paraplegia, unspecified: Secondary | ICD-10-CM | POA: Diagnosis not present

## 2014-04-12 DIAGNOSIS — C720 Malignant neoplasm of spinal cord: Secondary | ICD-10-CM | POA: Diagnosis not present

## 2014-04-12 DIAGNOSIS — K219 Gastro-esophageal reflux disease without esophagitis: Secondary | ICD-10-CM | POA: Diagnosis not present

## 2014-04-12 DIAGNOSIS — I10 Essential (primary) hypertension: Secondary | ICD-10-CM | POA: Diagnosis not present

## 2014-04-12 DIAGNOSIS — E46 Unspecified protein-calorie malnutrition: Secondary | ICD-10-CM | POA: Diagnosis not present

## 2014-04-17 DIAGNOSIS — I10 Essential (primary) hypertension: Secondary | ICD-10-CM | POA: Diagnosis not present

## 2014-04-17 DIAGNOSIS — K219 Gastro-esophageal reflux disease without esophagitis: Secondary | ICD-10-CM | POA: Diagnosis not present

## 2014-04-17 DIAGNOSIS — C8590 Non-Hodgkin lymphoma, unspecified, unspecified site: Secondary | ICD-10-CM | POA: Diagnosis not present

## 2014-04-17 DIAGNOSIS — K5900 Constipation, unspecified: Secondary | ICD-10-CM | POA: Diagnosis not present

## 2014-04-17 DIAGNOSIS — R0989 Other specified symptoms and signs involving the circulatory and respiratory systems: Secondary | ICD-10-CM | POA: Diagnosis not present

## 2014-05-01 DIAGNOSIS — C8590 Non-Hodgkin lymphoma, unspecified, unspecified site: Secondary | ICD-10-CM | POA: Diagnosis not present

## 2014-05-01 DIAGNOSIS — K219 Gastro-esophageal reflux disease without esophagitis: Secondary | ICD-10-CM | POA: Diagnosis not present

## 2014-05-01 DIAGNOSIS — I10 Essential (primary) hypertension: Secondary | ICD-10-CM | POA: Diagnosis not present

## 2014-05-01 DIAGNOSIS — M150 Primary generalized (osteo)arthritis: Secondary | ICD-10-CM | POA: Diagnosis not present

## 2014-05-31 DIAGNOSIS — M150 Primary generalized (osteo)arthritis: Secondary | ICD-10-CM | POA: Diagnosis not present

## 2014-05-31 DIAGNOSIS — I10 Essential (primary) hypertension: Secondary | ICD-10-CM | POA: Diagnosis not present

## 2014-05-31 DIAGNOSIS — K219 Gastro-esophageal reflux disease without esophagitis: Secondary | ICD-10-CM | POA: Diagnosis not present

## 2014-05-31 DIAGNOSIS — C8590 Non-Hodgkin lymphoma, unspecified, unspecified site: Secondary | ICD-10-CM | POA: Diagnosis not present

## 2014-07-01 DIAGNOSIS — K219 Gastro-esophageal reflux disease without esophagitis: Secondary | ICD-10-CM | POA: Diagnosis not present

## 2014-07-01 DIAGNOSIS — C8590 Non-Hodgkin lymphoma, unspecified, unspecified site: Secondary | ICD-10-CM | POA: Diagnosis not present

## 2014-07-01 DIAGNOSIS — I10 Essential (primary) hypertension: Secondary | ICD-10-CM | POA: Diagnosis not present

## 2014-07-01 DIAGNOSIS — M150 Primary generalized (osteo)arthritis: Secondary | ICD-10-CM | POA: Diagnosis not present

## 2014-07-02 DIAGNOSIS — K625 Hemorrhage of anus and rectum: Secondary | ICD-10-CM | POA: Diagnosis not present

## 2014-07-04 DIAGNOSIS — D500 Iron deficiency anemia secondary to blood loss (chronic): Secondary | ICD-10-CM | POA: Diagnosis not present

## 2014-07-04 DIAGNOSIS — K625 Hemorrhage of anus and rectum: Secondary | ICD-10-CM | POA: Diagnosis not present

## 2014-07-22 IMAGING — CT CT ABD-PELV W/ CM
2 of 5 series · 15 of 36 positions shown, 18 images · IV contrast (CONTRAST)
Comparison: MRI 02/22/2013

CLINICAL DATA: Lymphoma staging. Patient recently underwent the
thoracic decompression for metastatic lymphoma invading the epidural
space from T5 through T8.

EXAM:
CT CHEST, ABDOMEN, AND PELVIS WITH CONTRAST
TECHNIQUE: Multidetector CT imaging of the chest, abdomen and pelvis was
performed following the standard protocol during bolus
administration of intravenous contrast.
CONTRAST:  80mL OMNIPAQUE IOHEXOL 300 MG/ML  SOLN

[Series 2: cap with · axial · 0.68mm/px · z∈[-689,-169]mm · 12 of 122 slices shown, 15 images]
[im 9/122  mediastinal]
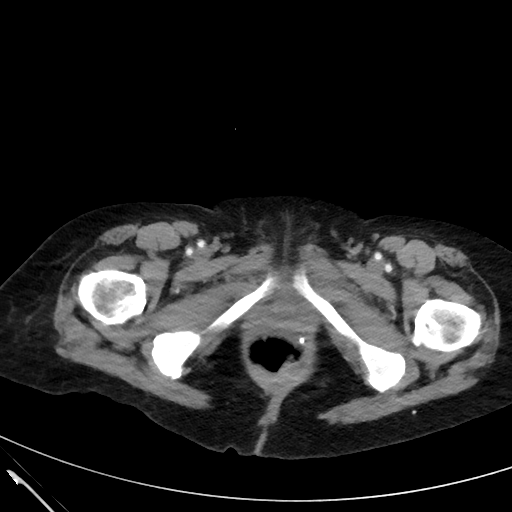
[im 9/122  lung]
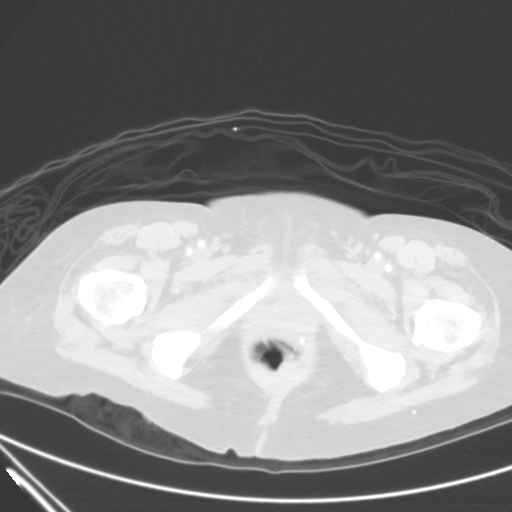
[im 17/122  lung]
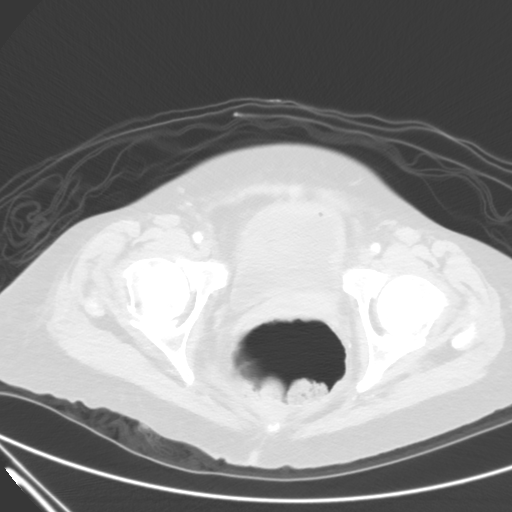
[im 25/122  lung]
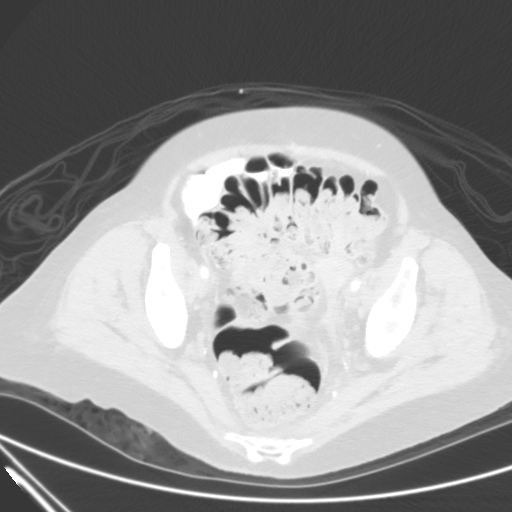
[im 41/122  lung]
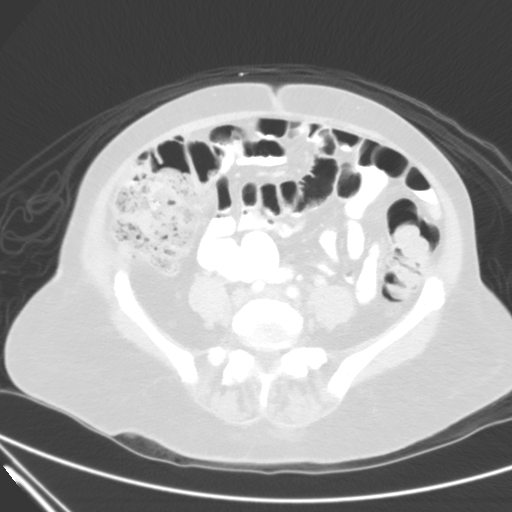
[im 49/122  mediastinal]
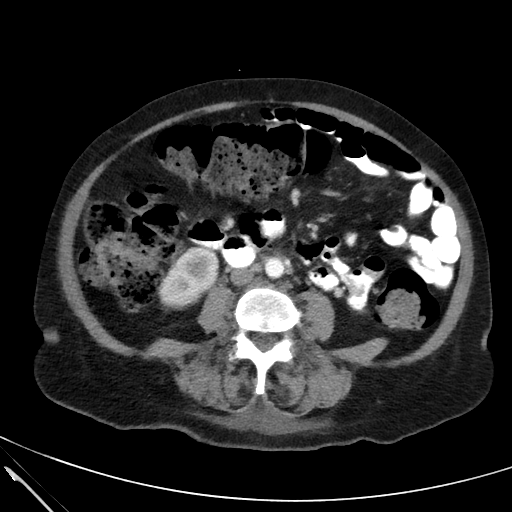
[im 49/122  lung]
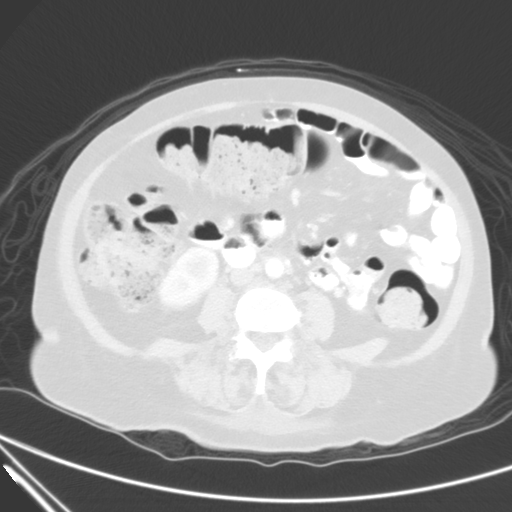
[im 57/122  lung]
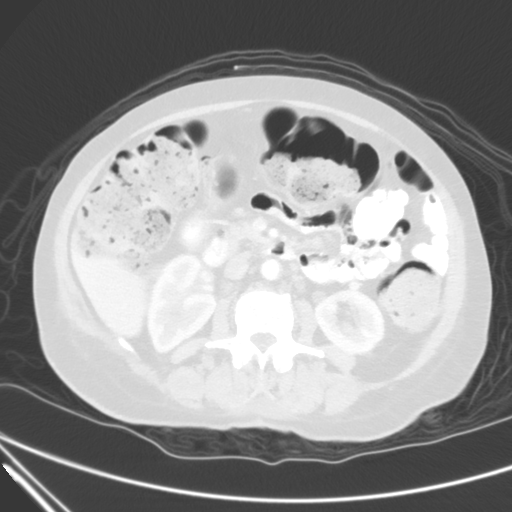
[im 65/122  lung]
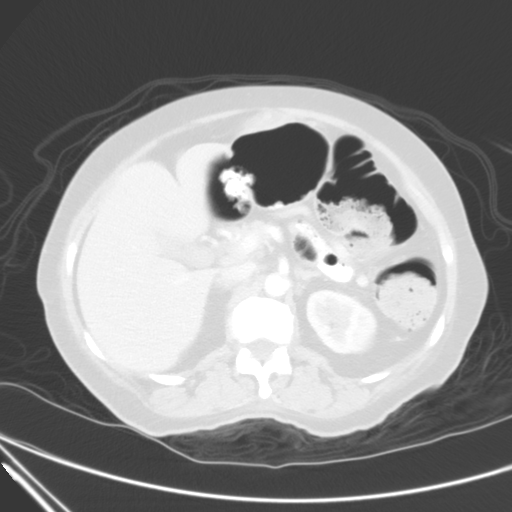
[im 73/122  lung]
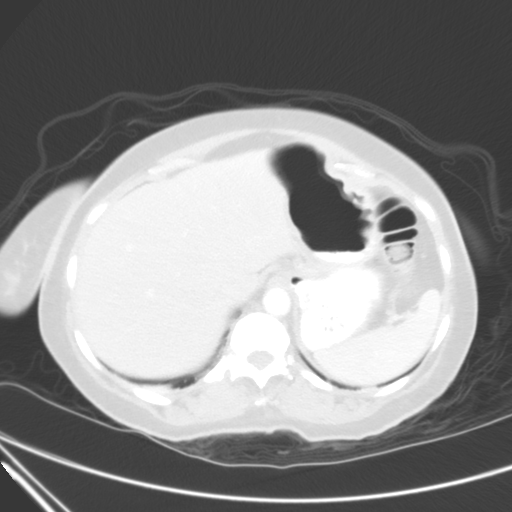
[im 81/122  mediastinal]
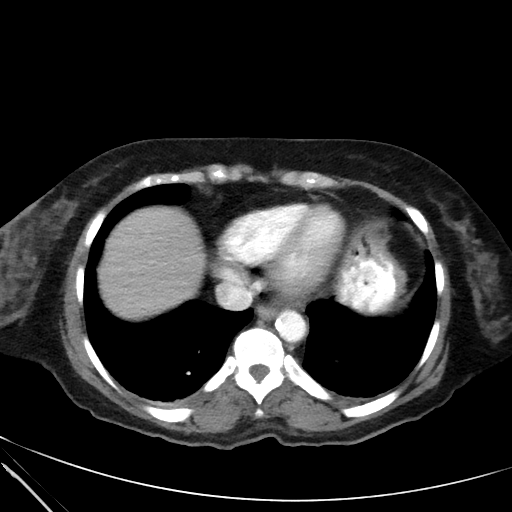
[im 81/122  lung]
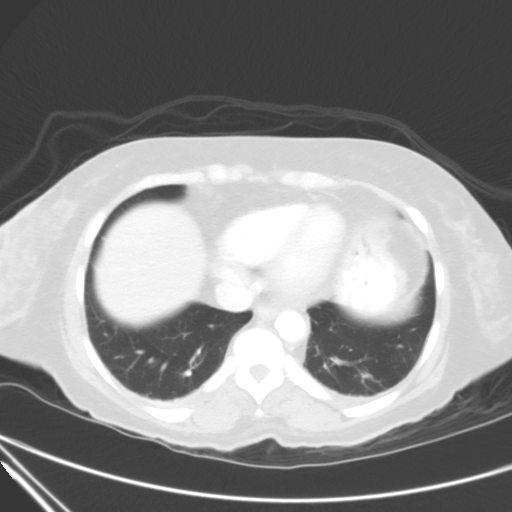
[im 97/122  lung]
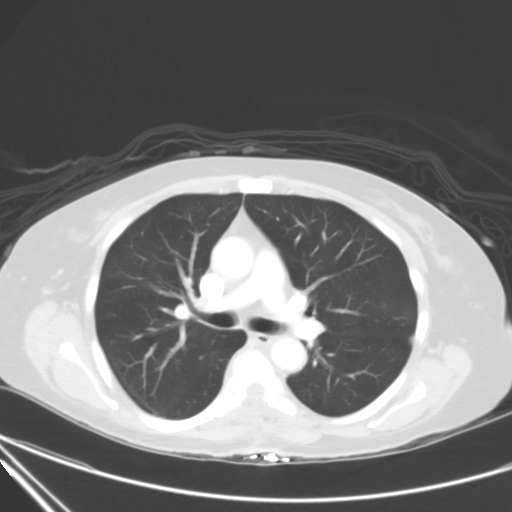
[im 105/122  lung]
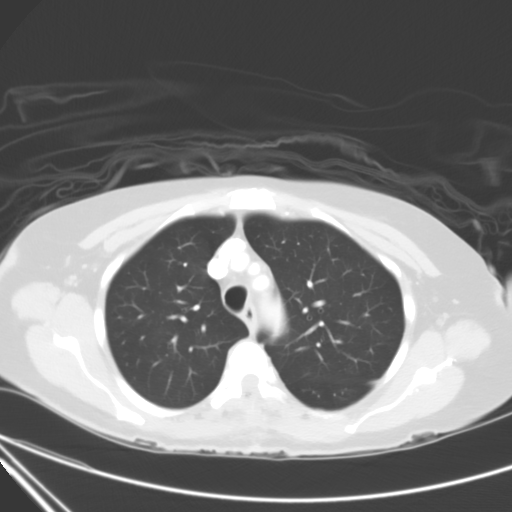
[im 113/122  lung]
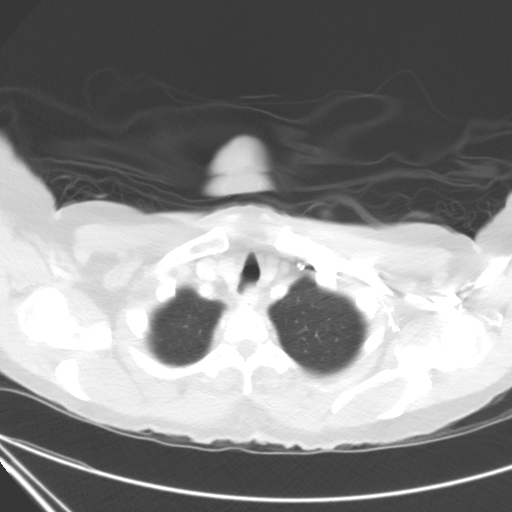

[mpr, coronals, coronal · coronal · 1.18mm/px · 3 of 97 slices shown]
[im 20/97  lung]
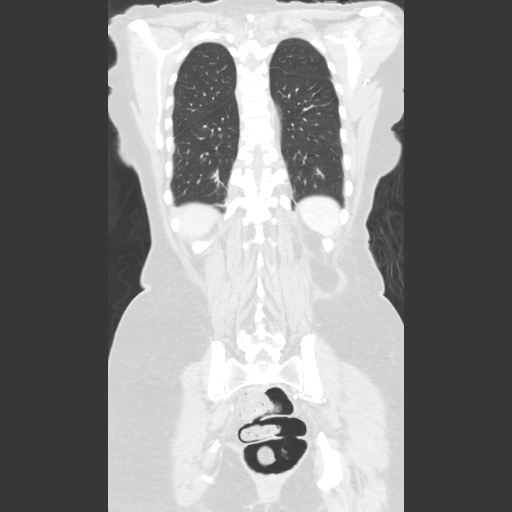
[im 39/97  lung]
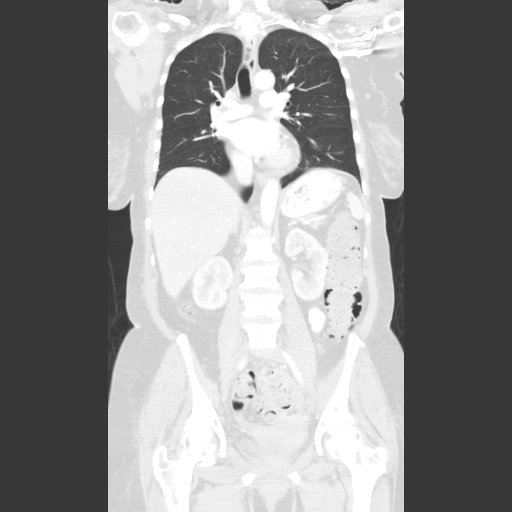
[im 58/97  lung]
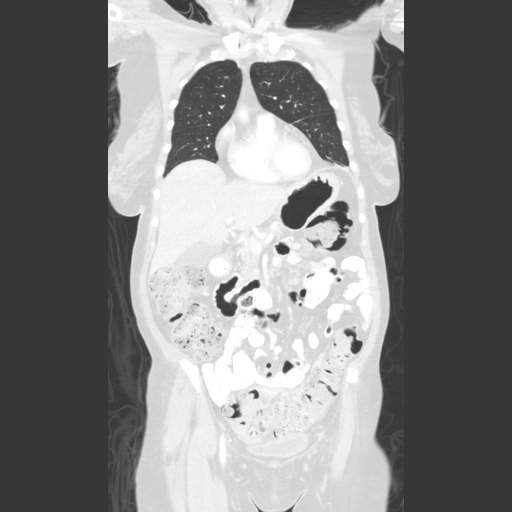

[15 of 36 positions shown; findings below may reference images not displayed]

FINDINGS: CT CHEST FINDINGS

No axillary or supraclavicular lymphadenopathy. No mediastinal hilar
lymphadenopathy. No pericardial fluid.

There is paraspinal thickening along the right aspect of the
vertebral from T6 through T10. This is most thick at T6 measuring 8
mm (image 23, series 2). There is laminectomies noted from T5
through T8 without complicating features.

Review of the lung parenchyma demonstrates no suspicious nodules.
Airways are normal.

CT ABDOMEN AND PELVIS FINDINGS

No focal hepatic lesion. The gallbladder, pancreas, spleen, adrenal
glands, and kidneys are normal.

The stomach, small bowel, cecum are normal. There is a large volume
stool throughout the title colon. No obstructing lesion identified.
Gas in distal rectum.

Number or is normal caliber. There is small sub cm periaortic lymph
nodes. No mesenteric adenopathy.

No free fluid the pelvis. The uterus and bladder normal. No pelvic
lymphadenopathy.

Review of the lung parenchyma demonstrates some sclerosis of the
vertebral bodies. The widespread metastasis present on comparison
MRI is much less evident on CT. .
IMPRESSION: 1. Mild paravertebral thickening on the right from T6 through T10
relates to lymphoma.
2. Posterior decompression from T5 through T8 for lymphoma
involvement of the spinal cord. No complicating features.
3. No evidence of lymphadenopathy with the chest, abdomen, or
pelvis,
4. Spleen is normal.
5. Large volume stool throughout the colon suggests constipation. No
obstructing lesion identified.
6. Widespread skeletal metastasis within the vertebral bodies on
comparison MRI of the spine are not well appreciated by CT.
7. Patient may benefit from an outpatient FDG PET-CT scan.

## 2014-07-31 DIAGNOSIS — I10 Essential (primary) hypertension: Secondary | ICD-10-CM | POA: Diagnosis not present

## 2014-07-31 DIAGNOSIS — M150 Primary generalized (osteo)arthritis: Secondary | ICD-10-CM | POA: Diagnosis not present

## 2014-07-31 DIAGNOSIS — K219 Gastro-esophageal reflux disease without esophagitis: Secondary | ICD-10-CM | POA: Diagnosis not present

## 2014-07-31 DIAGNOSIS — C8590 Non-Hodgkin lymphoma, unspecified, unspecified site: Secondary | ICD-10-CM | POA: Diagnosis not present

## 2014-08-31 DIAGNOSIS — I10 Essential (primary) hypertension: Secondary | ICD-10-CM | POA: Diagnosis not present

## 2014-08-31 DIAGNOSIS — C8590 Non-Hodgkin lymphoma, unspecified, unspecified site: Secondary | ICD-10-CM | POA: Diagnosis not present

## 2014-08-31 DIAGNOSIS — K219 Gastro-esophageal reflux disease without esophagitis: Secondary | ICD-10-CM | POA: Diagnosis not present

## 2014-08-31 DIAGNOSIS — M150 Primary generalized (osteo)arthritis: Secondary | ICD-10-CM | POA: Diagnosis not present

## 2014-10-01 DIAGNOSIS — M150 Primary generalized (osteo)arthritis: Secondary | ICD-10-CM | POA: Diagnosis not present

## 2014-10-01 DIAGNOSIS — K219 Gastro-esophageal reflux disease without esophagitis: Secondary | ICD-10-CM | POA: Diagnosis not present

## 2014-10-01 DIAGNOSIS — I10 Essential (primary) hypertension: Secondary | ICD-10-CM | POA: Diagnosis not present

## 2014-10-01 DIAGNOSIS — C8590 Non-Hodgkin lymphoma, unspecified, unspecified site: Secondary | ICD-10-CM | POA: Diagnosis not present

## 2014-10-31 DIAGNOSIS — K219 Gastro-esophageal reflux disease without esophagitis: Secondary | ICD-10-CM | POA: Diagnosis not present

## 2014-10-31 DIAGNOSIS — I10 Essential (primary) hypertension: Secondary | ICD-10-CM | POA: Diagnosis not present

## 2014-10-31 DIAGNOSIS — C8590 Non-Hodgkin lymphoma, unspecified, unspecified site: Secondary | ICD-10-CM | POA: Diagnosis not present

## 2014-10-31 DIAGNOSIS — M150 Primary generalized (osteo)arthritis: Secondary | ICD-10-CM | POA: Diagnosis not present

## 2014-12-01 DIAGNOSIS — C8590 Non-Hodgkin lymphoma, unspecified, unspecified site: Secondary | ICD-10-CM | POA: Diagnosis not present

## 2014-12-01 DIAGNOSIS — K219 Gastro-esophageal reflux disease without esophagitis: Secondary | ICD-10-CM | POA: Diagnosis not present

## 2014-12-01 DIAGNOSIS — I10 Essential (primary) hypertension: Secondary | ICD-10-CM | POA: Diagnosis not present

## 2014-12-01 DIAGNOSIS — M150 Primary generalized (osteo)arthritis: Secondary | ICD-10-CM | POA: Diagnosis not present

## 2014-12-31 DIAGNOSIS — C8590 Non-Hodgkin lymphoma, unspecified, unspecified site: Secondary | ICD-10-CM | POA: Diagnosis not present

## 2014-12-31 DIAGNOSIS — K219 Gastro-esophageal reflux disease without esophagitis: Secondary | ICD-10-CM | POA: Diagnosis not present

## 2014-12-31 DIAGNOSIS — M150 Primary generalized (osteo)arthritis: Secondary | ICD-10-CM | POA: Diagnosis not present

## 2014-12-31 DIAGNOSIS — I10 Essential (primary) hypertension: Secondary | ICD-10-CM | POA: Diagnosis not present

## 2015-01-29 DIAGNOSIS — M150 Primary generalized (osteo)arthritis: Secondary | ICD-10-CM | POA: Diagnosis not present

## 2015-01-29 DIAGNOSIS — K219 Gastro-esophageal reflux disease without esophagitis: Secondary | ICD-10-CM | POA: Diagnosis not present

## 2015-01-29 DIAGNOSIS — I10 Essential (primary) hypertension: Secondary | ICD-10-CM | POA: Diagnosis not present

## 2015-01-29 DIAGNOSIS — C8590 Non-Hodgkin lymphoma, unspecified, unspecified site: Secondary | ICD-10-CM | POA: Diagnosis not present

## 2015-01-31 DIAGNOSIS — I10 Essential (primary) hypertension: Secondary | ICD-10-CM | POA: Diagnosis not present

## 2015-01-31 DIAGNOSIS — K219 Gastro-esophageal reflux disease without esophagitis: Secondary | ICD-10-CM | POA: Diagnosis not present

## 2015-01-31 DIAGNOSIS — C8590 Non-Hodgkin lymphoma, unspecified, unspecified site: Secondary | ICD-10-CM | POA: Diagnosis not present

## 2015-01-31 DIAGNOSIS — M150 Primary generalized (osteo)arthritis: Secondary | ICD-10-CM | POA: Diagnosis not present

## 2015-03-03 DIAGNOSIS — C8590 Non-Hodgkin lymphoma, unspecified, unspecified site: Secondary | ICD-10-CM | POA: Diagnosis not present

## 2015-03-03 DIAGNOSIS — K219 Gastro-esophageal reflux disease without esophagitis: Secondary | ICD-10-CM | POA: Diagnosis not present

## 2015-03-03 DIAGNOSIS — I10 Essential (primary) hypertension: Secondary | ICD-10-CM | POA: Diagnosis not present

## 2015-03-03 DIAGNOSIS — M150 Primary generalized (osteo)arthritis: Secondary | ICD-10-CM | POA: Diagnosis not present

## 2015-03-31 DIAGNOSIS — K219 Gastro-esophageal reflux disease without esophagitis: Secondary | ICD-10-CM | POA: Diagnosis not present

## 2015-03-31 DIAGNOSIS — I10 Essential (primary) hypertension: Secondary | ICD-10-CM | POA: Diagnosis not present

## 2015-03-31 DIAGNOSIS — M150 Primary generalized (osteo)arthritis: Secondary | ICD-10-CM | POA: Diagnosis not present

## 2015-03-31 DIAGNOSIS — C8590 Non-Hodgkin lymphoma, unspecified, unspecified site: Secondary | ICD-10-CM | POA: Diagnosis not present

## 2015-05-01 DIAGNOSIS — C8590 Non-Hodgkin lymphoma, unspecified, unspecified site: Secondary | ICD-10-CM | POA: Diagnosis not present

## 2015-05-01 DIAGNOSIS — M150 Primary generalized (osteo)arthritis: Secondary | ICD-10-CM | POA: Diagnosis not present

## 2015-05-01 DIAGNOSIS — K219 Gastro-esophageal reflux disease without esophagitis: Secondary | ICD-10-CM | POA: Diagnosis not present

## 2015-05-01 DIAGNOSIS — I10 Essential (primary) hypertension: Secondary | ICD-10-CM | POA: Diagnosis not present

## 2015-08-29 DIAGNOSIS — K219 Gastro-esophageal reflux disease without esophagitis: Secondary | ICD-10-CM | POA: Diagnosis not present

## 2015-08-29 DIAGNOSIS — I10 Essential (primary) hypertension: Secondary | ICD-10-CM | POA: Diagnosis not present

## 2015-08-29 DIAGNOSIS — C8590 Non-Hodgkin lymphoma, unspecified, unspecified site: Secondary | ICD-10-CM | POA: Diagnosis not present

## 2015-08-29 DIAGNOSIS — R609 Edema, unspecified: Secondary | ICD-10-CM | POA: Diagnosis not present

## 2015-12-27 ENCOUNTER — Inpatient Hospital Stay (HOSPITAL_COMMUNITY)
Admission: EM | Admit: 2015-12-27 | Discharge: 2016-01-01 | DRG: 641 | Disposition: A | Discharge status: Skilled Nursing Facility | Payer: Commercial Managed Care - HMO | Attending: Internal Medicine | Admitting: Internal Medicine

## 2015-12-27 ENCOUNTER — Emergency Department (HOSPITAL_COMMUNITY): Payer: Commercial Managed Care - HMO

## 2015-12-27 ENCOUNTER — Encounter (HOSPITAL_COMMUNITY): Payer: Self-pay

## 2015-12-27 DIAGNOSIS — N390 Urinary tract infection, site not specified: Secondary | ICD-10-CM | POA: Diagnosis present

## 2015-12-27 DIAGNOSIS — R05 Cough: Secondary | ICD-10-CM | POA: Diagnosis not present

## 2015-12-27 DIAGNOSIS — R29898 Other symptoms and signs involving the musculoskeletal system: Secondary | ICD-10-CM | POA: Diagnosis present

## 2015-12-27 DIAGNOSIS — E875 Hyperkalemia: Secondary | ICD-10-CM | POA: Diagnosis present

## 2015-12-27 DIAGNOSIS — R531 Weakness: Secondary | ICD-10-CM

## 2015-12-27 DIAGNOSIS — E876 Hypokalemia: Secondary | ICD-10-CM | POA: Diagnosis not present

## 2015-12-27 DIAGNOSIS — D638 Anemia in other chronic diseases classified elsewhere: Secondary | ICD-10-CM | POA: Diagnosis present

## 2015-12-27 DIAGNOSIS — F1010 Alcohol abuse, uncomplicated: Secondary | ICD-10-CM | POA: Diagnosis not present

## 2015-12-27 DIAGNOSIS — K449 Diaphragmatic hernia without obstruction or gangrene: Secondary | ICD-10-CM | POA: Diagnosis present

## 2015-12-27 DIAGNOSIS — C8330 Diffuse large B-cell lymphoma, unspecified site: Secondary | ICD-10-CM | POA: Diagnosis not present

## 2015-12-27 DIAGNOSIS — N183 Chronic kidney disease, stage 3 (moderate): Secondary | ICD-10-CM | POA: Diagnosis present

## 2015-12-27 DIAGNOSIS — Z9842 Cataract extraction status, left eye: Secondary | ICD-10-CM | POA: Diagnosis not present

## 2015-12-27 DIAGNOSIS — R404 Transient alteration of awareness: Secondary | ICD-10-CM | POA: Diagnosis not present

## 2015-12-27 DIAGNOSIS — K219 Gastro-esophageal reflux disease without esophagitis: Secondary | ICD-10-CM | POA: Diagnosis present

## 2015-12-27 DIAGNOSIS — Z923 Personal history of irradiation: Secondary | ICD-10-CM | POA: Diagnosis not present

## 2015-12-27 DIAGNOSIS — Z90710 Acquired absence of both cervix and uterus: Secondary | ICD-10-CM

## 2015-12-27 DIAGNOSIS — N179 Acute kidney failure, unspecified: Secondary | ICD-10-CM | POA: Diagnosis not present

## 2015-12-27 DIAGNOSIS — R1310 Dysphagia, unspecified: Secondary | ICD-10-CM | POA: Diagnosis present

## 2015-12-27 DIAGNOSIS — Z66 Do not resuscitate: Secondary | ICD-10-CM | POA: Diagnosis present

## 2015-12-27 DIAGNOSIS — C8510 Unspecified B-cell lymphoma, unspecified site: Secondary | ICD-10-CM | POA: Insufficient documentation

## 2015-12-27 DIAGNOSIS — N189 Chronic kidney disease, unspecified: Secondary | ICD-10-CM

## 2015-12-27 DIAGNOSIS — Z79899 Other long term (current) drug therapy: Secondary | ICD-10-CM

## 2015-12-27 DIAGNOSIS — I10 Essential (primary) hypertension: Secondary | ICD-10-CM | POA: Diagnosis not present

## 2015-12-27 DIAGNOSIS — C7989 Secondary malignant neoplasm of other specified sites: Secondary | ICD-10-CM | POA: Diagnosis present

## 2015-12-27 DIAGNOSIS — Z993 Dependence on wheelchair: Secondary | ICD-10-CM | POA: Diagnosis not present

## 2015-12-27 DIAGNOSIS — E860 Dehydration: Secondary | ICD-10-CM | POA: Diagnosis present

## 2015-12-27 DIAGNOSIS — I129 Hypertensive chronic kidney disease with stage 1 through stage 4 chronic kidney disease, or unspecified chronic kidney disease: Secondary | ICD-10-CM | POA: Diagnosis present

## 2015-12-27 DIAGNOSIS — G8220 Paraplegia, unspecified: Secondary | ICD-10-CM | POA: Diagnosis present

## 2015-12-27 DIAGNOSIS — Z9841 Cataract extraction status, right eye: Secondary | ICD-10-CM | POA: Diagnosis not present

## 2015-12-27 DIAGNOSIS — N170 Acute kidney failure with tubular necrosis: Secondary | ICD-10-CM | POA: Diagnosis not present

## 2015-12-27 LAB — URINALYSIS, ROUTINE W REFLEX MICROSCOPIC
BILIRUBIN URINE: NEGATIVE
Glucose, UA: NEGATIVE mg/dL
Hgb urine dipstick: NEGATIVE
KETONES UR: 5 mg/dL — AB
Nitrite: NEGATIVE
Protein, ur: 30 mg/dL — AB
Specific Gravity, Urine: 1.011 (ref 1.005–1.030)
pH: 6 (ref 5.0–8.0)

## 2015-12-27 LAB — COMPREHENSIVE METABOLIC PANEL
ALBUMIN: 3.2 g/dL — AB (ref 3.5–5.0)
ALT: 20 U/L (ref 14–54)
AST: 23 U/L (ref 15–41)
Alkaline Phosphatase: 91 U/L (ref 38–126)
Anion gap: 13 (ref 5–15)
BILIRUBIN TOTAL: 0.9 mg/dL (ref 0.3–1.2)
BUN: 48 mg/dL — AB (ref 6–20)
CO2: 28 mmol/L (ref 22–32)
CREATININE: 3.14 mg/dL — AB (ref 0.44–1.00)
Chloride: 94 mmol/L — ABNORMAL LOW (ref 101–111)
GFR calc Af Amer: 16 mL/min — ABNORMAL LOW (ref >60)
GFR calc non Af Amer: 14 mL/min — ABNORMAL LOW (ref >60)
GLUCOSE: 83 mg/dL (ref 65–99)
Potassium: 3.4 mmol/L — ABNORMAL LOW (ref 3.5–5.1)
Sodium: 135 mmol/L (ref 135–145)
TOTAL PROTEIN: 6.3 g/dL — AB (ref 6.5–8.1)

## 2015-12-27 LAB — CBC WITH DIFFERENTIAL/PLATELET
BASOS PCT: 0 %
Basophils Absolute: 0 10*3/uL (ref 0.0–0.1)
Eosinophils Absolute: 0 10*3/uL (ref 0.0–0.7)
Eosinophils Relative: 0 %
HEMATOCRIT: 32.9 % — AB (ref 36.0–46.0)
HEMOGLOBIN: 11.2 g/dL — AB (ref 12.0–15.0)
LYMPHS PCT: 14 %
Lymphs Abs: 1.2 10*3/uL (ref 0.7–4.0)
MCH: 27.2 pg (ref 26.0–34.0)
MCHC: 34 g/dL (ref 30.0–36.0)
MCV: 79.9 fL (ref 78.0–100.0)
MONOS PCT: 12 %
Monocytes Absolute: 1.1 10*3/uL — ABNORMAL HIGH (ref 0.1–1.0)
NEUTROS PCT: 74 %
Neutro Abs: 6.6 10*3/uL (ref 1.7–7.7)
Platelets: 489 10*3/uL — ABNORMAL HIGH (ref 150–400)
RBC: 4.12 MIL/uL (ref 3.87–5.11)
RDW: 14 % (ref 11.5–15.5)
WBC: 8.9 10*3/uL (ref 4.0–10.5)

## 2015-12-27 LAB — I-STAT TROPONIN, ED: Troponin i, poc: 0.02 ng/mL (ref 0.00–0.08)

## 2015-12-27 LAB — I-STAT CG4 LACTIC ACID, ED: Lactic Acid, Venous: 0.8 mmol/L (ref 0.5–1.9)

## 2015-12-27 MED ORDER — DEXTROSE 5 % IV SOLN
1.0000 g | INTRAVENOUS | Status: DC
  Administered 2015-12-28 – 2015-12-30 (×3): 1 g via INTRAVENOUS
  Filled 2015-12-27 (×4): qty 10

## 2015-12-27 MED ORDER — HEPARIN SODIUM (PORCINE) 5000 UNIT/ML IJ SOLN
5000.0000 [IU] | Freq: Three times a day (TID) | INTRAMUSCULAR | Status: DC
  Administered 2015-12-27 – 2015-12-31 (×13): 5000 [IU] via SUBCUTANEOUS
  Filled 2015-12-27 (×14): qty 1

## 2015-12-27 MED ORDER — LORAZEPAM 1 MG PO TABS: 1.0000 mg | ORAL_TABLET | Freq: Four times a day (QID) | ORAL | Status: DC | PRN

## 2015-12-27 MED ORDER — LORAZEPAM 2 MG/ML IJ SOLN: 0.0000 mg | Freq: Four times a day (QID) | INTRAMUSCULAR | Status: AC

## 2015-12-27 MED ORDER — ACETAMINOPHEN 325 MG PO TABS: 650.0000 mg | ORAL_TABLET | Freq: Four times a day (QID) | ORAL | Status: DC | PRN

## 2015-12-27 MED ORDER — ONDANSETRON HCL 4 MG PO TABS: 4.0000 mg | ORAL_TABLET | Freq: Four times a day (QID) | ORAL | Status: DC | PRN

## 2015-12-27 MED ORDER — SODIUM CHLORIDE 0.9 % IV BOLUS (SEPSIS)
1000.0000 mL | Freq: Once | INTRAVENOUS | Status: AC
  Administered 2015-12-27: 1000 mL via INTRAVENOUS

## 2015-12-27 MED ORDER — ADULT MULTIVITAMIN W/MINERALS CH
1.0000 | ORAL_TABLET | Freq: Every day | ORAL | Status: DC
  Administered 2015-12-28 – 2016-01-01 (×5): 1 via ORAL
  Filled 2015-12-27 (×6): qty 1

## 2015-12-27 MED ORDER — VITAMIN B-1 100 MG PO TABS
100.0000 mg | ORAL_TABLET | Freq: Every day | ORAL | Status: DC
  Administered 2015-12-28 – 2016-01-01 (×5): 100 mg via ORAL
  Filled 2015-12-27 (×7): qty 1

## 2015-12-27 MED ORDER — AMLODIPINE BESYLATE 5 MG PO TABS
10.0000 mg | ORAL_TABLET | Freq: Every day | ORAL | Status: DC
  Administered 2015-12-27 – 2016-01-01 (×6): 10 mg via ORAL
  Filled 2015-12-27 (×6): qty 2

## 2015-12-27 MED ORDER — LORAZEPAM 2 MG/ML IJ SOLN: 0.0000 mg | Freq: Two times a day (BID) | INTRAMUSCULAR | Status: DC

## 2015-12-27 MED ORDER — HYDRALAZINE HCL 20 MG/ML IJ SOLN: 5.0000 mg | INTRAMUSCULAR | Status: DC | PRN

## 2015-12-27 MED ORDER — DEXTROSE 5 % IV SOLN
1.0000 g | Freq: Once | INTRAVENOUS | Status: AC
  Administered 2015-12-27: 1 g via INTRAVENOUS
  Filled 2015-12-27: qty 10

## 2015-12-27 MED ORDER — TETANUS-DIPHTH-ACELL PERTUSSIS 5-2.5-18.5 LF-MCG/0.5 IM SUSP: 0.5000 mL | Freq: Once | INTRAMUSCULAR | Status: DC

## 2015-12-27 MED ORDER — LORAZEPAM 2 MG/ML IJ SOLN: 1.0000 mg | Freq: Four times a day (QID) | INTRAMUSCULAR | Status: DC | PRN

## 2015-12-27 MED ORDER — FOLIC ACID 1 MG PO TABS
1.0000 mg | ORAL_TABLET | Freq: Every day | ORAL | Status: DC
  Administered 2015-12-27 – 2016-01-01 (×6): 1 mg via ORAL
  Filled 2015-12-27 (×6): qty 1

## 2015-12-27 MED ORDER — SODIUM CHLORIDE 0.9% FLUSH
3.0000 mL | Freq: Two times a day (BID) | INTRAVENOUS | Status: DC
  Administered 2015-12-28 – 2015-12-31 (×6): 3 mL via INTRAVENOUS

## 2015-12-27 MED ORDER — ACETAMINOPHEN 650 MG RE SUPP: 650.0000 mg | Freq: Four times a day (QID) | RECTAL | Status: DC | PRN

## 2015-12-27 MED ORDER — ZOLPIDEM TARTRATE 5 MG PO TABS
5.0000 mg | ORAL_TABLET | Freq: Every evening | ORAL | Status: DC | PRN
  Administered 2015-12-29: 5 mg via ORAL
  Filled 2015-12-27: qty 1

## 2015-12-27 MED ORDER — THIAMINE HCL 100 MG/ML IJ SOLN
100.0000 mg | Freq: Every day | INTRAMUSCULAR | Status: DC
  Filled 2015-12-27 (×2): qty 2

## 2015-12-27 MED ORDER — SODIUM CHLORIDE 0.9 % IV SOLN
INTRAVENOUS | Status: DC
  Administered 2015-12-27 – 2015-12-31 (×7): via INTRAVENOUS

## 2015-12-27 MED ORDER — ONDANSETRON HCL 4 MG/2ML IJ SOLN
4.0000 mg | Freq: Four times a day (QID) | INTRAMUSCULAR | Status: DC | PRN
  Administered 2015-12-29: 4 mg via INTRAVENOUS
  Filled 2015-12-27: qty 2

## 2015-12-27 MED ORDER — POTASSIUM CHLORIDE 20 MEQ/15ML (10%) PO SOLN
20.0000 meq | Freq: Once | ORAL | Status: AC
  Administered 2015-12-27: 20 meq via ORAL
  Filled 2015-12-27: qty 15

## 2015-12-27 NOTE — H&P (Addendum)
History and Physical    Ashley West [id number] DOB: 1940-12-28 DOA: 12/27/2015  Referring MD/NP/PA:   PCP: [name], MD   Patient coming from:  The patient is coming from home.  At baseline, pt is partially dependent for most of ADL.   Chief Complaint: Increased urinary frequency, generalized weakness, decreased oral intake  HPI: Ashley West is a 75 y.o. 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 Patient has been feeling weak, with decreased oral intake,  feeling nauseated. She seems to have difficulty swallowing. No vomiting or diarrhea. No fever or chills. She has increased urinary frequency, no dysuria or burning on urination. She continues to drink alcohol per her husband. Patient does not have chest pain, shortness of breath, cough, leg edema or rashes. She has chronic bilateral leg weakness which has not changed recently.   Of note, patient was diagnosed with lymphoma. She refused systemic chemotherapy. She had radiation therapy due to thoracic metastatic cancer with cord compression. She was placed on hospice 3 years ago. She was placed in a nursing home because of decline in her health, however she improved and was discharged home. She was released from hospice and oncology, and has been followed by primary care doctor since then. Patient last seen her doctor 5 months ago. Now she seems to be declining.   ED Course: pt was found to have positive urinalysis with large amount of leukocyte,  WBC 8.9, calcium 15.0, lactate 0.8, negative troponin, potassium 3.4, worsening renal function with creatinine up from 1.0-->3.14, BUN 48, chest x-ray showed bilateral pleural effusion and possible atelectasis. Temperature normal, no tachycardia, O2 saturation 95% on room air. Patient is admitted to telemetry bed as inpt,  Review of Systems:   General: no fevers, chills, has poor appetite, has fatigue HEENT: no blurry vision, hearing changes or sore throat Respiratory: no dyspnea, coughing, wheezing CV: no chest pain, no palpitations GI: has nausea, no vomiting, abdominal pain, diarrhea, constipation. Has difficulty swallowing. GU: no dysuria, burning on urination, has increased urinary frequency, no hematuria  Ext: no leg edema Neuro: No vision change or hearing loss. Has chronic bilateral leg weakness Skin: no rash, no skin tear. MSK: No muscle spasm, no deformity, no limitation of range of movement in spin Heme: No easy bruising.  Travel history: No recent long distant travel.  Allergy: No Known Allergies  Past Medical History:  Diagnosis Date  . Diffuse large B cell lymphoma ([place])   . GERD (gastroesophageal reflux disease)   . Hiatal hernia   . Hypertension   . Weakness of both lower extremities 03/08/2013    Past Surgical History:  Procedure Laterality Date  . ABDOMINAL HYSTERECTOMY    . CATARACT EXTRACTION     Bilateral  . CYST EXCISION     Left Elbow, Arm and back  . ESOPHAGOGASTRODUODENOSCOPY N/A 04/20/2012   Procedure: ESOPHAGOGASTRODUODENOSCOPY (EGD);  Surgeon: Cleotis Nipper, MD;  Location: [facility] ENDOSCOPY;  Service: Endoscopy;  Laterality: N/A;  . LAMINECTOMY N/A 02/22/2013   Procedure: THORACIC LAMINECTOMY FOR TUMOR;  Surgeon: Erline Levine, MD;  Location: [facility] NEURO ORS;  Service: Neurosurgery;  Laterality: N/A;    Social History:  reports that she has never smoked. She has never used smokeless tobacco. She reports that she drinks alcohol. She reports that she does not  use drugs.  Family History:  Family History  Problem Relation Age of Onset  . Breast cancer Sister   . Cancer Sister     uterine v/s ovarina?     Prior to Admission medications   Medication Sig Start Date End Date Taking? Authorizing Provider  losartan-hydrochlorothiazide (HYZAAR) 50-12.5 MG per tablet Take 1 tablet by mouth every morning.    Yes Historical Provider, MD  Multiple Vitamin (MULTIVITAMIN WITH MINERALS) TABS Take 1 tablet by mouth daily.   Yes Historical Provider, MD  potassium chloride (K-DUR) 10 MEQ tablet Take 10 mEq by mouth daily.   Yes Historical Provider, MD  vitamin E (VITAMIN E) 400 UNIT capsule Take 400 Units by mouth daily.   Yes Historical Provider, MD    Physical Exam: Vitals:   12/27/15 2000 12/27/15 2100 12/27/15 2105 12/27/15 2159  BP: 159/59 155/59 155/59 (!) 184/66  Pulse: 72 73 73   Resp: 15 18 15 18   Temp:    98.5 F (36.9 C)  TempSrc:    Oral  SpO2: 95% 97% 96% 98%  Weight:    56.5 kg (124 lb 9 oz)  Height:    5' (1.524 m)   General: Not in acute distress. Dry mucus and membrane HEENT:       Eyes: PERRL, EOMI, no scleral icterus.       ENT: No discharge from the ears and nose, no pharynx injection, no tonsillar enlargement.        Neck: No JVD, no bruit, no mass felt. Heme: No neck lymph node enlargement. Cardiac: S1/S2, RRR, No murmurs, No gallops or rubs. Respiratory: No rales, wheezing, rhonchi or rubs. GI: Soft, nondistended, nontender, no rebound pain, no organomegaly, BS present. GU: No hematuria Ext: No pitting leg edema bilaterally. 2+DP/PT pulse bilaterally. Musculoskeletal: No joint deformities, No joint redness or warmth. Skin: No rashes.  Neuro: Alert, oriented X3, cranial nerves II-XII grossly intact, has bilateral leg weakness Psych: Patient is not psychotic, no suicidal or hemocidal ideation.  Labs on Admission: I have personally reviewed following labs and imaging studies  CBC:  Recent Labs Lab 12/27/15 1653  WBC  8.9  NEUTROABS 6.6  HGB 11.2*  HCT 32.9*  MCV 79.9  PLT 0000000*   Basic Metabolic Panel:  Recent Labs Lab 12/27/15 1653  NA 135  K 3.4*  CL 94*  CO2 28  GLUCOSE 83  BUN 48*  CREATININE 3.14*  CALCIUM >15.0*   GFR: Estimated Creatinine Clearance: 12.4 mL/min (by C-G formula based on SCr of 3.14 mg/dL (H)). Liver Function Tests:  Recent Labs Lab 12/27/15 1653  AST 23  ALT 20  ALKPHOS 91  BILITOT 0.9  PROT 6.3*  ALBUMIN 3.2*   No results for input(s): LIPASE, AMYLASE in the last 168 hours. No results for input(s): AMMONIA in the last 168 hours. Coagulation Profile: No results for input(s): INR, PROTIME in the last 168 hours. Cardiac Enzymes: No results for input(s): CKTOTAL, CKMB, CKMBINDEX, TROPONINI in the last 168 hours. BNP (last 3 results) No results for input(s): PROBNP in the last 8760 hours. HbA1C: No results for input(s): HGBA1C in the last 72 hours. CBG: No results for input(s): GLUCAP in the last 168 hours. Lipid Profile: No results for input(s): CHOL, HDL, LDLCALC, TRIG, CHOLHDL, LDLDIRECT in  the last 72 hours. Thyroid Function Tests: No results for input(s): TSH, T4TOTAL, FREET4, T3FREE, THYROIDAB in the last 72 hours. Anemia Panel: No results for input(s): VITAMINB12, FOLATE, FERRITIN, TIBC, IRON, RETICCTPCT in the last 72 hours. Urine analysis:    Component Value Date/Time   COLORURINE YELLOW 12/27/2015 1800   APPEARANCEUR HAZY (A) 12/27/2015 1800   LABSPEC 1.011 12/27/2015 1800   PHURINE 6.0 12/27/2015 1800   GLUCOSEU NEGATIVE 12/27/2015 1800   HGBUR NEGATIVE 12/27/2015 1800   BILIRUBINUR NEGATIVE 12/27/2015 1800   KETONESUR 5 (A) 12/27/2015 1800   PROTEINUR 30 (A) 12/27/2015 1800   UROBILINOGEN 0.2 04/15/2013 1338   NITRITE NEGATIVE 12/27/2015 1800   LEUKOCYTESUR LARGE (A) 12/27/2015 1800   Sepsis Labs: @LABRCNTIP (procalcitonin:4,lacticidven:4) )No results found for this or any previous visit (from the past 240 hour(s)).    Radiological Exams on Admission: Dg Chest 2 View  Result Date: 12/27/2015 CLINICAL DATA:  Nausea, cough, possible aspiration EXAM: CHEST  2 VIEW COMPARISON:  03/02/2013 FINDINGS: Cardiomediastinal silhouette is stable. There is moderate partially loculated right pleural effusion. Atelectasis or infiltrate noted in right lower lobe. No pulmonary edema. Left lung is clear. Mild degenerative changes thoracic spine. IMPRESSION: Moderate partially loculated right pleural effusion with atelectasis or infiltrate in right lower lobe. No pulmonary edema. Electronically Signed   By: Lahoma Crocker M.D.   On: 12/27/2015 17:06     EKG: Independently reviewed.  Sinus rhythm, QTC 424, right bundle blockage, shortened ST segment   Assessment/Plan Principal Problem:   UTI (urinary tract infection) Active Problems:   HTN (hypertension)   Alcohol abuse   Diffuse large B cell lymphoma (HCC)   Weakness of both lower extremities   Acute on chronic kidney failure-II   Hypercalcemia   Generalized weakness   Hypokalemia   UTI (urinary tract infection): pt has increased urinary frequency and positive urinalysis, consistent with UTI. Patient is not septic. Hemodynamically stable.  - Admit to telemetry as inpt - Ceftriaxone by IV - Follow up results of urine and blood cx and amend antibiotic regimen if needed per sensitivity results - prn Zofran for nausea - IVF  Hypercalcemia related to malignancy: Ca=15, corrected to 15.6. Mental status normal. Most likely due to malignancy. Patient is on HCTZ, which may have also contributed partially. - check intact PTH,  25-hydroxy vitamin D - Patient may need a bone scan, please discuss with oncology in a.m. - Start the patient on aggressive IV hydration: 2L NS, then 125 cc/h - If calcium does not improve with aggressive hydration,  may consider starting the patient on bisphosphonate therapy.  - hold HCTZ  Hypokalemia: K=3.4 on admission. - Repleted  Difficulty  swallowing: Etiology is not clear. -Nothing by mouth -SLP  HTN: -Hold Hyzarr due to worsening renal function and hypercalcemia -Start amlodipine -IV hydration.  Diffuse large B cell lymphoma: Refused systemic chemotherapy. S/p of radiation therapy due to thoracic metastatic cancer with cord compression. has sequela of bilateral leg weakness, using wheelchair. -f/u with PCP  AoCKD-III: Baseline creatinine 1.0, her creatinine is 3.14, BUN 48.. Likely due to multifactorial etiology, including prerenal secondary to dehydration and continuation of ARB and diruetics, hypercalcinemia. - IVF as above - Check FeUrea - Follow up renal function by BMP - Hold Hyzaar   Generalized weakness: Multifactorial etiology, including UTI, hypercalcemia, dehydration, worsening renal function -Trace underlying issues as above -pt/ot  Alcohol abuse: -Did counseling about the importance of quitting drinking -CIWA protocol    DVT ppx: SQ Heparin  Code Status: DNR Family Communication: Yes, patient's husbandat bed side Disposition Plan:  Anticipate discharge back to previous home environment Consults called:  none Admission status: Inpatient/tele   Date of Service 12/27/2015    Ivor Costa [facility] Pager [phone]  If 7PM-7AM, please contact night-coverage [email or web address] Password [facility] 12/27/2015, 11:47 PM

## 2015-12-27 NOTE — ED Notes (Signed)
Bed: WA09 Expected date:  Expected time:  Means of arrival:  Comments: EMS 

## 2015-12-27 NOTE — ED Triage Notes (Signed)
Pt from home, lives with husband and c/o weakness and poor oral intake x a week.  C/o nausea when she tries to eat so she doesn't.   Uses a wheelchair normally but it's been more difficult for her to get around even with it.  EMS VS: 180/79 (hasn't had BP meds in few days)  72, 16, 99%RA, CBG 98.   Pt is axox4.  Family is at bedside.

## 2015-12-27 NOTE — ED Provider Notes (Signed)
[facility] DEPT Provider Note   CSN: [id number] Arrival date & time: 12/27/15  1524     History   Chief Complaint Chief Complaint  Patient presents with  . Weakness    HPI Ashley West is a 75 y.o. female.  HPI Ashley West is a 75 y.o. 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 Patient states that she was diagnosed with lymphoma, and was placed on hospice 3 years ago. She states that she went through radiation, however refused chemotherapy. She was placed in a nursing home because of decline in her health, however states that she improved and was discharged home. She was released from hospice and oncology, and has been followed by primary care doctor since then. Patient last seen her doctor 5 months ago. Patient understands she has end-stage cancer, but states she has been doing very well. Her health started to decline 2 weeks ago. Family state that in the last 2 weeks, patient has had no appetite, she has no energy. Patient states "all alone and didn't sleep." She is normally ambulatory in the wheelchair, however she has been laying down for the last week not wanting to do anything or eat anything. Family stated the patient gags and coughs anytime she eats anything. She is unable to eat solids at all, but she is still able to drink sips of water. They state that this gagging and coughing after eating started approximately a month ago. Generalized weakness and failure to thrive started about 2 weeks ago worsened in the last week. Family denied any recent illnesses. Patient denies any pain anywhere. She denies any urinary symptoms. No other complaints.  Past Medical History:  Diagnosis Date  . Diffuse large B cell lymphoma ([place])     . GERD (gastroesophageal reflux disease)   . Hiatal hernia   . Hypertension   . Weakness of both lower extremities 03/08/2013    Patient Active Problem List   Diagnosis Date Noted  . Paraplegia ([place]) 04/15/2013  . Weakness of lower extremity 04/15/2013  . Weakness of both lower extremities 03/08/2013  . Diffuse large B cell lymphoma ([place]) 03/06/2013  . Acute respiratory failure ([place]) 02/24/2013  . Anemia 02/24/2013  . Diverticulosis of colon with hemorrhage 04/20/2012  . HTN (hypertension) 04/19/2012  . Alcohol abuse 04/19/2012    Past Surgical History:  Procedure Laterality Date  . ABDOMINAL HYSTERECTOMY    . CATARACT EXTRACTION     Bilateral  . CYST EXCISION     Left Elbow, Arm and back  . ESOPHAGOGASTRODUODENOSCOPY N/A 04/20/2012   Procedure: ESOPHAGOGASTRODUODENOSCOPY (EGD);  Surgeon: Cleotis Nipper, MD;  Location: [facility] ENDOSCOPY;  Service: Endoscopy;  Laterality: N/A;  . LAMINECTOMY N/A 02/22/2013   Procedure: THORACIC LAMINECTOMY FOR TUMOR;  Surgeon: Erline Levine, MD;  Location: [facility] NEURO ORS;  Service: Neurosurgery;  Laterality: N/A;    OB History    No data available       Home Medications    Prior to Admission medications   Medication Sig Start Date End Date Taking? Authorizing Provider  b complex vitamins tablet Take 1 tablet by mouth daily.    Historical Provider, MD  diclofenac (VOLTAREN) 75 MG EC tablet Take 75 mg by mouth 2 (two) times daily as needed (pain).  Historical Provider, MD  HYDROcodone-acetaminophen (NORCO/VICODIN) 5-325 MG per tablet Take one tablet by mouth every 4 hours as needed for pain 05/24/13   Lauree Chandler, NP  losartan-hydrochlorothiazide (HYZAAR) 50-12.5 MG per tablet Take 1 tablet by mouth every morning.     Historical Provider, MD  Multiple Vitamin (MULTIVITAMIN WITH MINERALS) TABS Take 1 tablet by mouth daily.    Historical Provider, MD  omeprazole (PRILOSEC) 20 MG capsule Take 20 mg by mouth daily.    Historical Provider, MD   polyethylene glycol (MIRALAX / GLYCOLAX) packet Take 17 g by mouth daily.    Historical Provider, MD  potassium chloride (K-DUR) 10 MEQ tablet Take 10 mEq by mouth daily.    Historical Provider, MD  promethazine (PHENERGAN) 25 MG tablet Take 25 mg by mouth every 6 (six) hours as needed for nausea or vomiting.    Historical Provider, MD  sertraline (ZOLOFT) 50 MG tablet Take 50 mg by mouth daily.    Historical Provider, MD  sodium chloride (OCEAN) 0.65 % SOLN nasal spray Place 2 sprays into both nostrils as needed for congestion.     Historical Provider, MD  vitamin C (ASCORBIC ACID) 500 MG tablet Take 500 mg by mouth daily.    Historical Provider, MD  vitamin E (VITAMIN E) 400 UNIT capsule Take 400 Units by mouth daily.    Historical Provider, MD  Wound Dressings (HYDROGEL) GEL Apply topically. Apply to buttock coccxy topically every day shift for wound    Historical Provider, MD    Family History Family History  Problem Relation Age of Onset  . Breast cancer Sister   . Cancer Sister     uterine v/s ovarina?    Social History Social History  Substance Use Topics  . Smoking status: Never Smoker  . Smokeless tobacco: Never Used  . Alcohol use Yes     Comment: 1 shot per night     Allergies   Patient has no known allergies.   Review of Systems Review of Systems  Constitutional: Positive for activity change, appetite change and fatigue. Negative for chills and fever.  Respiratory: Negative for cough, chest tightness and shortness of breath.   Cardiovascular: Negative for chest pain, palpitations and leg swelling.  Gastrointestinal: Negative for abdominal pain, diarrhea, nausea and vomiting.  Genitourinary: Negative for dysuria, flank pain and pelvic pain.  Musculoskeletal: Negative for arthralgias, myalgias, neck pain and neck stiffness.  Skin: Negative for rash.  Neurological: Positive for weakness. Negative for dizziness and headaches.  All other systems reviewed and are  negative.    Physical Exam Updated Vital Signs BP 155/65 (BP Location: Right Arm)   Pulse 76   Temp 97.8 F (36.6 C) (Oral)   Resp 16   Ht 5' (1.524 m)   Wt 58.1 kg Comment: last weight about 2-3 months ago per family  SpO2 95%   BMI 25.00 kg/m   Physical Exam  Constitutional: She is oriented to person, place, and time. She appears well-developed and well-nourished. No distress.  HENT:  Head: Normocephalic.  Oral mucosa is dry  Eyes: Conjunctivae are normal.  Neck: Neck supple.  Cardiovascular: Normal rate, regular rhythm and normal heart sounds.   Pulmonary/Chest: Effort normal and breath sounds normal. No respiratory distress. She has no wheezes. She has no rales.  Abdominal: Soft. Bowel sounds are normal. She exhibits no distension. There is no tenderness. There is no rebound.  Musculoskeletal: She exhibits no edema.  Neurological: She is alert and oriented to  person, place, and time.  Skin: Skin is warm and dry.  Psychiatric: She has a normal mood and affect. Her behavior is normal.  Nursing note and vitals reviewed.    ED Treatments / Results  Labs (all labs ordered are listed, but only abnormal results are displayed) Labs Reviewed  CBC WITH DIFFERENTIAL/PLATELET - Abnormal; Notable for the following:       Result Value   Hemoglobin 11.2 (*)    HCT 32.9 (*)    Platelets 489 (*)    Monocytes Absolute 1.1 (*)    All other components within normal limits  COMPREHENSIVE METABOLIC PANEL - Abnormal; Notable for the following:    Potassium 3.4 (*)    Chloride 94 (*)    BUN 48 (*)    Creatinine, Ser 3.14 (*)    Calcium >15.0 (*)    Total Protein 6.3 (*)    Albumin 3.2 (*)    GFR calc non Af Amer 14 (*)    GFR calc Af Amer 16 (*)    All other components within normal limits  URINALYSIS, ROUTINE W REFLEX MICROSCOPIC - Abnormal; Notable for the following:    APPearance HAZY (*)    Ketones, ur 5 (*)    Protein, ur 30 (*)    Leukocytes, UA LARGE (*)    Bacteria,  UA RARE (*)    Squamous Epithelial / LPF 0-5 (*)    Non Squamous Epithelial 0-5 (*)    All other components within normal limits  URINE CULTURE  I-STAT TROPOININ, ED  I-STAT CG4 LACTIC ACID, ED    EKG  EKG Interpretation  Date/Time:  Saturday December 27 2015 16:42:14 EST Ventricular Rate:  70 PR Interval:    QRS Duration: 133 QT Interval:  393 QTC Calculation: 424 R Axis:   33 Text Interpretation:  Sinus rhythm Right bundle branch block Confirmed by YELVERTON  MD, DAVID ([id number]) on 12/27/2015 5:47:59 PM       Radiology Dg Chest 2 View  Result Date: 12/27/2015 CLINICAL DATA:  Nausea, cough, possible aspiration EXAM: CHEST  2 VIEW COMPARISON:  03/02/2013 FINDINGS: Cardiomediastinal silhouette is stable. There is moderate partially loculated right pleural effusion. Atelectasis or infiltrate noted in right lower lobe. No pulmonary edema. Left lung is clear. Mild degenerative changes thoracic spine. IMPRESSION: Moderate partially loculated right pleural effusion with atelectasis or infiltrate in right lower lobe. No pulmonary edema. Electronically Signed   By: Lahoma Crocker M.D.   On: 12/27/2015 17:06    Procedures Procedures (including critical care time)  Medications Ordered in ED Medications  sodium chloride 0.9 % bolus 1,000 mL (not administered)     Initial Impression / Assessment and Plan / ED Course  I have reviewed the triage vital signs and the nursing notes.  Pertinent labs & imaging results that were available during my care of the patient were reviewed by me and considered in my medical decision making (see chart for details).  Clinical Course    Patient in emergency department with generalized weakness, diagnosed with stage IV lymphoma several years ago, declined chemotherapy. She is reporting some dysphagia, with choking after eating and drinking. She is frail appearing on exam. Persistent lower extremity paraplegia, oral mucosa dry, start IV fluids, labs  pending.  Patient's potassium is 3.4, creatinine is up to 3.1, last creatinine baseline around 1. Calcium greater than 15. Delay in obtaining CBC, due to lab backup. Finally CBC is back unremarkable with hemoglobin 11.2. Lactic acid, troponin normal. Urinalysis showing  signs of infection, culture sent, will treat with Rocephin 1 g. Discussed results with patient and her family. Patient will need admission for correction of her calcium which is most likely due to her cancer which is involving her spine. Will need hydration.  I discussed with tried, they will admit.  Vitals:   12/27/15 2100 12/27/15 2105 12/27/15 2159 12/28/15 0009  BP: 155/59 155/59 (!) 184/66 (!) 153/54  Pulse: 73 73  72  Resp: 18 15 18    Temp:   98.5 F (36.9 C)   TempSrc:   Oral   SpO2: 97% 96% 98%   Weight:   56.5 kg   Height:   5' (1.524 m)      Final Clinical Impressions(s) / ED Diagnoses   Final diagnoses:  Hyperkalemia  Acute renal failure, unspecified acute renal failure type (HCC)  B-cell lymphoma, unspecified B-cell lymphoma type, unspecified body region [facility])    New Prescriptions Current Discharge Medication List       Jeannett Senior, PA-C 12/28/15 [place], MD 12/28/15 [phone]

## 2015-12-28 ENCOUNTER — Encounter (HOSPITAL_COMMUNITY): Payer: Self-pay

## 2015-12-28 DIAGNOSIS — N182 Chronic kidney disease, stage 2 (mild): Secondary | ICD-10-CM

## 2015-12-28 DIAGNOSIS — N179 Acute kidney failure, unspecified: Secondary | ICD-10-CM

## 2015-12-28 LAB — CBC
HEMATOCRIT: 30.6 % — AB (ref 36.0–46.0)
HEMOGLOBIN: 10.1 g/dL — AB (ref 12.0–15.0)
MCH: 26.9 pg (ref 26.0–34.0)
MCHC: 33 g/dL (ref 30.0–36.0)
MCV: 81.4 fL (ref 78.0–100.0)
Platelets: 451 10*3/uL — ABNORMAL HIGH (ref 150–400)
RBC: 3.76 MIL/uL — AB (ref 3.87–5.11)
RDW: 14.4 % (ref 11.5–15.5)
WBC: 7.3 10*3/uL (ref 4.0–10.5)

## 2015-12-28 LAB — BASIC METABOLIC PANEL
ANION GAP: 8 (ref 5–15)
BUN: 47 mg/dL — ABNORMAL HIGH (ref 6–20)
CO2: 25 mmol/L (ref 22–32)
Calcium: 14.4 mg/dL (ref 8.9–10.3)
Chloride: 104 mmol/L (ref 101–111)
Creatinine, Ser: 2.83 mg/dL — ABNORMAL HIGH (ref 0.44–1.00)
GFR calc non Af Amer: 15 mL/min — ABNORMAL LOW (ref >60)
GFR, EST AFRICAN AMERICAN: 18 mL/min — AB (ref >60)
GLUCOSE: 82 mg/dL (ref 65–99)
POTASSIUM: 3.5 mmol/L (ref 3.5–5.1)
Sodium: 137 mmol/L (ref 135–145)

## 2015-12-28 LAB — CREATININE, URINE, RANDOM: Creatinine, Urine: 96.68 mg/dL

## 2015-12-28 MED ORDER — SODIUM CHLORIDE 0.9 % IV SOLN
60.0000 mg | Freq: Once | INTRAVENOUS | Status: DC
  Filled 2015-12-28: qty 20

## 2015-12-28 MED ORDER — SODIUM CHLORIDE 0.9 % IV SOLN
60.0000 mg | Freq: Once | INTRAVENOUS | Status: AC
  Administered 2015-12-28: 60 mg via INTRAVENOUS
  Filled 2015-12-28: qty 6.67

## 2015-12-28 MED ORDER — CALCITONIN (SALMON) 200 UNIT/ML IJ SOLN
4.0000 [IU]/kg | Freq: Two times a day (BID) | INTRAMUSCULAR | Status: DC
  Administered 2015-12-28 – 2016-01-01 (×9): 226 [IU] via INTRAMUSCULAR
  Filled 2015-12-28 (×10): qty 1.13

## 2015-12-28 NOTE — Progress Notes (Signed)
PROGRESS NOTE  KERAH NEUGENT  [id number] DOB: 22-Oct-1940 DOA: 12/27/2015 PCP: [name], MD  Brief Narrative:   LUCYNDA KIL is a 75 y.o. 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  She declined systemic chemotherapy for cancer and underwent radiation for her thoracic metastatic cancer and cord compression. She was placed on hospice care 3 years ago but was discharged from their service.  She follows up with her primary care doctor now but does not see an oncologist anymore.  She was found to be dehydrated with urinary tract infection hypercalcemia, calcium 15.0.  Chest x-ray showed bilateral pleural effusion and possible atelectasis.  She was started on IV fluids, calcitonin, and pamidronate (given 12/28/2015).  The degree of her hypercalcemia suggests that she is having worsening bony involvement of her malignancy. Her prognosis is poor and I recommended that she resume hospice services at this time.  Continue IV fluids and supportive care until her calcium is normal or near-normal and then anticipate discharge to home with hospice care in the care of her husband.    Assessment & Plan:   Principal Problem:   Hypercalcemia of malignancy Active Problems:   HTN (hypertension)   Alcohol abuse   Diffuse large B cell lymphoma (HCC)   Weakness of both lower extremities   Acute on chronic kidney failure-II   UTI (urinary tract infection)   Generalized weakness   Hypokalemia  UTI (urinary tract infection), present at time of admission  - Continue Ceftriaxone  - Follow up results of urine and blood cx  Hypercalcemia related to malignancy: Ca=15, corrected to 15.6. Mental status  normal. Most likely due to malignancy. Patient is on HCTZ, which may have also contributed partially. - Follow-up intact PTH,  25-hydroxy vitamin D - continue IV fluids - Add calcitriol - Reviewed literature regarding administration of bisphosphonate despite elevated creatinine and discussed case with pharmacist. Administered pamidronate 60 mg IV over slow infusion on 12/17. - Discontinued HCTZ  Hypokalemia: K=3.4 on admission. - Repleted  Difficulty swallowing: Etiology is not clear. -SLP -  Dysphagia to diet pending speech evaluation  HTN: -Discontinued losartan, HCTZ due to worsening renal function and hypercalcemia -Start amlodipine  Diffuse large B cell lymphoma: Refused systemic chemotherapy. S/p of radiation therapy due to thoracic metastatic cancer with cord compression. has sequela of bilateral leg weakness, using wheelchair.  Suspect the cancer has progressed resulting in hypercalcemia malignancy. Prognosis is poor. Recommended she resume hospice care.  AoCKD-III: Baseline creatinine 1.0, her creatinine is 3.14, BUN 48.. Likely due to multifactorial etiology, including prerenal secondary to dehydration and continuation of ARB and diruetics, hypercalcinemia. - IVF as above - Hold Hyzaar  -  Daily BMP  Generalized weakness: Multifactorial etiology, including UTI, hypercalcemia, dehydration, worsening renal function, and malignancy -Trace underlying issues as above -pt/ot - At this time patient is declining skilled nursing facility placement  Alcohol abuse: -Did counseling about the importance of quitting drinking -CIWA protocol  DVT prophylaxis:  Heparin Code Status:  DO NOT RESUSCITATE Family Communication:  Patient alone Disposition Plan:  3-4 days either to home with hospice or possible residential hospice depending on progression.  This case management consult to assist with registering with hospice.   Consultants:   None  Procedures:   None  Antimicrobials:  Anti-infectives    Start     Dose/Rate Route Frequency Ordered Stop   12/28/15 2200  cefTRIAXone (ROCEPHIN) 1 g in dextrose 5 % 50 mL IVPB     1 g 100 mL/hr over 30 Minutes Intravenous Every 24 hours 12/27/15 2108     12/27/15 2115  cefTRIAXone (ROCEPHIN) 1 g in dextrose 5 % 50 mL IVPB     1 g 100 mL/hr over 30 Minutes Intravenous  Once 12/27/15 2105 12/27/15 2222       Subjective: States that she has been very weak at home. Her husband has had to even assist her in turning over in bed. She has had copious urine recently. Denies constipation and states that she has multiple bowel movements daily.  Objective: Vitals:   12/27/15 2105 12/27/15 2159 12/28/15 0009 12/28/15 0502  BP: 155/59 (!) 184/66 (!) 153/54 (!) 147/59  Pulse: 73  72 73  Resp: 15 18  18   Temp:  98.5 F (36.9 C)  98 F (36.7 C)  TempSrc:  Oral  Oral  SpO2: 96% 98%  98%  Weight:  56.5 kg (124 lb 9 oz)    Height:  5' (1.524 m)      Intake/Output Summary (Last 24 hours) at 12/28/15 1334 Last data filed at 12/28/15 1230  Gross per 24 hour  Intake          2972.92 ml  Output              400 ml  Net          2572.92 ml   Filed Weights   12/27/15 1550 12/27/15 2159  Weight: 58.1 kg (128 lb) 56.5 kg (124 lb 9 oz)    Examination:  General exam:  Adult Female.  No acute distress.  HEENT:  NCAT, MMM Respiratory system: Clear to auscultation bilaterally Cardiovascular system: Regular rate and rhythm, normal S1/S2. No murmurs, rubs, gallops or clicks.  Warm extremities Gastrointestinal system: Normal active bowel sounds, soft, nondistended, nontender.  Seems to have a large midline palpable mass MSK:  Normal tone and bulk, no lower extremity edema Neuro:  3-5 bilateral lower extremity strength, paraparesis    Data Reviewed: I have personally reviewed following labs and imaging studies  CBC:  Recent Labs Lab 12/27/15 1653 12/28/15 0520  WBC 8.9 7.3  NEUTROABS 6.6  --   HGB  11.2* 10.1*  HCT 32.9* 30.6*  MCV 79.9 81.4  PLT 489* [id number]*   Basic Metabolic Panel:  Recent Labs Lab 12/27/15 1653 12/28/15 0520  NA 135 137  K 3.4* 3.5  CL 94* 104  CO2 28 25  GLUCOSE 83 82  BUN 48* 47*  CREATININE 3.14* 2.83*  CALCIUM >15.0* 14.4*   GFR: Estimated Creatinine Clearance: 13.7 mL/min (by C-G formula based on SCr of 2.83 mg/dL (H)). Liver Function Tests:  Recent Labs Lab 12/27/15 1653  AST 23  ALT 20  ALKPHOS 91  BILITOT 0.9  PROT 6.3*  ALBUMIN 3.2*   No results for input(s): LIPASE, AMYLASE in the last 168 hours. No results for input(s): AMMONIA in the last 168 hours. Coagulation Profile: No results for input(s): INR, PROTIME in the last 168 hours. Cardiac Enzymes: No results for input(s): CKTOTAL, CKMB, CKMBINDEX, TROPONINI in the last 168 hours. BNP (last 3 results) No results for input(s): PROBNP in the last 8760 hours. HbA1C: No results for input(s): HGBA1C in the last 72 hours. CBG: No results for input(s): GLUCAP in the last 168  hours. Lipid Profile: No results for input(s): CHOL, HDL, LDLCALC, TRIG, CHOLHDL, LDLDIRECT in the last 72 hours. Thyroid Function Tests: No results for input(s): TSH, T4TOTAL, FREET4, T3FREE, THYROIDAB in the last 72 hours. Anemia Panel: No results for input(s): VITAMINB12, FOLATE, FERRITIN, TIBC, IRON, RETICCTPCT in the last 72 hours. Urine analysis:    Component Value Date/Time   COLORURINE YELLOW 12/27/2015 1800   APPEARANCEUR HAZY (A) 12/27/2015 1800   LABSPEC 1.011 12/27/2015 1800   PHURINE 6.0 12/27/2015 1800   GLUCOSEU NEGATIVE 12/27/2015 1800   HGBUR NEGATIVE 12/27/2015 1800   BILIRUBINUR NEGATIVE 12/27/2015 1800   KETONESUR 5 (A) 12/27/2015 1800   PROTEINUR 30 (A) 12/27/2015 1800   UROBILINOGEN 0.2 04/15/2013 1338   NITRITE NEGATIVE 12/27/2015 1800   LEUKOCYTESUR LARGE (A) 12/27/2015 1800   Sepsis Labs: @LABRCNTIP (procalcitonin:4,lacticidven:4)  )No results found for this or any previous  visit (from the past 240 hour(s)).    Radiology Studies: Dg Chest 2 View  Result Date: 12/27/2015 CLINICAL DATA:  Nausea, cough, possible aspiration EXAM: CHEST  2 VIEW COMPARISON:  03/02/2013 FINDINGS: Cardiomediastinal silhouette is stable. There is moderate partially loculated right pleural effusion. Atelectasis or infiltrate noted in right lower lobe. No pulmonary edema. Left lung is clear. Mild degenerative changes thoracic spine. IMPRESSION: Moderate partially loculated right pleural effusion with atelectasis or infiltrate in right lower lobe. No pulmonary edema. Electronically Signed   By: Lahoma Crocker M.D.   On: 12/27/2015 17:06     Scheduled Meds: . amLODipine  10 mg Oral Daily  . cefTRIAXone (ROCEPHIN)  IV  1 g Intravenous Q24H  . folic acid  1 mg Oral Daily  . heparin  5,000 Units Subcutaneous Q8H  . LORazepam  0-4 mg Intravenous Q6H   Followed by  . [START ON 12/30/2015] LORazepam  0-4 mg Intravenous Q12H  . multivitamin with minerals  1 tablet Oral Daily  . pamidronate  60 mg Intravenous Once  . sodium chloride flush  3 mL Intravenous Q12H  . thiamine  100 mg Oral Daily   Or  . thiamine  100 mg Intravenous Daily   Continuous Infusions: . sodium chloride 125 mL/hr at 12/28/15 1101     LOS: 1 day    Time spent: 30 min    Janece Canterbury, MD [facility] Pager [phone]  If 7PM-7AM, please contact night-coverage [email or web address] Password [email or web address] 12/28/2015, 1:34 PM

## 2015-12-28 NOTE — Evaluation (Signed)
Physical Therapy Evaluation Patient Details Name: Ashley West MRN: [id number] DOB: 07-Oct-1940 Today's Date: 12/28/2015   History of Present Illness  75 yo female admitted with UTI. Hx of stage IV lymphoma with mets to spine and cord compression, ETOH abuse.   Clinical Impression  Bed level eval only. Limited by pt's unwillingness to participate during eval. Discussed d/c plan-pt states she plans to d/c home with husband providing assistance. During history taking, pt repeatedly stated she can't move around. She stated she hasn't been able to transfer for last week or so. Based on this info, unsure if husband can provide current level of care. Recommendation is for SNF at this time provided pt is agreeable and willing to participate with therapies.     Follow Up Recommendations SNF    Equipment Recommendations  Hospital bed;3in1 (PT)    Recommendations for Other Services OT consult     Precautions / Restrictions Precautions Precautions: Fall Restrictions Weight Bearing Restrictions: No      Mobility  Bed Mobility Overal bed mobility: Needs Assistance Bed Mobility: Rolling Rolling: Mod assist         General bed mobility comments: Pt not very cooperative. Would only agree to roll to R side partially. Pt refused to roll to L side completely. Max cueing for minimal participation.    Transfers                   Ambulation/Gait                Stairs            Wheelchair Mobility    Modified Rankin (Stroke Patients Only)       Balance                                             Pertinent Vitals/Pain Pain Assessment: No/denies pain    Home Living Family/patient expects to be discharged to:: Unsure Living Arrangements: Spouse/significant other                    Prior Function Level of Independence: Needs assistance   Gait / Transfers Assistance Needed: transferring to/from wheelchair, on/off commode with  assistance from husband. Pt has been unable to transfer for last week or so prior to admission.           Hand Dominance        Extremity/Trunk Assessment        Lower Extremity Assessment Lower Extremity Assessment: RLE deficits/detail;LLE deficits/detail RLE Deficits / Details: Df/PF at least 2/5, hip flexion ~2/5. Unable to fully assess due to lack of pt participation LLE Deficits / Details: DF/PF at least 2/5, hip flexion ~2/5. Unable to fully assess due to lack of pt participation       Communication   Communication: No difficulties  Cognition Arousal/Alertness: Awake/alert Behavior During Therapy: WFL for tasks assessed/performed (but not very cooperative) Overall Cognitive Status: Within Functional Limits for tasks assessed                      General Comments  Pt generally uncooperative during session. At times, pt would ignore therapist's commands. Lunch arrived as session started. Pt even declined set-up assistance for lunch when therapist offered, at end of session-" No, I gotta rest."    Exercises     Assessment/Plan  PT Assessment Patient needs continued PT services  PT Problem List Decreased strength;Decreased mobility;Decreased activity tolerance;Decreased balance;Decreased coordination          PT Treatment Interventions Functional mobility training;Therapeutic activities;Therapeutic exercise;Patient/family education;Balance training;DME instruction    PT Goals (Current goals can be found in the Care Plan section)  Acute Rehab PT Goals Patient Stated Goal: home PT Goal Formulation: With patient Time For Goal Achievement: 01/11/16 Potential to Achieve Goals: Fair    Frequency     Barriers to discharge        Co-evaluation               End of Session   Activity Tolerance: Patient tolerated treatment well Patient left: in bed;with call bell/phone within reach;with bed alarm set;with family/visitor present            Time: 1318-1330 PT Time Calculation (min) (ACUTE ONLY): 12 min   Charges:   PT Evaluation $PT Eval Low Complexity: 1 Procedure     PT G Codes:        Weston Anna, MPT Pager: [phone]

## 2015-12-28 NOTE — Progress Notes (Signed)
CRITICAL VALUE STICKER  CRITICAL VALUE: Calcium 14.4  RECEIVER (on-site recipient of call): Gifford NOTIFIED: 0620  MESSENGER (representative from lab): Ronny Bacon  MD NOTIFIED: Hal Hope  TIME OF NOTIFICATION: [phone]  RESPONSE:

## 2015-12-28 NOTE — Progress Notes (Signed)
SLP Cancellation Note  Patient Details Name: ARDELL FARNWORTH MRN: [id number] DOB: 1940-02-27   Cancelled treatment:       Reason Eval/Treat Not Completed: Other (comment) Order received for swallow evaluation. Per discussion with RN, pt appears to be tolerating current diet without overt signs of difficulty. Will plan to see on next date.   Germain Osgood 12/28/2015, 3:22 PM  Germain Osgood, M.A. CCC-SLP [phone]

## 2015-12-29 DIAGNOSIS — N183 Chronic kidney disease, stage 3 (moderate): Secondary | ICD-10-CM

## 2015-12-29 DIAGNOSIS — I10 Essential (primary) hypertension: Secondary | ICD-10-CM

## 2015-12-29 DIAGNOSIS — C8330 Diffuse large B-cell lymphoma, unspecified site: Secondary | ICD-10-CM

## 2015-12-29 DIAGNOSIS — E876 Hypokalemia: Secondary | ICD-10-CM

## 2015-12-29 DIAGNOSIS — N170 Acute kidney failure with tubular necrosis: Secondary | ICD-10-CM

## 2015-12-29 LAB — GLUCOSE, CAPILLARY: GLUCOSE-CAPILLARY: 93 mg/dL (ref 65–99)

## 2015-12-29 LAB — CBC
HCT: 29.2 % — ABNORMAL LOW (ref 36.0–46.0)
Hemoglobin: 9.6 g/dL — ABNORMAL LOW (ref 12.0–15.0)
MCH: 26.8 pg (ref 26.0–34.0)
MCHC: 32.9 g/dL (ref 30.0–36.0)
MCV: 81.6 fL (ref 78.0–100.0)
PLATELETS: 443 10*3/uL — AB (ref 150–400)
RBC: 3.58 MIL/uL — AB (ref 3.87–5.11)
RDW: 14.3 % (ref 11.5–15.5)
WBC: 6.7 10*3/uL (ref 4.0–10.5)

## 2015-12-29 LAB — UREA NITROGEN, URINE: Urea Nitrogen, Ur: 646 mg/dL

## 2015-12-29 LAB — BASIC METABOLIC PANEL
Anion gap: 9 (ref 5–15)
BUN: 36 mg/dL — AB (ref 6–20)
CALCIUM: 12.4 mg/dL — AB (ref 8.9–10.3)
CO2: 22 mmol/L (ref 22–32)
CREATININE: 2.24 mg/dL — AB (ref 0.44–1.00)
Chloride: 111 mmol/L (ref 101–111)
GFR calc non Af Amer: 20 mL/min — ABNORMAL LOW (ref >60)
GFR, EST AFRICAN AMERICAN: 24 mL/min — AB (ref >60)
GLUCOSE: 111 mg/dL — AB (ref 65–99)
Potassium: 3.3 mmol/L — ABNORMAL LOW (ref 3.5–5.1)
Sodium: 142 mmol/L (ref 135–145)

## 2015-12-29 LAB — URINE CULTURE

## 2015-12-29 LAB — PARATHYROID HORMONE, INTACT (NO CA): PTH: 18 pg/mL (ref 15–65)

## 2015-12-29 LAB — VITAMIN D 25 HYDROXY (VIT D DEFICIENCY, FRACTURES): VIT D 25 HYDROXY: 30.1 ng/mL (ref 30.0–100.0)

## 2015-12-29 MED ORDER — POTASSIUM CHLORIDE CRYS ER 20 MEQ PO TBCR
40.0000 meq | EXTENDED_RELEASE_TABLET | Freq: Once | ORAL | Status: AC
  Administered 2015-12-29: 40 meq via ORAL
  Filled 2015-12-29: qty 2

## 2015-12-29 MED ORDER — BOOST / RESOURCE BREEZE PO LIQD
1.0000 | Freq: Three times a day (TID) | ORAL | Status: DC
  Administered 2015-12-29: 1 via ORAL
  Administered 2015-12-30: 237 mL via ORAL
  Administered 2015-12-30 – 2015-12-31 (×2): 1 via ORAL

## 2015-12-29 NOTE — Progress Notes (Signed)
OT Cancellation Note  Patient Details Name: Ashley West MRN: [id number] DOB: 1940-05-28   Cancelled Treatment:    Reason Eval/Treat Not Completed: Other (comment)   OT encouraged pt to participated with OT but pt adamantly refused stating ' I cant do anything '  Explained why moving and participating important but pt continued to refused.  Not sure pt will participate with therapy- but will check on pt next day.  Kari Baars, Martin Payton Mccallum D 12/29/2015, 5:18 PM

## 2015-12-29 NOTE — NC FL2 (Signed)
[place] MEDICAID FL2 LEVEL OF CARE SCREENING TOOL     IDENTIFICATION  Patient Name: Ashley West Birthdate: [birth year] Sex: female Admission Date (Current Location): 12/27/2015  [facility] and [place] Number:  Herbalist and Address:  [facility],  [place] [address] [place]      Provider Number: [id number]  Attending Physician Name and Address:  Robbie Lis, MD  Relative Name and Phone Number:       Current Level of Care: Hospital Recommended Level of Care: [place] Prior Approval Number:    Date Approved/Denied:   PASRR Number: [id number]  Discharge Plan: SNF    Current Diagnoses: Patient Active Problem List   Diagnosis Date Noted  . Acute on chronic kidney failure-II 12/27/2015  . Hypercalcemia of malignancy 12/27/2015  . UTI (urinary tract infection) 12/27/2015  . Generalized weakness 12/27/2015  . Hypokalemia 12/27/2015  . B-cell lymphoma ([place])   . Paraplegia ([place]) 04/15/2013  . Weakness of lower extremity 04/15/2013  . Weakness of both lower extremities 03/08/2013  . Diffuse large B cell lymphoma ([place]) 03/06/2013  . Acute respiratory failure ([place]) 02/24/2013  . Anemia 02/24/2013  . Diverticulosis of colon with hemorrhage 04/20/2012  . HTN (hypertension) 04/19/2012  . Alcohol abuse 04/19/2012    Orientation RESPIRATION BLADDER Height & Weight     Self, Time, Situation, Place  Normal Continent Weight: 124 lb 9 oz (56.5 kg) Height:  5' (152.4 cm)  BEHAVIORAL SYMPTOMS/MOOD NEUROLOGICAL BOWEL NUTRITION STATUS      Continent Diet (Dys 2 Diet)  AMBULATORY STATUS COMMUNICATION OF NEEDS Skin   Extensive Assist Verbally Normal                       Personal Care Assistance Level of Assistance  Bathing, Dressing Bathing Assistance: Limited assistance   Dressing Assistance: Limited assistance     Functional Limitations Info             SPECIAL CARE FACTORS FREQUENCY  PT (By licensed PT), OT  (By licensed OT)     PT Frequency: 5 OT Frequency: 5            Contractures      Additional Factors Info  Code Status, Allergies Code Status Info: DNR Allergies Info: NKDA           Current Medications (12/29/2015):  This is the current hospital active medication list Current Facility-Administered Medications  Medication Dose Route Frequency Provider Last Rate Last Dose  . 0.9 %  sodium chloride infusion   Intravenous Continuous Ivor Costa, MD 125 mL/hr at 12/28/15 1101    . acetaminophen (TYLENOL) tablet 650 mg  650 mg Oral Q6H PRN Ivor Costa, MD       Or  . acetaminophen (TYLENOL) suppository 650 mg  650 mg Rectal Q6H PRN Ivor Costa, MD      . amLODipine (NORVASC) tablet 10 mg  10 mg Oral Daily Ivor Costa, MD   10 mg at 12/29/15 1017  . calcitonin (MIACALCIN) injection 226 Units  4 Units/kg Intramuscular BID Janece Canterbury, MD   226 Units at 12/29/15 1027  . cefTRIAXone (ROCEPHIN) 1 g in dextrose 5 % 50 mL IVPB  1 g Intravenous Q24H Ivor Costa, MD   1 g at [id number]  . folic acid (FOLVITE) tablet 1 mg  1 mg Oral Daily Ivor Costa, MD   1 mg at 12/29/15 1017  . heparin injection 5,000 Units  5,000  Units Subcutaneous Q8H Ivor Costa, MD   5,000 Units at 12/29/15 0515  . hydrALAZINE (APRESOLINE) injection 5 mg  5 mg Intravenous Q2H PRN Ivor Costa, MD      . LORazepam (ATIVAN) injection 0-4 mg  0-4 mg Intravenous Q6H Ivor Costa, MD       Followed by  . [START ON 12/30/2015] LORazepam (ATIVAN) injection 0-4 mg  0-4 mg Intravenous Q12H Ivor Costa, MD      . LORazepam (ATIVAN) tablet 1 mg  1 mg Oral Q6H PRN Ivor Costa, MD       Or  . LORazepam (ATIVAN) injection 1 mg  1 mg Intravenous Q6H PRN Ivor Costa, MD      . multivitamin with minerals tablet 1 tablet  1 tablet Oral Daily Ivor Costa, MD   1 tablet at 12/29/15 1017  . ondansetron (ZOFRAN) tablet 4 mg  4 mg Oral Q6H PRN Ivor Costa, MD       Or  . ondansetron [facility]) injection 4 mg  4 mg Intravenous Q6H PRN Ivor Costa, MD   4 mg at  12/29/15 0335  . sodium chloride flush (NS) 0.9 % injection 3 mL  3 mL Intravenous Q12H Ivor Costa, MD   3 mL at 12/28/15 2200  . thiamine (VITAMIN B-1) tablet 100 mg  100 mg Oral Daily Ivor Costa, MD   100 mg at 12/29/15 1016   Or  . thiamine (B-1) injection 100 mg  100 mg Intravenous Daily Ivor Costa, MD      . zolpidem (AMBIEN) tablet 5 mg  5 mg Oral QHS PRN Ivor Costa, MD   5 mg at 12/29/15 0019     Discharge Medications: Please see discharge summary for a list of discharge medications.  Relevant Imaging Results:  Relevant Lab Results:   Additional Information SSN: [id number]  Standley Brooking, LCSW

## 2015-12-29 NOTE — Progress Notes (Addendum)
Initial Nutrition Assessment  DOCUMENTATION CODES:   Not applicable  INTERVENTION:  - Will order Boost Breeze TID, each supplement provides 250 kcal and 9 grams of protein - Continue to encourage PO intakes of meals and supplement. - RD will continue to monitor POC/GOC and provide interventions as warranted.   NUTRITION DIAGNOSIS:   Inadequate oral intake related to acute illness, poor appetite as evidenced by per patient/family report  GOAL:   Patient will meet greater than or equal to 90% of their needs  MONITOR:   PO intake, Supplement acceptance, Weight trends, Labs, I & O's  REASON FOR ASSESSMENT:   Malnutrition Screening Tool  ASSESSMENT:   75 y.o. 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  She declined systemic chemotherapy for cancer and underwent radiation for her thoracic metastatic cancer and cord compression. She was found to be dehydrated with urinary tract infection hypercalcemia, calcium 15.0.  Chest x-ray showed bilateral pleural effusion and possible atelectasis.  The degree of her hypercalcemia suggests that she is having worsening bony involvement of her malignancy. Her prognosis is poor.  Pt seen for MST. BMI indicates normal weight. Per chart review, she consumed 20% of breakfast this AM.  She states that she has been very tired this afternoon and that she did not have anything for lunch d/t this and lack of desire to consume anything. Notes from MDs and SLP reviewed in detail. SLP note indicates pt desires to have Boost Breeze supplement; will order as outlined above. RN brought in a [employer] at time of RD visit.   Unable to perform physical assessment  at this time. No weight in chart since 2015 which shows weight at this time is consistent with weight at that time (126 lbs on 07/06/13 and currently 124 lbs). Will continue to monitor weight trends during hospitalization. Unable to state malnutrition based on available information at this time.   Medications reviewed; 1 mg oral folic acid/day, daily multivitamin with minerals, PRN Zofran, 40 mEq oral KCl x1 dose today, 100 mg thiamine/day.  Labs reviewed; K: 3.3 mmol/L, BUN: 36 mg/dL, creatinine: 2.24 mg/dL, GFR: 24 mL/min.   IVF: NS @ 125 mL/hr.    Diet Order:  DIET DYS 2 Room service appropriate? Yes; Fluid consistency: Thin  Skin:  Reviewed, no issues  Last BM:  12/18  Height:   Ht Readings from Last 1 Encounters:  12/27/15 5' (1.524 m)    Weight:   Wt Readings from Last 1 Encounters:  12/27/15 124 lb 9 oz (56.5 kg)    Ideal Body Weight:  45.45 kg  BMI:  Body mass index is 24.33 kg/m.  Estimated Nutritional Needs:   Kcal:  1415-1585 (25-28 kcal/kg)  Protein:  57-68 grams (1-1.2 grams/kg)  Fluid:  1.6-1.8 L/day  EDUCATION NEEDS:   No education needs identified at this time    Jarome Matin, MS, RD, LDN, CNSC Inpatient Clinical Dietitian Pager # [phone] After hours/weekend pager # [phone]

## 2015-12-29 NOTE — Clinical Social Work Note (Signed)
Clinical Social Work Assessment  Patient Details  Name: Ashley West MRN: [id number] Date of Birth: May 06, 1940  Date of referral:  12/29/15               Reason for consult:  Facility Placement                Permission sought to share information with:  Chartered certified accountant granted to share information::  Yes, Verbal Permission Granted  Name::        Agency::     Relationship::     Contact Information:     Housing/Transportation Living arrangements for the past 2 months:  Single Family Home Source of Information:  Patient, Spouse Patient Interpreter Needed:  None Criminal Activity/Legal Involvement Pertinent to Current Situation/Hospitalization:  No - Comment as needed Significant Relationships:  Spouse Lives with:  Spouse Do you feel safe going back to the place where you live?  No Need for family participation in patient care:  Yes (Comment)  Care giving concerns:  CSW reviewed PT evaluation recommending SNF at discharge.    Social Worker assessment / plan:  CSW spoke with patient's husband, Ashley West re: discharge planning - patient's husband states that he is unsure what they plan to do at discharge but is agreeable with having her information sent out to [facility] SNFs to see which facilities have bed availability.   Employment status:  Retired Nurse, adult PT Recommendations:  [place] / Referral to community resources:  [place]  Patient/Family's Response to care:  Patient's husband states that she had been to [facility] in the past, CSW confirmed with Tammy at [facility] though that they are not in-network with her insurance - [employer] (silverback). CSW will follow-up with SNF bed offers.   Patient/Family's Understanding of and Emotional Response to Diagnosis, Current Treatment, and Prognosis:    Emotional Assessment Appearance:  Appears stated  age Attitude/Demeanor/Rapport:    Affect (typically observed):    Orientation:  Oriented to Self, Oriented to Place, Oriented to  Time, Oriented to Situation Alcohol / Substance use:    Psych involvement (Current and /or in the community):     Discharge Needs  Concerns to be addressed:    Readmission within the last 30 days:    Current discharge risk:    Barriers to Discharge:      Standley Brooking, LCSW 12/29/2015, 11:25 AM

## 2015-12-29 NOTE — Evaluation (Signed)
Clinical/Bedside Swallow Evaluation Patient Details  Name: Ashley West MRN: [id number] Date of Birth: 10-31-1940  Today's Date: 12/29/2015 Time: SLP Start Time (ACUTE ONLY): 0856 SLP Stop Time (ACUTE ONLY): 0920 SLP Time Calculation (min) (ACUTE ONLY): 24 min  Past Medical History:  Past Medical History:  Diagnosis Date  . Diffuse large B cell lymphoma ([place])   . GERD (gastroesophageal reflux disease)   . Hiatal hernia   . Hypertension   . Weakness of both lower extremities 03/08/2013   Past Surgical History:  Past Surgical History:  Procedure Laterality Date  . ABDOMINAL HYSTERECTOMY    . CATARACT EXTRACTION     Bilateral  . CYST EXCISION     Left Elbow, Arm and back  . ESOPHAGOGASTRODUODENOSCOPY N/A 04/20/2012   Procedure: ESOPHAGOGASTRODUODENOSCOPY (EGD);  Surgeon: Cleotis Nipper, MD;  Location: [facility] ENDOSCOPY;  Service: Endoscopy;  Laterality: N/A;  . LAMINECTOMY N/A 02/22/2013   Procedure: THORACIC LAMINECTOMY FOR TUMOR;  Surgeon: Erline Levine, MD;  Location: [facility] NEURO ORS;  Service: Neurosurgery;  Laterality: N/A;   HPI:  75 yo female adm to [facility] with hypercalcemia, found to have UTI.  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  Pt has not seen oncology for her lymphoma.  Swallow evaluation ordered.  CXR showed bilateral pleural effusions, ? RLL ATX.     Assessment / Plan / Recommendation Clinical Impression  Pt reports premorbid dysphagia requiring her to masticate large pills her entire adult life.  Negative CN exam for oropharyngeal swallow musculature.  Pt has partials but reports she has not worn them lately as they cause her to gag.  Limited po observed - Boost Breeze x 3 ounces and fruit bolus x1.   Pt with timely swallow of liquids and no indication of aspiration/airway compromise.  Slow but effective mastication of solids and pt used liquids to orally transit solid.     Ashley West admits  to "not being hungry" and weight loss.  She reports gagging with intake - both solids and liquids- and complains of issues with secretions over the last few weeks.  SLP reviewed dysphagia mitigation strategies including taking large pills with food and maximizing liquid nutrition.  Of note, pt states she is lactose intolerant and requests Boost Breeze- not milk based nutritional supplements.     SLP inquired to pt if she desired dietary advancement and reviewed items on Dys3 diet - pt then stated "Just leave me on what I'm on, I can't handle that".  Pt then layed on her side and closed her eyes- not talking to SLP further.  Will follow up x1 to determine po tolerance and pt's readiness for advancement.  Thanks for this referral.     Aspiration Risk  Mild aspiration risk    Diet Recommendation Dysphagia 2 (Fine chop);Thin liquid   Liquid Administration via: Cup;Straw Medication Administration: Other (Comment) (if large and not contraindicated, crush) Supervision: Patient able to self feed Compensations: Slow rate;Small sips/bites Postural Changes: Seated upright at 90 degrees;Remain upright for at least 30 minutes after po intake    Other  Recommendations Oral Care Recommendations: Oral care BID   Follow up Recommendations        Frequency and Duration min 1 x/week  1 week       Prognosis Prognosis for Safe Diet Advancement: Fair Barriers to Reach Goals: Time post onset      Swallow Study  General Date of Onset: 12/29/15 HPI: 75 yo female adm to [facility] with hypercalcemia, found to have UTI.  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  Pt has not seen oncology for her lymphoma.  Swallow evaluation ordered.  CXR showed bilateral pleural effusions, ? RLL ATX.   Type of Study: Bedside Swallow Evaluation Diet Prior to this Study: Dysphagia 2 (chopped);Thin liquids Temperature Spikes Noted: Yes (low  grade) Respiratory Status: Room air History of Recent Intubation: No Behavior/Cognition: Alert;Cooperative;Pleasant mood Oral Cavity Assessment: Dry Oral Care Completed by SLP: No Oral Cavity - Dentition: Missing dentition (pt reports her partials make her gag) Vision: Functional for self-feeding Self-Feeding Abilities: Able to feed self Patient Positioning: Upright in bed Baseline Vocal Quality: Normal Volitional Cough: Other (Comment) (DNT) Volitional Swallow: Able to elicit    Oral/Motor/Sensory Function Overall Oral Motor/Sensory Function: Generalized oral weakness   Ice Chips Ice chips: Not tested   Thin Liquid Thin Liquid: Within functional limits Presentation: Self Fed;Straw    Nectar Thick Nectar Thick Liquid: Not tested   Honey Thick Honey Thick Liquid: Not tested   Puree Puree: Not tested Other Comments: per RN pt declines to consume grits or applesauce for breakfast, pt stated grits were "nasty"   Solid   GO   Solid: Impaired Presentation: Spoon Oral Phase Impairments: Poor awareness of bolus;Impaired mastication Oral Phase Functional Implications: Impaired mastication (prolonged mastication) Other Comments: pt effectively cleared independently        Ashley West, [facility] SLP [phone]

## 2015-12-29 NOTE — Plan of Care (Signed)
Problem: Safety: Goal: Ability to remain free from injury will improve Outcome: Progressing Pt remains free from harm at this time. Bed alarm turned on and proper toileting equipment utilized.

## 2015-12-29 NOTE — Progress Notes (Signed)
Patient ID: Ashley West, female   DOB: 1940/09/23, 75 y.o.   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 Patient declined systemic chemotherapy for cancer and underwent radiation for her thoracic metastatic cancer and cord compression. She was placed on hospice care 3 years ago but was discharged from their service.   She follows up with her primary care doctor now but does not see an oncologist anymore.   She was found to be dehydrated on admission and with urinary tract infection, hypercalcemia with calcium 15.0. Chest x-ray showed bilateral pleural effusion and possible atelectasis.  She was started on IV fluids, calcitonin, and pamidronate (given 12/28/2015).     Assessment & Plan:  UTI (urinary tract infection) - Continue Ceftriaxone  - Blood cultures are pending and urine culture so far with multiple species, non-predominant  Hypercalcemia related to malignancy - Calcium 12.4 this morning, slowly improving - PTH is 18, within normal limits although on lower end of normal range - She has received the medronate, Calcitonin, IV fluids -  Continue to monitor calcium level   Hypokalemia - Repleted  Difficulty swallowing - Per SLP - dysphagia 2 diet   Essential hypertension - Discontinued losartan, HCTZ due to worsening renal function and hypercalcemia - Started amlodipine - BP 146/64  Diffuse large B cell lymphoma - Refused systemic chemotherapy. S/p of radiation therapy due to thoracic metastatic cancer with cord  compression. hassequela of bilateral leg weakness, using wheelchair.  Suspect the cancer has progressed resulting in hypercalcemia malignancy. Prognosis is poor. Recommended she resumes hospice care.  AoCKD-III - Baseline creatinine 1.0 - On admission, Creatinine is 3.14, likely deu to dehydration, diuretics, hypercalcemia - Cr 2.24, slowly improving  - Hold Hyzaar   Generalized weakness - Likely due to combination of UTI, hypercalcemia, dehydration, worsening renal function, and malignancy  Alcohol abuse: - No reports of withdrawals - Continue CIWA protocol    DVT prophylaxis:  Heparin Code Status:  DNR/DNI Family Communication:  Family not at the bedside this am Disposition Plan:  Possibly in am if calcium further improved    Consultants:   SLP  Procedures:   None  Antimicrobials:   Rocephin   Subjective: No overnight events, has not slept well last night.   Objective: Vitals:   12/28/15 0502 12/28/15 1402 12/28/15 2022 12/29/15 0636  BP: (!) 147/59 (!) 149/67 (!) 162/70 (!) 146/64  Pulse: 73 78 79 76  Resp: 18 18 18 18   Temp: 98 F (36.7 C) 98.3 F (36.8 C) 98.7 F (37.1 C) 99.2 F (37.3 C)  TempSrc: Oral Oral Oral Oral  SpO2: 98% 97% 97% 98%  Weight:      Height:        Intake/Output Summary (Last 24 hours) at 12/29/15 1303 Last data filed at 12/29/15 1118  Gross per 24 hour  Intake             3170 ml  Output              450 ml  Net             2720 ml  Filed Weights   12/27/15 1550 12/27/15 2159  Weight: 58.1 kg (128 lb) 56.5 kg (124 lb 9 oz)    Examination:  General exam: Appears calm and comfortable  Respiratory system: Clear to auscultation. Respiratory effort normal. Cardiovascular system: S1 & S2 heard, RRR. No pedal edema. Gastrointestinal system: Abdomen is nondistended, soft and nontender. No organomegaly or masses felt. Normal bowel sounds heard. Central nervous system: Alert and oriented. No focal neurological  deficits. Extremities: Symmetric 5 x 5 power. Skin: No rashes, lesions or ulcers Psychiatry: Judgement and insight appear normal. Mood & affect appropriate.   Data Reviewed: I have personally reviewed following labs and imaging studies  CBC:  Recent Labs Lab 12/27/15 1653 12/28/15 0520 12/29/15 0557  WBC 8.9 7.3 6.7  NEUTROABS 6.6  --   --   HGB 11.2* 10.1* 9.6*  HCT 32.9* 30.6* 29.2*  MCV 79.9 81.4 81.6  PLT 489* 451* 123456*   Basic Metabolic Panel:  Recent Labs Lab 12/27/15 1653 12/28/15 0520 12/29/15 0557  NA 135 137 142  K 3.4* 3.5 3.3*  CL 94* 104 111  CO2 28 25 22   GLUCOSE 83 82 111*  BUN 48* 47* 36*  CREATININE 3.14* 2.83* 2.24*  CALCIUM >15.0* 14.4* 12.4*   GFR: Estimated Creatinine Clearance: 17.4 mL/min (by C-G formula based on SCr of 2.24 mg/dL (H)). Liver Function Tests:  Recent Labs Lab 12/27/15 1653  AST 23  ALT 20  ALKPHOS 91  BILITOT 0.9  PROT 6.3*  ALBUMIN 3.2*   No results for input(s): LIPASE, AMYLASE in the last 168 hours. No results for input(s): AMMONIA in the last 168 hours. Coagulation Profile: No results for input(s): INR, PROTIME in the last 168 hours. Cardiac Enzymes: No results for input(s): CKTOTAL, CKMB, CKMBINDEX, TROPONINI in the last 168 hours. BNP (last 3 results) No results for input(s): PROBNP in the last 8760 hours. HbA1C: No results for input(s): HGBA1C in the last 72 hours. CBG:  Recent Labs Lab 12/29/15 0744  GLUCAP 93   Lipid Profile: No results for input(s): CHOL, HDL, LDLCALC, TRIG, CHOLHDL, LDLDIRECT in the last 72 hours. Thyroid Function Tests: No results for input(s): TSH, T4TOTAL, FREET4, T3FREE, THYROIDAB in the last 72 hours. Anemia Panel: No results for input(s): VITAMINB12, FOLATE, FERRITIN, TIBC, IRON, RETICCTPCT in the last 72 hours. Urine analysis:    Component Value Date/Time   COLORURINE YELLOW 12/27/2015 1800   APPEARANCEUR HAZY (A) 12/27/2015 1800   LABSPEC 1.011 12/27/2015 1800    PHURINE 6.0 12/27/2015 1800   GLUCOSEU NEGATIVE 12/27/2015 1800   HGBUR NEGATIVE 12/27/2015 1800   BILIRUBINUR NEGATIVE 12/27/2015 1800   KETONESUR 5 (A) 12/27/2015 1800   PROTEINUR 30 (A) 12/27/2015 1800   UROBILINOGEN 0.2 04/15/2013 1338   NITRITE NEGATIVE 12/27/2015 1800   LEUKOCYTESUR LARGE (A) 12/27/2015 1800   Sepsis Labs: @LABRCNTIP (procalcitonin:4,lacticidven:4)  Recent Results (from the past 240 hour(s))  Urine culture     Status: Abnormal   Collection Time: 12/27/15  6:00 PM  Result Value Ref Range Status   Specimen Description URINE, RANDOM  Final   Special Requests NONE  Final   Culture MULTIPLE SPECIES PRESENT, SUGGEST RECOLLECTION (A)  Final   Report Status 12/29/2015 FINAL  Final      Radiology Studies: Dg Chest 2 View Result Date: 12/27/2015 Moderate partially loculated right pleural effusion with atelectasis or infiltrate in right lower lobe. No pulmonary edema. Electronically Signed   By: Lahoma Crocker M.D.   On: 12/27/2015 17:06  Scheduled Meds: . amLODipine  10 mg Oral Daily  . calcitonin  4 Units/kg Intramuscular BID  . cefTRIAXone (ROCEPHIN)  IV  1 g Intravenous Q24H  . folic acid  1 mg Oral Daily  . heparin  5,000 Units Subcutaneous Q8H  . LORazepam  0-4 mg Intravenous Q6H   Followed by  .  LORazepam  0-4 mg Intravenous Q12H  . multivitamin with   1 tablet Oral Daily  . thiamine  100 mg Oral Daily   Or  . thiamine  100 mg Intravenous Daily   Continuous Infusions: . sodium chloride 125 mL/hr at 12/29/15 1142     LOS: 2 days    Time spent: 25 minutes  Greater than 50% of the time spent on counseling and coordinating the care.   Leisa Lenz, MD [facility] Pager [phone]  If 7PM-7AM, please contact night-coverage [email or web address] Password [email or web address] 12/29/2015, 1:03 PM

## 2015-12-29 NOTE — Clinical Social Work Placement (Signed)
   CLINICAL SOCIAL WORK PLACEMENT  NOTE  Date:  12/29/2015  Patient Details  Name: Ashley West MRN: [id number] Date of Birth: 26-Mar-1940  Clinical Social Work is seeking post-discharge placement for this patient at the [place] level of care (*CSW will initial, date and re-position this form in  chart as items are completed):  Yes   Patient/family provided with [place] Work Department's list of facilities offering this level of care within the geographic area requested by the patient (or if unable, by the patient's family).  Yes   Patient/family informed of their freedom to choose among providers that offer the needed level of care, that participate in Medicare, Medicaid or managed care program needed by the patient, have an available bed and are willing to accept the patient.  Yes   Patient/family informed of [facility]'s ownership interest in [facility] and [facility], as well as of the fact that they are under no obligation to receive care at these facilities.  PASRR submitted to EDS on       PASRR number received on       Existing PASRR number confirmed on 12/29/15     FL2 transmitted to all facilities in geographic area requested by pt/family on 12/29/15     FL2 transmitted to all facilities within larger geographic area on       Patient informed that his/her managed care company has contracts with or will negotiate with certain facilities, including the following:            Patient/family informed of bed offers received.  Patient chooses bed at       Physician recommends and patient chooses bed at      Patient to be transferred to   on  .  Patient to be transferred to facility by       Patient family notified on   of transfer.  Name of family member notified:        PHYSICIAN       Additional Comment:    _______________________________________________ Standley Brooking, LCSW 12/29/2015, 11:31 AM

## 2015-12-29 NOTE — Progress Notes (Signed)
Pt's scheduled multivitamin crushed and mixed in with applesauce. Pt eats half of the applesauce and refuses the rest, turning to her side and closing her eyes. Applesauce disposed of.

## 2015-12-30 DIAGNOSIS — F1010 Alcohol abuse, uncomplicated: Secondary | ICD-10-CM

## 2015-12-30 DIAGNOSIS — R531 Weakness: Secondary | ICD-10-CM

## 2015-12-30 LAB — BASIC METABOLIC PANEL
Anion gap: 7 (ref 5–15)
BUN: 29 mg/dL — ABNORMAL HIGH (ref 6–20)
CHLORIDE: 115 mmol/L — AB (ref 101–111)
CO2: 23 mmol/L (ref 22–32)
Calcium: 11.6 mg/dL — ABNORMAL HIGH (ref 8.9–10.3)
Creatinine, Ser: 2.21 mg/dL — ABNORMAL HIGH (ref 0.44–1.00)
GFR calc non Af Amer: 21 mL/min — ABNORMAL LOW (ref >60)
GFR, EST AFRICAN AMERICAN: 24 mL/min — AB (ref >60)
Glucose, Bld: 121 mg/dL — ABNORMAL HIGH (ref 65–99)
POTASSIUM: 3 mmol/L — AB (ref 3.5–5.1)
Sodium: 145 mmol/L (ref 135–145)

## 2015-12-30 LAB — CBC
HEMATOCRIT: 30.2 % — AB (ref 36.0–46.0)
Hemoglobin: 9.9 g/dL — ABNORMAL LOW (ref 12.0–15.0)
MCH: 26.7 pg (ref 26.0–34.0)
MCHC: 32.8 g/dL (ref 30.0–36.0)
MCV: 81.4 fL (ref 78.0–100.0)
PLATELETS: 454 10*3/uL — AB (ref 150–400)
RBC: 3.71 MIL/uL — AB (ref 3.87–5.11)
RDW: 14.5 % (ref 11.5–15.5)
WBC: 7.1 10*3/uL (ref 4.0–10.5)

## 2015-12-30 MED ORDER — SODIUM CHLORIDE 0.9 % IV SOLN: 30.0000 meq | Freq: Once | INTRAVENOUS | Status: DC

## 2015-12-30 MED ORDER — POTASSIUM CHLORIDE 10 MEQ/100ML IV SOLN
10.0000 meq | INTRAVENOUS | Status: AC
  Administered 2015-12-30 (×3): 10 meq via INTRAVENOUS
  Filled 2015-12-30 (×3): qty 100

## 2015-12-30 NOTE — Progress Notes (Signed)
Physical Therapy Treatment Patient Details Name: Ashley West MRN: [id number] DOB: November 22, 1940 Today's Date: 12/30/2015    History of Present Illness 75 yo female admitted with UTI. Hx of stage IV lymphoma with mets to spine and cord compression, ETOH abuse.     PT Comments    The treatment is limited to bed mobility only. The patient did not want to attempt bedside sitting stated that she would just fall off of the bed. The patient reports decreased sensation of the legs and trunk, noted to have some movement of the legs when  repositions self using rails. Disposition uncertain. Patient is near to being bedbound. Continue PT efforts as patient able.  Follow Up Recommendations  SNF;Supervision/Assistance - 24 hour     Equipment Recommendations  Hospital bed (if DC to home)    Recommendations for Other Services       Precautions / Restrictions Precautions Precautions: Fall Precaution Comments: incontinence    Mobility  Bed Mobility Overal bed mobility: Needs Assistance Bed Mobility: Rolling Rolling: Mod assist         General bed mobility comments: patient uses rail to self assis with rolling to right and left. , noted  that the patient is abkle to flex the legs  in prep to roll, she flexes the hips and knees while in supine.   Transfers                 General transfer comment: patient declined t to sit at bed edge  Ambulation/Gait                 Stairs            Wheelchair Mobility    Modified Rankin (Stroke Patients Only)       Balance                                    Cognition Arousal/Alertness: Awake/alert Behavior During Therapy: Anxious;Restless                        Exercises      General Comments        Pertinent Vitals/Pain Pain Assessment: Faces Faces Pain Scale: Hurts little more Pain Location: back Pain Descriptors / Indicators: Aching;Discomfort Pain Intervention(s): Premedicated  before session;Repositioned    Home Living                      Prior Function            PT Goals (current goals can now be found in the care plan section) Progress towards PT goals: Progressing toward goals    Frequency    Min 2X/week      PT Plan Current plan remains appropriate    Co-evaluation             End of Session   Activity Tolerance: Patient limited by fatigue Patient left: in bed;with call bell/phone within reach;with bed alarm set     Time: 1415-1429 PT Time Calculation (min) (ACUTE ONLY): 14 min  Charges:  $Therapeutic Activity: 8-22 mins                    G Codes:      Marcelino Freestone PT [id number]  12/30/2015, 2:58 PM

## 2015-12-30 NOTE — Progress Notes (Addendum)
Patient ID: Ashley West, female   DOB: Oct 14, 1940, 75 y.o.   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 Patient declined systemic chemotherapy for cancer and underwent radiation for her thoracic metastatic cancer and cord compression. She was placed on hospice care 3 years ago but was discharged from their service.   She follows up with her primary care doctor now but does not see an oncologist anymore.   She was found to be dehydrated on admission and with urinary tract infection, hypercalcemia with calcium 15.0. Chest x-ray showed bilateral pleural effusion and possible atelectasis.  She was started on IV fluids, calcitonin, and pamidronate (given 12/28/2015).     Assessment & Plan:  UTI (urinary tract infection) - Blood cultures so far show no growth - Urine culture so far with multiple species, non-predominant - Stop Rocephin today   Hypercalcemia related to malignancy - Calcium 11.6 today - Continue to monitor, hopefully will normalize in next day or so - PTH is 18, within normal limits although on lower end of normal range - She has received pamidronate, Calcitonin, IV fluids  Hypokalemia - Continue to supplement   Difficulty swallowing - Per SLP - dysphagia 2 diet   Essential hypertension - Discontinued losartan, HCTZ due to worsening renal function and hypercalcemia - Continue amlodipine  Diffuse large B cell lymphoma - Refused systemic chemotherapy. S/p of radiation therapy due to thoracic metastatic cancer  with cord compression. hassequela of bilateral leg weakness, using wheelchair.  Suspect the cancer has progressed resulting in hypercalcemia malignancy. Prognosis is poor. Recommended she resumes hospice care.  AoCKD-III - Baseline creatinine 1.0 - On admission, Creatinine is 3.14, likely deu to dehydration, diuretics, hypercalcemia, hyzaar  - Cr improving   Anemia of chronic disease - Due to CKD and lymphoma - Hgb stable   Generalized weakness - Likely due to combination of UTI, hypercalcemia, dehydration, worsening renal function, and malignancy - Will see if pt qualifies for SNF  - Appreciate SW assistance with SNF opportunities   Alcohol abuse: - No reports of withdrawals - Continue CIWA protocol  - No reports of withdrawals so far    DVT prophylaxis:  Heparin Code Status:  DNR/DNI Family Communication:  Family not at the bedside this am, called pt spouse, gave update on SNF and possible d/c in am if pt feels better  Disposition Plan:  Possibly in am if calcium closer to normal; will see with SW if pt qualifies for SNF   Consultants:   SLP  Procedures:   None  Antimicrobials:   Rocephin, stop today    Subjective: No overnight events.  Objective: Vitals:   12/29/15 0636 12/29/15 1447 12/29/15 2004 12/30/15 0457  BP: (!) 146/64 (!) 147/59 (!) 147/54 (!) 163/62  Pulse: 76 79 85 76  Resp: 18 18 17 17   Temp: 99.2 F (37.3 C) 98.7 F (37.1 C) 98.5 F (36.9 C) 98.1 F (36.7 C)  TempSrc: Oral Oral Oral Oral  SpO2: 98% 99% 96% 100%  Weight:      Height:        Intake/Output Summary (Last 24 hours) at 12/30/15  1108 Last data filed at 12/30/15 0609  Gross per 24 hour  Intake          3463.75 ml  Output              550 ml  Net          2913.75 ml   Filed Weights   12/27/15 1550 12/27/15 2159  Weight: 58.1 kg (128 lb) 56.5 kg (124 lb 9 oz)    Examination:  General exam: Appears calm and comfortable, no distress  Respiratory system: Clear to  auscultation. No wheezing  Cardiovascular system: S1 & S2 heard, Rate controlled. No pedal edema. Gastrointestinal system: (+) BS, non tender abdomen  Central nervous system: No focal neurological deficits. Extremities: No edema, palpable pulses  Skin: Skin is warm and dry  Psychiatry: Normal mood and affect   Data Reviewed: I have personally reviewed following labs and imaging studies  CBC:  Recent Labs Lab 12/27/15 1653 12/28/15 0520 12/29/15 0557 12/30/15 0530  WBC 8.9 7.3 6.7 7.1  NEUTROABS 6.6  --   --   --   HGB 11.2* 10.1* 9.6* 9.9*  HCT 32.9* 30.6* 29.2* 30.2*  MCV 79.9 81.4 81.6 81.4  PLT 489* 451* 443* [id number]*   Basic Metabolic Panel:  Recent Labs Lab 12/27/15 1653 12/28/15 0520 12/29/15 0557 12/30/15 0530  NA 135 137 142 145  K 3.4* 3.5 3.3* 3.0*  CL 94* 104 111 115*  CO2 28 25 22 23   GLUCOSE 83 82 111* 121*  BUN 48* 47* 36* 29*  CREATININE 3.14* 2.83* 2.24* 2.21*  CALCIUM >15.0* 14.4* 12.4* 11.6*   GFR: Estimated Creatinine Clearance: 17.6 mL/min (by C-G formula based on SCr of 2.21 mg/dL (H)). Liver Function Tests:  Recent Labs Lab 12/27/15 1653  AST 23  ALT 20  ALKPHOS 91  BILITOT 0.9  PROT 6.3*  ALBUMIN 3.2*   No results for input(s): LIPASE, AMYLASE in the last 168 hours. No results for input(s): AMMONIA in the last 168 hours. Coagulation Profile: No results for input(s): INR, PROTIME in the last 168 hours. Cardiac Enzymes: No results for input(s): CKTOTAL, CKMB, CKMBINDEX, TROPONINI in the last 168 hours. BNP (last 3 results) No results for input(s): PROBNP in the last 8760 hours. HbA1C: No results for input(s): HGBA1C in the last 72 hours. CBG:  Recent Labs Lab 12/29/15 0744  GLUCAP 93   Lipid Profile: No results for input(s): CHOL, HDL, LDLCALC, TRIG, CHOLHDL, LDLDIRECT in the last 72 hours. Thyroid Function Tests: No results for input(s): TSH, T4TOTAL, FREET4, T3FREE, THYROIDAB in the last 72 hours. Anemia Panel: No  results for input(s): VITAMINB12, FOLATE, FERRITIN, TIBC, IRON, RETICCTPCT in the last 72 hours. Urine analysis:    Component Value Date/Time   COLORURINE YELLOW 12/27/2015 1800   APPEARANCEUR HAZY (A) 12/27/2015 1800   LABSPEC 1.011 12/27/2015 1800   PHURINE 6.0 12/27/2015 1800   GLUCOSEU NEGATIVE 12/27/2015 1800   HGBUR NEGATIVE 12/27/2015 1800   BILIRUBINUR NEGATIVE 12/27/2015 1800   KETONESUR 5 (A) 12/27/2015 1800   PROTEINUR 30 (A) 12/27/2015 1800   UROBILINOGEN 0.2 04/15/2013 1338   NITRITE NEGATIVE 12/27/2015 1800   LEUKOCYTESUR LARGE (A) 12/27/2015 1800   Sepsis Labs: @LABRCNTIP (procalcitonin:4,lacticidven:4)  Recent Results (from the past 240 hour(s))  Urine culture     Status: Abnormal   Collection Time: 12/27/15  6:00 PM  Result Value Ref Range Status   Specimen Description URINE, RANDOM  Final   Special Requests NONE  Final  Culture MULTIPLE SPECIES PRESENT, SUGGEST RECOLLECTION (A)  Final   Report Status 12/29/2015 FINAL  Final  Culture, blood (Routine X 2) w Reflex to ID Panel     Status: None (Preliminary result)   Collection Time: 12/27/15 10:29 PM  Result Value Ref Range Status   Specimen Description BLOOD LEFT ARM  Final   Special Requests IN PEDIATRIC BOTTLE 4CC  Final   Culture   Final    NO GROWTH 1 DAY Performed at [facility]    Report Status PENDING  Incomplete  Culture, blood (Routine X 2) w Reflex to ID Panel     Status: None (Preliminary result)   Collection Time: 12/27/15 10:29 PM  Result Value Ref Range Status   Specimen Description BLOOD LEFT HAND  Final   Special Requests BOTTLES DRAWN AEROBIC ONLY 5CC  Final   Culture   Final    NO GROWTH 1 DAY Performed at [facility]    Report Status PENDING  Incomplete      Radiology Studies: Dg Chest 2 View Result Date: 12/27/2015 Moderate partially loculated right pleural effusion with atelectasis or infiltrate in right lower lobe. No pulmonary edema. Electronically Signed    By: Lahoma Crocker M.D.   On: 12/27/2015 17:06     Scheduled Meds: . amLODipine  10 mg Oral Daily  . calcitonin  4 Units/kg Intramuscular BID  . cefTRIAXone (ROCEPHIN)  IV  1 g Intravenous Q24H  . folic acid  1 mg Oral Daily  . heparin  5,000 Units Subcutaneous Q8H  . LORazepam  0-4 mg Intravenous Q6H   Followed by  .  LORazepam  0-4 mg Intravenous Q12H  . multivitamin with   1 tablet Oral Daily  . thiamine  100 mg Oral Daily   Or  . thiamine  100 mg Intravenous Daily   Continuous Infusions: . sodium chloride 125 mL/hr at 12/30/15 0609     LOS: 3 days    Time spent: 25 minutes  Greater than 50% of the time spent on counseling and coordinating the care.   Leisa Lenz, MD [facility] Pager [phone]  If 7PM-7AM, please contact night-coverage [email or web address] Password [email or web address] 12/30/2015, 11:08 AM

## 2015-12-30 NOTE — Progress Notes (Signed)
OT Cancellation Note  Patient Details Name: Ashley West MRN: [id number] DOB: 1940/02/27   Cancelled Treatment:    Reason Eval/Treat Not Completed: Other (comment) (pt declined. Will defer OT to SNF)  [facility], OTR/L  [phone] 12/30/2015 12/30/2015, 3:41 PM

## 2015-12-31 LAB — BASIC METABOLIC PANEL
Anion gap: 8 (ref 5–15)
BUN: 25 mg/dL — ABNORMAL HIGH (ref 6–20)
CALCIUM: 10.6 mg/dL — AB (ref 8.9–10.3)
CHLORIDE: 115 mmol/L — AB (ref 101–111)
CO2: 21 mmol/L — ABNORMAL LOW (ref 22–32)
CREATININE: 1.93 mg/dL — AB (ref 0.44–1.00)
GFR, EST AFRICAN AMERICAN: 28 mL/min — AB (ref >60)
GFR, EST NON AFRICAN AMERICAN: 24 mL/min — AB (ref >60)
Glucose, Bld: 114 mg/dL — ABNORMAL HIGH (ref 65–99)
Potassium: 3 mmol/L — ABNORMAL LOW (ref 3.5–5.1)
SODIUM: 144 mmol/L (ref 135–145)

## 2015-12-31 LAB — CBC
HCT: 28.7 % — ABNORMAL LOW (ref 36.0–46.0)
Hemoglobin: 9.3 g/dL — ABNORMAL LOW (ref 12.0–15.0)
MCH: 25.8 pg — ABNORMAL LOW (ref 26.0–34.0)
MCHC: 32.4 g/dL (ref 30.0–36.0)
MCV: 79.5 fL (ref 78.0–100.0)
PLATELETS: 403 10*3/uL — AB (ref 150–400)
RBC: 3.61 MIL/uL — AB (ref 3.87–5.11)
RDW: 14.4 % (ref 11.5–15.5)
WBC: 9.1 10*3/uL (ref 4.0–10.5)

## 2015-12-31 MED ORDER — POTASSIUM CHLORIDE CRYS ER 20 MEQ PO TBCR
20.0000 meq | EXTENDED_RELEASE_TABLET | Freq: Once | ORAL | Status: AC
  Administered 2015-12-31: 20 meq via ORAL
  Filled 2015-12-31: qty 1

## 2015-12-31 MED ORDER — POTASSIUM CHLORIDE 10 MEQ/100ML IV SOLN
10.0000 meq | INTRAVENOUS | Status: AC
  Administered 2015-12-31: 10 meq via INTRAVENOUS
  Filled 2015-12-31 (×3): qty 100

## 2015-12-31 NOTE — Care Management Important Message (Signed)
Important Message  Patient Details  Name: Ashley West MRN: [id number] Date of Birth: November 27, 1940   Medicare Important Message Given:  Yes    Kerin Salen 12/31/2015, 12:02 [place] Message  Patient Details  Name: Ashley West MRN: [id number] Date of Birth: 1940-06-15   Medicare Important Message Given:  Yes    Kerin Salen 12/31/2015, 12:02 PM

## 2015-12-31 NOTE — Progress Notes (Signed)
Patient ID: Ashley West, female   DOB: 1940/07/11, 75 y.o.   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 Patient declined systemic chemotherapy for cancer and underwent radiation for her thoracic metastatic cancer and cord compression. She was placed on hospice care 3 years ago but was discharged from their service.   She follows up with her primary care doctor now but does not see an oncologist anymore.   She was found to be dehydrated on admission and with urinary tract infection, hypercalcemia with calcium 15.0. Chest x-ray showed bilateral pleural effusion and possible atelectasis.  She was started on IV fluids, calcitonin, and pamidronate (given 12/28/2015).     Assessment & Plan:  UTI (urinary tract infection) - Blood cultures so far show no growth - Urine culture so far with multiple species, non-predominant - Rocephin last dose 12/19  Hypercalcemia related to malignancy - Calcium 10.6, improving  - PTH is 18, within normal limits although on lower end of normal range - She has received pamidronate, Calcitonin, IV fluids  Hypokalemia - Continue to supplement   Difficulty swallowing - Per SLP - dysphagia 2 diet   Essential hypertension - Discontinued losartan, HCTZ due to worsening renal function and hypercalcemia - Continue amlodipine  Diffuse large B cell lymphoma - Refused systemic chemotherapy. S/p of radiation therapy due to thoracic metastatic cancer with cord compression. hassequela of bilateral leg  weakness, using wheelchair.  Suspect the cancer has progressed resulting in hypercalcemia malignancy. Prognosis is poor. Recommended she resumes hospice care.  AoCKD-III - Baseline creatinine 1.0 - On admission, Creatinine is 3.14, likely deu to dehydration, diuretics, hypercalcemia, hyzaar  - Cr continues to improve   Anemia of chronic disease - Due to CKD and lymphoma - Hgb stable at 9.3  Generalized weakness - Likely due to combination of UTI, hypercalcemia, dehydration, worsening renal function, and malignancy - To SNF once calcium WNL   Alcohol abuse: - No reports of withdrawals - No reports of withdrawals so far    DVT prophylaxis:  Heparin Code Status:  DNR/DNI Family Communication:  Spouse at bedside  Disposition Plan:  Possibly in am if calcium WNL   Consultants:   SLP  Procedures:   None  Antimicrobials:   Rocephin   Subjective: No overnight events.  Objective: Vitals:   12/30/15 0457 12/30/15 1528 12/30/15 2004 12/31/15 0413  BP: (!) 163/62 (!) 148/57 (!) 156/62 (!) 153/71  Pulse: 76 82 85 70  Resp: 17 18 17 16   Temp: 98.1 F (36.7 C) 99.3 F (37.4 C) 98.6 F (37 C) 98.2 F (36.8 C)  TempSrc: Oral Oral Oral Oral  SpO2: 100% 98% 98% 100%  Weight:      Height:        Intake/Output Summary (Last 24 hours) at 12/31/15 1510 Last data filed at 12/31/15 0413  Gross per 24 hour  Intake             1150 ml  Output              202 ml  Net  948 ml   Filed Weights   12/27/15 1550 12/27/15 2159  Weight: 58.1 kg (128 lb) 56.5 kg (124 lb 9 oz)    Examination:  General exam: no distress  Respiratory system: No wheezing, no rhonchi  Cardiovascular system: S1 & S2 heard, RRR Gastrointestinal system: (+) BS, non tender abdomen  Central nervous system: Nofocal  Extremities: No edema, palpable pulses  Skin: No lesions, no ulcers  Psychiatry: Normal mood and affect   Data Reviewed: I have personally reviewed following labs  and imaging studies  CBC:  Recent Labs Lab 12/27/15 1653 12/28/15 0520 12/29/15 0557 12/30/15 0530 12/31/15 0555  WBC 8.9 7.3 6.7 7.1 9.1  NEUTROABS 6.6  --   --   --   --   HGB 11.2* 10.1* 9.6* 9.9* 9.3*  HCT 32.9* 30.6* 29.2* 30.2* 28.7*  MCV 79.9 81.4 81.6 81.4 79.5  PLT 489* 451* 443* 454* [id number]*   Basic Metabolic Panel:  Recent Labs Lab 12/27/15 1653 12/28/15 0520 12/29/15 0557 12/30/15 0530 12/31/15 0555  NA 135 137 142 145 144  K 3.4* 3.5 3.3* 3.0* 3.0*  CL 94* 104 111 115* 115*  CO2 28 25 22 23  21*  GLUCOSE 83 82 111* 121* 114*  BUN 48* 47* 36* 29* 25*  CREATININE 3.14* 2.83* 2.24* 2.21* 1.93*  CALCIUM >15.0* 14.4* 12.4* 11.6* 10.6*   GFR: Estimated Creatinine Clearance: 20.1 mL/min (by C-G formula based on SCr of 1.93 mg/dL (H)). Liver Function Tests:  Recent Labs Lab 12/27/15 1653  AST 23  ALT 20  ALKPHOS 91  BILITOT 0.9  PROT 6.3*  ALBUMIN 3.2*   No results for input(s): LIPASE, AMYLASE in the last 168 hours. No results for input(s): AMMONIA in the last 168 hours. Coagulation Profile: No results for input(s): INR, PROTIME in the last 168 hours. Cardiac Enzymes: No results for input(s): CKTOTAL, CKMB, CKMBINDEX, TROPONINI in the last 168 hours. BNP (last 3 results) No results for input(s): PROBNP in the last 8760 hours. HbA1C: No results for input(s): HGBA1C in the last 72 hours. CBG:  Recent Labs Lab 12/29/15 0744  GLUCAP 93   Lipid Profile: No results for input(s): CHOL, HDL, LDLCALC, TRIG, CHOLHDL, LDLDIRECT in the last 72 hours. Thyroid Function Tests: No results for input(s): TSH, T4TOTAL, FREET4, T3FREE, THYROIDAB in the last 72 hours. Anemia Panel: No results for input(s): VITAMINB12, FOLATE, FERRITIN, TIBC, IRON, RETICCTPCT in the last 72 hours. Urine analysis:    Component Value Date/Time   COLORURINE YELLOW 12/27/2015 1800   APPEARANCEUR HAZY (A) 12/27/2015 1800   LABSPEC 1.011 12/27/2015 1800   PHURINE 6.0 12/27/2015  1800   GLUCOSEU NEGATIVE 12/27/2015 1800   HGBUR NEGATIVE 12/27/2015 1800   BILIRUBINUR NEGATIVE 12/27/2015 1800   KETONESUR 5 (A) 12/27/2015 1800   PROTEINUR 30 (A) 12/27/2015 1800   UROBILINOGEN 0.2 04/15/2013 1338   NITRITE NEGATIVE 12/27/2015 1800   LEUKOCYTESUR LARGE (A) 12/27/2015 1800   Sepsis Labs: @LABRCNTIP (procalcitonin:4,lacticidven:4)  Recent Results (from the past 240 hour(s))  Urine culture     Status: Abnormal   Collection Time: 12/27/15  6:00 PM  Result Value Ref Range Status   Specimen Description URINE, RANDOM  Final   Special Requests NONE  Final   Culture MULTIPLE SPECIES PRESENT, SUGGEST RECOLLECTION (A)  Final   Report Status 12/29/2015 FINAL  Final  Culture, blood (Routine X 2) w Reflex to ID Panel     Status: None (Preliminary result)   Collection Time: 12/27/15 10:29 PM  Result  Value Ref Range Status   Specimen Description BLOOD LEFT ARM  Final   Special Requests IN PEDIATRIC BOTTLE 4CC  Final   Culture   Final    NO GROWTH 3 DAYS Performed at [facility]    Report Status PENDING  Incomplete  Culture, blood (Routine X 2) w Reflex to ID Panel     Status: None (Preliminary result)   Collection Time: 12/27/15 10:29 PM  Result Value Ref Range Status   Specimen Description BLOOD LEFT HAND  Final   Special Requests BOTTLES DRAWN AEROBIC ONLY 5CC  Final   Culture   Final    NO GROWTH 3 DAYS Performed at [facility]    Report Status PENDING  Incomplete      Radiology Studies: Dg Chest 2 View Result Date: 12/27/2015 Moderate partially loculated right pleural effusion with atelectasis or infiltrate in right lower lobe. No pulmonary edema. Electronically Signed   By: Lahoma Crocker M.D.   On: 12/27/2015 17:06     Scheduled Meds: . amLODipine  10 mg Oral Daily  . calcitonin  4 Units/kg Intramuscular BID  . cefTRIAXone (ROCEPHIN)  IV  1 g Intravenous Q24H  . folic acid  1 mg Oral Daily  . heparin  5,000 Units Subcutaneous Q8H  .  LORazepam  0-4 mg Intravenous Q6H   Followed by  .  LORazepam  0-4 mg Intravenous Q12H  . multivitamin with   1 tablet Oral Daily  . thiamine  100 mg Oral Daily   Or  . thiamine  100 mg Intravenous Daily   Continuous Infusions: . sodium chloride 75 mL/hr at 12/30/15 1631     LOS: 4 days    Time spent: 15 minutes  Greater than 50% of the time spent on counseling and coordinating the care.   Leisa Lenz, MD [facility] Pager [phone]  If 7PM-7AM, please contact night-coverage [email or web address] Password [email or web address] 12/31/2015, 3:10 PM

## 2015-12-31 NOTE — Clinical Social Work Placement (Signed)
Patient has a bed at [facility]. CSW has completed FL2 & will continue to follow, anticipating possible discharge tomorrow per Dr. Charlies Silvers - patient & husband made aware.     Raynaldo Opitz, [facility] Clinical Social Worker cell #: [phone]     CLINICAL SOCIAL WORK PLACEMENT  NOTE  Date:  12/31/2015  Patient Details  Name: Ashley West MRN: [id number] Date of Birth: [birth year]  Clinical Social Work is seeking post-discharge placement for this patient at the [place] level of care (*CSW will initial, date and re-position this form in  chart as items are completed):  Yes   Patient/family provided with [place] Work Department's list of facilities offering this level of care within the geographic area requested by the patient (or if unable, by the patient's family).  Yes   Patient/family informed of their freedom to choose among providers that offer the needed level of care, that participate in Medicare, Medicaid or managed care program needed by the patient, have an available bed and are willing to accept the patient.  Yes   Patient/family informed of [place]'s ownership interest in [facility] and [facility], as well as of the fact that they are under no obligation to receive care at these facilities.  PASRR submitted to EDS on       PASRR number received on       Existing PASRR number confirmed on 12/29/15     FL2 transmitted to all facilities in geographic area requested by pt/family on 12/29/15     FL2 transmitted to all facilities within larger geographic area on       Patient informed that his/her managed care company has contracts with or will negotiate with certain facilities, including the following:        Yes   Patient/family informed of bed offers received.  Patient chooses bed at [facility]     Physician recommends and patient chooses bed at      Patient to be  transferred to [facility] on  .  Patient to be transferred to facility by       Patient family notified on   of transfer.  Name of family member notified:        PHYSICIAN       Additional Comment:    _______________________________________________ Standley Brooking, LCSW 12/31/2015, 2:05 PM

## 2016-01-01 LAB — BASIC METABOLIC PANEL
ANION GAP: 9 (ref 5–15)
BUN: 25 mg/dL — ABNORMAL HIGH (ref 6–20)
CO2: 19 mmol/L — ABNORMAL LOW (ref 22–32)
Calcium: 10.1 mg/dL (ref 8.9–10.3)
Chloride: 116 mmol/L — ABNORMAL HIGH (ref 101–111)
Creatinine, Ser: 1.82 mg/dL — ABNORMAL HIGH (ref 0.44–1.00)
GFR calc non Af Amer: 26 mL/min — ABNORMAL LOW (ref >60)
GFR, EST AFRICAN AMERICAN: 30 mL/min — AB (ref >60)
Glucose, Bld: 136 mg/dL — ABNORMAL HIGH (ref 65–99)
POTASSIUM: 2.8 mmol/L — AB (ref 3.5–5.1)
SODIUM: 144 mmol/L (ref 135–145)

## 2016-01-01 MED ORDER — AMLODIPINE BESYLATE 10 MG PO TABS: 10.0000 mg | ORAL_TABLET | Freq: Every day | ORAL | 0 refills | Status: AC

## 2016-01-01 MED ORDER — THIAMINE HCL 100 MG PO TABS: 100.0000 mg | ORAL_TABLET | Freq: Every day | ORAL | 0 refills | Status: AC

## 2016-01-01 MED ORDER — FOLIC ACID 1 MG PO TABS
1.0000 mg | ORAL_TABLET | Freq: Every day | ORAL | 0 refills | Status: AC
Start: 2016-01-01 — End: ?

## 2016-01-01 MED ORDER — BOOST / RESOURCE BREEZE PO LIQD: 1.0000 | ORAL | 0 refills | Status: AC

## 2016-01-01 MED ORDER — BOOST / RESOURCE BREEZE PO LIQD
1.0000 | ORAL | Status: DC
  Administered 2016-01-01: 1 via ORAL

## 2016-01-01 MED ORDER — ACETAMINOPHEN 325 MG PO TABS: 650.0000 mg | ORAL_TABLET | Freq: Four times a day (QID) | ORAL | 0 refills | Status: AC | PRN

## 2016-01-01 MED ORDER — POTASSIUM CHLORIDE ER 20 MEQ PO TBCR: 20.0000 meq | EXTENDED_RELEASE_TABLET | Freq: Every day | ORAL | 0 refills | Status: AC

## 2016-01-01 MED ORDER — PREMIER PROTEIN SHAKE: 11.0000 [oz_av] | ORAL | Status: DC

## 2016-01-01 MED ORDER — POTASSIUM CHLORIDE CRYS ER 20 MEQ PO TBCR
40.0000 meq | EXTENDED_RELEASE_TABLET | Freq: Once | ORAL | Status: AC
  Administered 2016-01-01: 40 meq via ORAL
  Filled 2016-01-01: qty 2

## 2016-01-01 NOTE — Progress Notes (Signed)
Nutrition Follow-up  DOCUMENTATION CODES:   Not applicable  INTERVENTION:  - Decrease Boost Breeze to once/day.  - If pt does not d/c by 12/26, RD will follow-up that date.   NUTRITION DIAGNOSIS:   Inadequate oral intake related to acute illness, poor appetite as evidenced by per patient/family report. -ongoing  GOAL:   Patient will meet greater than or equal to 90% of their needs -unmet  MONITOR:   PO intake, Supplement acceptance, Weight trends, Labs, I & O's  ASSESSMENT:   75 y.o. 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  She declined systemic chemotherapy for cancer and underwent radiation for her thoracic metastatic cancer and cord compression. She was found to be dehydrated with urinary tract infection hypercalcemia, calcium 15.0.  Chest x-ray showed bilateral pleural effusion and possible atelectasis.  The degree of her hypercalcemia suggests that she is having worsening bony involvement of her malignancy. Her prognosis is poor.  12/21 Per chart review, pt has been consuming 0% of all meals since previous assessment. She has mainly been refusing [employer], which is currently ordered TID; will decrease order to once/day. Per Dr. Fortino Sic note 12/20, possible d/c today if Ca WDL. Ca 10.1 mg/dL this AM. No new weight since admission.   Medications reviewed; 1 mg oral folic acid/day, daily multivitamin with minerals, PRN Zofran, 20 mEq oral KCl x1 dose yesterday, 100 mg thiamine/day.  Labs reviewed; K: 3 mmol/L, Cl: 115 mmol/L, BUN: 25 mg/dL, creatinine: 1.93 mg/dL, Ca: 10.6 mg/dL, GFR: 28 mL/min.   IVF: NS @ 75 mL/hr.     12/18 - Per chart review, she consumed 20% of  breakfast this AM.   - She states that she has been very tired this afternoon and that she did not have anything for lunch d/t this and lack of desire to consume anything.  - Notes from MDs and SLP reviewed in detail.  - SLP note indicates pt desires to have Boost Breeze supplement; will order as outlined above.  - RN brought in a [employer] at time of RD visit.  - Unable to perform physical assessment at this time.  - No weight in chart since 2015 which shows weight at this time is consistent with weight at that time (126 lbs on 07/06/13 and currently 124 lbs).  - Unable to state malnutrition based on available information at this time.   IVF: NS @ 125 mL/hr.     Diet Order:  DIET DYS 2 Room service appropriate? Yes; Fluid consistency: Thin  Skin:  Reviewed, no issues  Last BM:  12/20  Height:   Ht Readings from Last 1 Encounters:  12/27/15 5' (1.524 m)    Weight:   Wt Readings from Last 1 Encounters:  12/27/15 124 lb 9 oz (56.5 kg)    Ideal Body Weight:  45.45 kg  BMI:  Body mass index is 24.33 kg/m.  Estimated Nutritional Needs:   Kcal:  1415-1585 (25-28 kcal/kg)  Protein:  57-68 grams (1-1.2 grams/kg)  Fluid:  1.6-1.8 L/day  EDUCATION NEEDS:   No education needs identified at this time    Jarome Matin, MS, RD, LDN, CNSC Inpatient Clinical Dietitian Pager # [phone] After hours/weekend pager # [phone]

## 2016-01-01 NOTE — Discharge Summary (Signed)
Physician Discharge Summary  Ashley West [id number] DOB: 05/01/1940 DOA: 12/27/2015  PCP: [name], MD  Admit date: 12/27/2015 Discharge date: 01/01/2016  Recommendations for Outpatient Follow-up:  Continue to supplement potassium for 2 weeks and recheck the potassium level to make sure it is WNL Follow up calcium level in next 1 week to make sure it is WNL. Ca 10.1 today. If patient continues to decline please consider placement to residential hospice.  Discharge Diagnoses:  Principal Problem:   Hypercalcemia of malignancy Active Problems:   HTN (hypertension)   Alcohol abuse   Diffuse large B cell lymphoma (HCC)   Weakness of both lower extremities   Acute on chronic kidney failure-II   UTI (urinary tract infection)   Generalized weakness   Hypokalemia    Discharge Condition: stable; calcium WNL. Pt insisting on being discharged today. Her husband at the bedside says he is fine with discharge as well.   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 Patient declined systemic chemotherapy for cancer and underwent radiation for her thoracic metastatic cancer and cord compression. She was placed on hospice care 3 years ago but was discharged from their service.  She follows up with her primary care doctor now but does not see an oncologist anymore.  She was found to be dehydrated on admission and with urinary tract infection, hypercalcemia with calcium 15.0. Chest x-ray showed bilateral pleural effusion and possible atelectasis. She was started on IV fluids, calcitonin, and  pamidronate (given 12/28/2015).    Hospital Course:  Assessment & Plan:  UTI (urinary tract infection) - Blood cultures so far show no growth - Urine culture so far with multiple species, non-predominant - Rocephin last dose 12/19  Hypercalcemia related to malignancy - Calcium 10.1, improved - PTH is 18, within normal limits although on lower end of normal range - She has received pamidronate, Calcitonin, IV fluids  Hypokalemia - Continue to supplement   Difficulty swallowing - Per SLP - dysphagia 2 diet   Essential hypertension - Discontinued losartan, HCTZ due to worsening renal function and hypercalcemia - Continue amlodipine  Diffuse large B cell lymphoma - Refused systemic chemotherapy. S/p of radiation therapy due to thoracic metastatic cancer with cord compression. hassequela of bilateral leg weakness, using wheelchair. Suspect the cancer has progressed resulting in hypercalcemia malignancy. Prognosis is poor. Recommended she resumes hospice care.  AoCKD-III - Baseline creatinine 1.0 - On admission, Creatinine is 3.14, likely deu to dehydration, diuretics, hypercalcemia, hyzaar  - Cr continues to improve, 2.21 --> 1.82  Anemia of chronic disease - Due to CKD and lymphoma - Hgb stable   Generalized weakness - Likely due to combination of UTI, hypercalcemia, dehydration, worsening renal function, and malignancy  Alcohol abuse: - No reports of withdrawals - No reports of withdrawals so far    DVT prophylaxis:Heparin Code Status: DNR/DNI Family Communication:Spouse at bedside     Consultants:  SLP  Procedures:  None  Antimicrobials:   Rocephin   Signed:  Leisa Lenz, MD  [facility] 01/01/2016, 10:59 AM  Pager #: [phone]  Time spent in minutes: less than 30 minutes    Discharge Exam: Vitals:   12/31/15 2114 01/01/16 0528  BP: (!) 150/63 (!) 164/77  Pulse:  82 92  Resp: 18 18  Temp: 98.5 F  (36.9 C) 98.2 F (36.8 C)   Vitals:   12/31/15 0413 12/31/15 1539 12/31/15 2114 01/01/16 0528  BP: (!) 153/71 (!) 169/71 (!) 150/63 (!) 164/77  Pulse: 70 80 82 92  Resp: 16 18 18 18   Temp: 98.2 F (36.8 C) 98.5 F (36.9 C) 98.5 F (36.9 C) 98.2 F (36.8 C)  TempSrc: Oral Oral Oral Oral  SpO2: 100% 98% 99% 96%  Weight:      Height:        General: Pt is alert, follows commands appropriately, not in acute distress Cardiovascular: Regular rate and rhythm, S1/S2 +, no murmurs Respiratory: Clear to auscultation bilaterally, no wheezing, no crackles, no rhonchi Abdominal: Soft, non tender, non distended, bowel sounds +, no guarding Extremities: no edema, no cyanosis, pulses palpable bilaterally DP and PT Neuro: Grossly nonfocal  Discharge Instructions  Discharge Instructions    Call MD for:  persistant nausea and vomiting    Complete by:  As directed    Call MD for:  redness, tenderness, or signs of infection (pain, swelling, redness, odor or green/yellow discharge around incision site)    Complete by:  As directed    Call MD for:  severe uncontrolled pain    Complete by:  As directed    Diet - low sodium heart healthy    Complete by:  As directed    Discharge instructions    Complete by:  As directed    Continue to supplement potassium for 2 weeks and recheck the potassium level to make sure it is WNL Follow up calcium level in next 1 week to make sure it is WNL. Ca 10.1 today.   Increase activity slowly    Complete by:  As directed      Allergies as of 01/01/2016   No Known Allergies     Medication List    STOP taking these medications   losartan-hydrochlorothiazide 50-12.5 MG tablet Commonly known as:  HYZAAR     TAKE these medications   acetaminophen 325 MG tablet Commonly known as:  TYLENOL Take 2 tablets (650 mg total) by mouth every 6 (six) hours as needed for mild pain (or Fever >/= 101).   amLODipine 10 MG tablet Commonly known as:  NORVASC Take 1  tablet (10 mg total) by mouth daily.   feeding supplement Liqd Take 1 Container by mouth daily.   folic acid 1 MG tablet Commonly known as:  FOLVITE Take 1 tablet (1 mg total) by mouth daily.   multivitamin with minerals Tabs tablet Take 1 tablet by mouth daily.   Potassium Chloride ER 20 MEQ Tbcr Take 20 mEq by mouth daily. What changed:  medication strength  how much to take   thiamine 100 MG tablet Take 1 tablet (100 mg total) by mouth daily.   vitamin E 400 UNIT capsule Generic drug:  vitamin E Take 400 Units by mouth daily.      Follow-up Information    [name], MD. Schedule an appointment as soon as possible for a visit in 1 week(s).   Specialty:  Family Medicine Contact information: [address] [place] [address] [phone]            The results of significant diagnostics from this hospitalization (including imaging, microbiology, ancillary and laboratory) are listed below for reference.    Significant Diagnostic Studies: Dg Chest 2 View  Result Date: 12/27/2015 CLINICAL DATA:  Nausea,  cough, possible aspiration EXAM: CHEST  2 VIEW COMPARISON:  03/02/2013 FINDINGS: Cardiomediastinal silhouette is stable. There is moderate partially loculated right pleural effusion. Atelectasis or infiltrate noted in right lower lobe. No pulmonary edema. Left lung is clear. Mild degenerative changes thoracic spine. IMPRESSION: Moderate partially loculated right pleural effusion with atelectasis or infiltrate in right lower lobe. No pulmonary edema. Electronically Signed   By: Lahoma Crocker M.D.   On: 12/27/2015 17:06    Microbiology: Recent Results (from the past 240 hour(s))  Urine culture     Status: Abnormal   Collection Time: 12/27/15  6:00 PM  Result Value Ref Range Status   Specimen Description URINE, RANDOM  Final   Special Requests NONE  Final   Culture MULTIPLE SPECIES PRESENT, SUGGEST RECOLLECTION (A)  Final   Report Status  12/29/2015 FINAL  Final  Culture, blood (Routine X 2) w Reflex to ID Panel     Status: None (Preliminary result)   Collection Time: 12/27/15 10:29 PM  Result Value Ref Range Status   Specimen Description BLOOD LEFT ARM  Final   Special Requests IN PEDIATRIC BOTTLE 4CC  Final   Culture   Final    NO GROWTH 3 DAYS Performed at [facility]    Report Status PENDING  Incomplete  Culture, blood (Routine X 2) w Reflex to ID Panel     Status: None (Preliminary result)   Collection Time: 12/27/15 10:29 PM  Result Value Ref Range Status   Specimen Description BLOOD LEFT HAND  Final   Special Requests BOTTLES DRAWN AEROBIC ONLY 5CC  Final   Culture   Final    NO GROWTH 3 DAYS Performed at [facility]    Report Status PENDING  Incomplete     Labs: Basic Metabolic Panel:  Recent Labs Lab 12/28/15 0520 12/29/15 0557 12/30/15 0530 12/31/15 0555 01/01/16 0852  NA 137 142 145 144 144  K 3.5 3.3* 3.0* 3.0* 2.8*  CL 104 111 115* 115* 116*  CO2 25 22 23  21* 19*  GLUCOSE 82 111* 121* 114* 136*  BUN 47* 36* 29* 25* 25*  CREATININE 2.83* 2.24* 2.21* 1.93* 1.82*  CALCIUM 14.4* 12.4* 11.6* 10.6* 10.1   Liver Function Tests:  Recent Labs Lab 12/27/15 1653  AST 23  ALT 20  ALKPHOS 91  BILITOT 0.9  PROT 6.3*  ALBUMIN 3.2*   No results for input(s): LIPASE, AMYLASE in the last 168 hours. No results for input(s): AMMONIA in the last 168 hours. CBC:  Recent Labs Lab 12/27/15 1653 12/28/15 0520 12/29/15 0557 12/30/15 0530 12/31/15 0555  WBC 8.9 7.3 6.7 7.1 9.1  NEUTROABS 6.6  --   --   --   --   HGB 11.2* 10.1* 9.6* 9.9* 9.3*  HCT 32.9* 30.6* 29.2* 30.2* 28.7*  MCV 79.9 81.4 81.6 81.4 79.5  PLT 489* 451* 443* 454* 403*   Cardiac Enzymes: No results for input(s): CKTOTAL, CKMB, CKMBINDEX, TROPONINI in the last 168 hours. BNP: BNP (last 3 results) No results for input(s): BNP in the last 8760 hours.  ProBNP (last 3 results) No results for input(s): PROBNP  in the last 8760 hours.  CBG:  Recent Labs Lab 12/29/15 0744  GLUCAP 93

## 2016-01-01 NOTE — Discharge Instructions (Signed)
Hypercalcemia Introduction Hypercalcemia is having too much calcium in the blood. The body needs calcium to make bones and keep them strong. Calcium also helps the muscles, nerves, brain, and heart work the way they should. Most of the calcium in the body is in the bones. There is also some calcium in the blood. Hypercalcemia can happen when calcium comes out of the bones, or when the kidneys are not able to remove calcium from the blood. Hypercalcemia can be mild or severe. What are the causes? There are many possible causes of hypercalcemia. Common causes include:  Hyperparathyroidism. This is a condition in which the body produces too much parathyroid hormone. There are four parathyroid glands in your neck. These glands produce a chemical messenger (hormone) that helps the body absorb calcium from foods and helps your bones release calcium.  Certain kinds of cancer, such as lung cancer, breast cancer, or myeloma. Less common causes of hypercalcemia include:  Getting too much calcium or vitamin D from your diet.  Kidney failure.  Hyperthyroidism.  Being on bed rest for a long time.  Certain medicines.  Infections.  Sarcoidosis. What increases the risk? This condition is more likely to develop in:  Women.  People who are 60 years or older.  People who have a family history of hypercalcemia. What are the signs or symptoms? Mild hypercalcemia that starts slowly may not cause symptoms. Severe, sudden hypercalcemia is more likely to cause symptoms, such as:  Loss of appetite.  Increased thirst and frequent urination.  Fatigue.  Nausea and vomiting.  Headache.  Abdominal pain.  Muscle pain, twitching, or weakness.  Constipation.  Blood in the urine.  Pain in the side of the back (flank pain).  Anxiety, confusion, or depression.  Irregular heartbeat (arrhythmia).  Loss of consciousness. How is this diagnosed? This condition may be diagnosed based on:  Your  symptoms.  Blood tests.  Urine tests.  X-rays.  Ultrasound.  MRI.  CT scan. How is this treated? Treatment for hypercalcemia depends on the cause. Treatment may include:  Receiving fluids through an IV tube.  Medicines that keep calcium levels steady after receiving fluids (loop diuretics).  Medicines that keep calcium in your bones (bisphosphonates).  Medicines that lower the calcium level in your blood.  Surgery to remove overactive parathyroid glands. Follow these instructions at home:  Take over-the-counter and prescription medicines only as told by your health care provider.  Follow instructions from your health care provider about eating or drinking restrictions.  Drink enough fluid to keep your urine clear or pale yellow.  Stay active. Weight-bearing exercise helps to keep calcium in your bones. Follow instructions from your health care provider about what type and level of exercise is safe for you.  Keep all follow-up visits as told by your health care provider. This is important. Contact a health care provider if:  You have a fever.  You have flank or abdominal pain that is getting worse. Get help right away if:  You have severe abdominal or flank pain.  You have chest pain.  You have trouble breathing.  You become very confused and sleepy.  You lose consciousness. This information is not intended to replace advice given to you by your health care provider. Make sure you discuss any questions you have with your health care provider. Document Released: 03/13/2004 Document Revised: 06/05/2015 Document Reviewed: 05/15/2014  2017 Elsevier

## 2016-01-01 NOTE — Clinical Social Work Placement (Signed)
Patient is set to discharge to [facility] today. Patient & patient's husband at bedside made aware. Discharge packet given to RN, Gregary Signs. [facility] will be called for transport once [facility] authorization has been obtained.     Raynaldo Opitz, [facility] Clinical Social Worker cell #: [phone]    CLINICAL SOCIAL WORK PLACEMENT  NOTE  Date:  01/01/2016  Patient Details  Name: Ashley West MRN: [id number] Date of Birth: [birth year]  Clinical Social Work is seeking post-discharge placement for this patient at the [place] level of care (*CSW will initial, date and re-position this form in  chart as items are completed):  Yes   Patient/family provided with [place] Work Department's list of facilities offering this level of care within the geographic area requested by the patient (or if unable, by the patient's family).  Yes   Patient/family informed of their freedom to choose among providers that offer the needed level of care, that participate in Medicare, Medicaid or managed care program needed by the patient, have an available bed and are willing to accept the patient.  Yes   Patient/family informed of [facility] ownership interest in [facility] and [facility], as well as of the fact that they are under no obligation to receive care at these facilities.  PASRR submitted to EDS on       PASRR number received on       Existing PASRR number confirmed on 12/29/15     FL2 transmitted to all facilities in geographic area requested by pt/family on 12/29/15     FL2 transmitted to all facilities within larger geographic area on       Patient informed that his/her managed care company has contracts with or will negotiate with certain facilities, including the following:        Yes   Patient/family informed of bed offers received.  Patient chooses bed at [facility]     Physician  recommends and patient chooses bed at      Patient to be transferred to [facility] on 01/01/16.  Patient to be transferred to facility by [facility]     Patient family notified on 01/01/16 of transfer.  Name of family member notified:  patient's husband at bedside     PHYSICIAN       Additional Comment:    _______________________________________________ Standley Brooking, LCSW 01/01/2016, 12:47 PM

## 2016-01-01 NOTE — Progress Notes (Signed)
Insurance authorization obtained. [facility] called for transport.    Raynaldo Opitz, [facility] Clinical Social Worker cell #: [phone]

## 2016-01-02 DIAGNOSIS — C8590 Non-Hodgkin lymphoma, unspecified, unspecified site: Secondary | ICD-10-CM | POA: Diagnosis not present

## 2016-01-02 DIAGNOSIS — I10 Essential (primary) hypertension: Secondary | ICD-10-CM | POA: Diagnosis not present

## 2016-01-02 DIAGNOSIS — N189 Chronic kidney disease, unspecified: Secondary | ICD-10-CM | POA: Diagnosis not present

## 2016-01-02 DIAGNOSIS — G9340 Encephalopathy, unspecified: Secondary | ICD-10-CM | POA: Diagnosis not present

## 2016-01-02 DIAGNOSIS — E46 Unspecified protein-calorie malnutrition: Secondary | ICD-10-CM | POA: Diagnosis not present

## 2016-01-02 DIAGNOSIS — R531 Weakness: Secondary | ICD-10-CM | POA: Diagnosis not present

## 2016-01-02 DIAGNOSIS — C8510 Unspecified B-cell lymphoma, unspecified site: Secondary | ICD-10-CM | POA: Diagnosis not present

## 2016-01-02 DIAGNOSIS — N390 Urinary tract infection, site not specified: Secondary | ICD-10-CM | POA: Diagnosis not present

## 2016-01-02 DIAGNOSIS — C7951 Secondary malignant neoplasm of bone: Secondary | ICD-10-CM | POA: Diagnosis not present

## 2016-01-02 DIAGNOSIS — N183 Chronic kidney disease, stage 3 (moderate): Secondary | ICD-10-CM | POA: Diagnosis not present

## 2016-01-02 LAB — CULTURE, BLOOD (ROUTINE X 2)
Culture: NO GROWTH
Culture: NO GROWTH

## 2016-01-07 ENCOUNTER — Other Ambulatory Visit: Payer: Self-pay | Admitting: *Deleted

## 2016-01-07 NOTE — Patient Outreach (Signed)
Spoke with [name], SW at facility re: discharge planning needs.  She reports patient is working with therapy but refuses to discuss Hospice services at this time. Patient has been verbally abusive to staff and family while at her stay at facility. She has agreed to remain at facility for 20 days if needed, tentative discharge date set for 01/21/16. Plan to return home with spouse who is her caregiver.   Patient has history of ETOH, Cancer, paraplegia. Patient is married, lives with spouse, who is caregiver.  Plan to follow for any [facility] care management needs upon discharge. [name]. [name], RN, BSN, [place] [facility] Care Coordinator [phone]

## 2016-01-12 DIAGNOSIS — K219 Gastro-esophageal reflux disease without esophagitis: Secondary | ICD-10-CM | POA: Diagnosis not present

## 2016-01-12 DIAGNOSIS — C7951 Secondary malignant neoplasm of bone: Secondary | ICD-10-CM | POA: Diagnosis not present

## 2016-01-12 DIAGNOSIS — M549 Dorsalgia, unspecified: Secondary | ICD-10-CM | POA: Diagnosis not present

## 2016-01-12 DIAGNOSIS — E875 Hyperkalemia: Secondary | ICD-10-CM | POA: Diagnosis not present

## 2016-01-12 DIAGNOSIS — M6281 Muscle weakness (generalized): Secondary | ICD-10-CM | POA: Diagnosis not present

## 2016-01-12 DIAGNOSIS — R531 Weakness: Secondary | ICD-10-CM | POA: Diagnosis not present

## 2016-01-12 DIAGNOSIS — I10 Essential (primary) hypertension: Secondary | ICD-10-CM | POA: Diagnosis not present

## 2016-01-12 DIAGNOSIS — C8510 Unspecified B-cell lymphoma, unspecified site: Secondary | ICD-10-CM | POA: Diagnosis not present

## 2016-01-12 DIAGNOSIS — N182 Chronic kidney disease, stage 2 (mild): Secondary | ICD-10-CM | POA: Diagnosis not present

## 2016-01-12 DIAGNOSIS — N390 Urinary tract infection, site not specified: Secondary | ICD-10-CM | POA: Diagnosis not present

## 2016-01-12 DIAGNOSIS — R1311 Dysphagia, oral phase: Secondary | ICD-10-CM | POA: Diagnosis not present

## 2016-01-12 DIAGNOSIS — N183 Chronic kidney disease, stage 3 (moderate): Secondary | ICD-10-CM | POA: Diagnosis not present

## 2016-01-12 DIAGNOSIS — C8330 Diffuse large B-cell lymphoma, unspecified site: Secondary | ICD-10-CM | POA: Diagnosis not present

## 2016-01-13 ENCOUNTER — Other Ambulatory Visit: Payer: Self-pay | Admitting: *Deleted

## 2016-01-13 NOTE — Patient Outreach (Signed)
[facility]) Care Management  01/13/2016  Ashley West December 07, 1940 [id number]   Met with [name], SW at facility. She states that patient will discharge home with Hospice services. No [facility] discharge needs assessed. Plan to Sign off, will sign back on if discharge plans change. [name]. [name], RN, BSN, [place] Acute Care Coordinator [phone]

## 2016-01-15 DIAGNOSIS — K219 Gastro-esophageal reflux disease without esophagitis: Secondary | ICD-10-CM | POA: Diagnosis not present

## 2016-01-15 DIAGNOSIS — N183 Chronic kidney disease, stage 3 (moderate): Secondary | ICD-10-CM | POA: Diagnosis not present

## 2016-01-15 DIAGNOSIS — C8510 Unspecified B-cell lymphoma, unspecified site: Secondary | ICD-10-CM | POA: Diagnosis not present

## 2016-01-15 DIAGNOSIS — C7951 Secondary malignant neoplasm of bone: Secondary | ICD-10-CM | POA: Diagnosis not present

## 2016-04-11 DEATH — deceased
